# Patient Record
Sex: Female | Born: 1935 | Race: White | Hispanic: No | Marital: Married | Smoking: Former smoker
Health system: Southern US, Community
[De-identification: ages and names within clinical notes are randomized; demographics above are authoritative.]

## PROBLEM LIST (undated history)

## (undated) DIAGNOSIS — T4145XA Adverse effect of unspecified anesthetic, initial encounter: Secondary | ICD-10-CM

## (undated) DIAGNOSIS — I4891 Unspecified atrial fibrillation: Secondary | ICD-10-CM

## (undated) DIAGNOSIS — I499 Cardiac arrhythmia, unspecified: Secondary | ICD-10-CM

## (undated) DIAGNOSIS — M2041 Other hammer toe(s) (acquired), right foot: Secondary | ICD-10-CM

## (undated) DIAGNOSIS — F329 Major depressive disorder, single episode, unspecified: Secondary | ICD-10-CM

## (undated) DIAGNOSIS — M1990 Unspecified osteoarthritis, unspecified site: Secondary | ICD-10-CM

## (undated) DIAGNOSIS — H3530 Unspecified macular degeneration: Secondary | ICD-10-CM

## (undated) DIAGNOSIS — T8859XA Other complications of anesthesia, initial encounter: Secondary | ICD-10-CM

## (undated) DIAGNOSIS — I10 Essential (primary) hypertension: Secondary | ICD-10-CM

## (undated) DIAGNOSIS — Z9889 Other specified postprocedural states: Secondary | ICD-10-CM

## (undated) DIAGNOSIS — F32A Depression, unspecified: Secondary | ICD-10-CM

## (undated) DIAGNOSIS — J189 Pneumonia, unspecified organism: Secondary | ICD-10-CM

## (undated) HISTORY — PX: BUNIONECTOMY: SHX129

## (undated) HISTORY — DX: Unspecified atrial fibrillation: I48.91

## (undated) HISTORY — PX: BREAST EXCISIONAL BIOPSY: SUR124

## (undated) HISTORY — PX: ABDOMINAL HYSTERECTOMY: SHX81

## (undated) HISTORY — PX: CATARACT EXTRACTION: SUR2

## (undated) HISTORY — PX: VARICOSE VEIN SURGERY: SHX832

## (undated) HISTORY — PX: LAPAROTOMY: SHX154

---

## 1990-04-16 HISTORY — PX: BREAST EXCISIONAL BIOPSY: SUR124

## 2001-10-30 ENCOUNTER — Ambulatory Visit (HOSPITAL_COMMUNITY): Admission: RE | Admit: 2001-10-30 | Discharge: 2001-10-30 | Payer: Self-pay | Admitting: Gastroenterology

## 2004-02-08 ENCOUNTER — Ambulatory Visit (HOSPITAL_COMMUNITY): Admission: RE | Admit: 2004-02-08 | Discharge: 2004-02-08 | Payer: Self-pay | Admitting: Orthopedic Surgery

## 2004-02-08 ENCOUNTER — Ambulatory Visit (HOSPITAL_BASED_OUTPATIENT_CLINIC_OR_DEPARTMENT_OTHER): Admission: RE | Admit: 2004-02-08 | Discharge: 2004-02-08 | Payer: Self-pay | Admitting: Orthopedic Surgery

## 2004-04-26 ENCOUNTER — Encounter: Admission: RE | Admit: 2004-04-26 | Discharge: 2004-04-26 | Payer: Self-pay | Admitting: Internal Medicine

## 2005-05-16 ENCOUNTER — Encounter: Admission: RE | Admit: 2005-05-16 | Discharge: 2005-05-16 | Payer: Self-pay | Admitting: Internal Medicine

## 2006-05-22 ENCOUNTER — Encounter: Admission: RE | Admit: 2006-05-22 | Discharge: 2006-05-22 | Payer: Self-pay | Admitting: Internal Medicine

## 2007-06-19 ENCOUNTER — Encounter: Admission: RE | Admit: 2007-06-19 | Discharge: 2007-06-19 | Payer: Self-pay | Admitting: Internal Medicine

## 2008-06-29 ENCOUNTER — Encounter: Admission: RE | Admit: 2008-06-29 | Discharge: 2008-06-29 | Payer: Self-pay | Admitting: Internal Medicine

## 2009-06-30 ENCOUNTER — Encounter: Admission: RE | Admit: 2009-06-30 | Discharge: 2009-06-30 | Payer: Self-pay | Admitting: Internal Medicine

## 2010-06-26 ENCOUNTER — Other Ambulatory Visit: Payer: Self-pay | Admitting: Internal Medicine

## 2010-06-26 DIAGNOSIS — Z1231 Encounter for screening mammogram for malignant neoplasm of breast: Secondary | ICD-10-CM

## 2010-07-05 ENCOUNTER — Ambulatory Visit
Admission: RE | Admit: 2010-07-05 | Discharge: 2010-07-05 | Disposition: A | Discharge status: Home / Self Care | Payer: No Typology Code available for payment source | Source: Ambulatory Visit | Attending: Internal Medicine | Admitting: Internal Medicine

## 2010-07-05 DIAGNOSIS — Z1231 Encounter for screening mammogram for malignant neoplasm of breast: Secondary | ICD-10-CM

## 2010-09-01 NOTE — Procedures (Signed)
[facility]  Patient:    Sheena Taylor, PASCALE Visit Number: [id number] MRN: [id number]          Service Type: END Location: ENDO Attending Physician:  Nelda Marseille Dictated by:   Petra Kuba, M.D. Proc. Date: 10/30/01 Admit Date:  10/30/2001 Discharge Date: 10/30/2001   CC:         Gwen Pounds, M.D.   Procedure Report  PROCEDURE:  Colonoscopy.  INDICATION:  Bright red blood per rectum, multiple GI complaints in a patient due for colonic screening.  Consent was signed after risks, benefits, methods, and options thoroughly discussed in the office.  MEDICATIONS:  Demerol 100, Versed 10.  DESCRIPTION OF PROCEDURE:  Rectal inspection was pertinent for a large external hemorrhoid.  Digital exam was negative.  Video pediatric adjustable colonoscope was inserted and with some difficulty due to a tortuous colon, we were able to advance to the cecum which required rolling her on her back and then on her right side and various abdominal pressures.  Other than some left-sided diverticula, no abnormalities were seen on insertion.  The cecum was identified by the appendiceal orifice and the ileocecal valve.  The scope was slowly withdrawn.  The prep was adequate.  There was some liquid stool that required washing and suctioning.  On slow withdrawal through the colon, other than some left diverticula and tortuosity, no abnormalities were seen. Specifically, no polyps, masses or other abnormalities.  Once back in the rectum, the scope was retroflexed, pertinent for some internal hemorrhoids. Straight visualization confirmed the hemorrhoids.  The scope was reinserted a short ways up the left side of the colon, air was suctioned and scope removed. The patient tolerated the procedure well.  There was no obvious immediate complication.  ENDOSCOPIC DIAGNOSES: 1. External greater than internal hemorrhoids. 2. Left diverticula and tortuosity. 3.  Otherwise within normal limits to the cecum.  PLAN: 1. Repeat screening in five years unless needed sooner p.r.n. 2. Hemorrhoidal therapy with creams or possibly seeing a surgeon. 3. Will probably use Diprivan on her next colonoscopy and continue work-up    with an EGD.  Please see that dictation for other work-up and plans. Dictated by:   Petra Kuba, M.D. Attending Physician:  Nelda Marseille DD:  10/30/01 TD:  11/04/01 Job: [phone] [id number]

## 2010-09-01 NOTE — Procedures (Signed)
[facility]  Patient:    Sheena Taylor, Sheena Taylor Visit Number: [id number] MRN: [id number]          Service Type: END Location: ENDO Attending Physician:  Nelda Marseille Dictated by:   Petra Kuba, M.D. Proc. Date: 10/30/01 Admit Date:  10/30/2001 Discharge Date: 10/30/2001   CC:         Gwen Pounds, M.D.   Procedure Report  PROCEDURE:  Esophagogastroduodenoscopy.  INDICATION:  Upper tract symptoms.  Consent was signed after risks, benefits, methods, and options thoroughly discussed in the office and before any sedation.  MEDICATIONS:  No additional medicines were used for this procedure.  DESCRIPTION OF PROCEDURE:  Video endoscope was inserted by direct vision.  The proximal mid esophagus was normal.  In the distal esophagus was a small hiatal hernia.  The scope passed into the stomach and advanced through a normal antrum, normal pylorus, into a normal duodenal bulb and around the C-loop to a normal second, third, and possibly even fourth part of the duodenum.  The scope was withdrawn back to the bulb.  No abnormalities were seen.  The scope was withdrawn back to the stomach and retroflexed.  High in the cardia, the hiatal hernia was confirmed.  Fundus, angularis, lesser and greater curve were normal except for some gastritis.  Straight visualization of the stomach confirmed the above.  Went ahead and readvanced through the third or fourth part of the duodenum.  No additional findings were seen.  Air was suctioned and scope removed.  Again, a good look at the esophagus was normal. The scope was withdrawn.  The patient tolerated the procedure well.  There was no obvious immediate complication.  ENDOSCOPIC DIAGNOSES: 1. Small hiatal hernia. 2. Tortuous esophagus not mentioned above. 3. Mild gastritis. 4. Otherwise normal EGD to the third or possibly fourth part of the duodenum.  PLAN: 1. Consideration of trial of pump inhibitors,  particularly if reflux or for    any aspirin-induced problems. 2. Consideration of an ultrasound, CT, or small-bowel follow-through p.r.n. if    symptoms continue. 3. Happy to see back p.r.n. or in 2-3 months to recheck symptoms and make sure    no further work-up plans are needed. Dictated by:   Petra Kuba, M.D. Attending Physician:  Nelda Marseille DD:  10/30/01 TD:  11/04/01 Job: [phone] [id number]

## 2010-09-01 NOTE — Op Note (Signed)
[name], [name]           ACCOUNT NO.:  [id number]   MEDICAL RECORD NO.:  [id number]          PATIENT TYPE:  AMB   LOCATION:  DSC                          FACILITY:  [facility]   PHYSICIAN:  Leonides Grills, M.D.     DATE OF BIRTH:  [birth year]   DATE OF PROCEDURE:  02/08/2004  DATE OF DISCHARGE:                                 OPERATIVE REPORT   PREOPERATIVE DIAGNOSES:  1.  Right second metatarsalgia.  2.  Right second hammertoe.   POSTOPERATIVE DIAGNOSES:  1.  Right second metatarsalgia.  2.  Right second hammertoe.   OPERATION PERFORMED:  1.  Right second Weil metatarsal shortening osteotomy.  2.  Right second toe amputation through metatarsophalangeal joint.  3.  Stress x-rays right foot.   SURGEON:  Leonides Grills, M.D.   ASSISTANT:  Lianne Cure, P.A.   ANESTHESIA:  General endotracheal tube. Popliteal block.   ESTIMATED BLOOD LOSS:  Minimal.   TOURNIQUET TIME:  Approximately half hour.   COMPLICATIONS:  None.  /DISPOSITION/>  Stable to PR.   INDICATIONS FOR PROCEDURE:  The patient is a 76 year old female with  longstanding forefoot pain as well as previous second toe reconstructive  procedure which has left herb with a dorsiflexion varus deformity to toe  that was rubbing in her shoe and interfering with her life.  After  explanation of the procedure, reconstruction versus amputation, the patient  has chosen amputation of the second toe with the above shortening metatarsal  osteotomy.  The patient  has consented for the above procedure.  All risks  which include infection, neurovascular injury, nonunion, malunion, hardware  irritation, hardware failure, persistent pain, worsening pain, were all  explained, questions were encouraged and answered.   DESCRIPTION OF PROCEDURE:  The patient was brought to the operating room and  placed in supine position after adequate general endotracheal tube  anesthesia was administered as well as Ancef 1 g IV piggyback and  a  popliteal block was done as well.  The right lower extremity was then  prepped and draped in a sterile manner over a proximally placed thigh  tourniquet.  Limb was gravity exsanguinated.  Tourniquet was elevated to 290  mmHg.  A racquet type incision was then made around the second toe with the  handle of the racquet so to speak, extended dorsally midline.  Once the toe  was dissected out, this was then amputated through the metatarsophalangeal  joint.  We then with a stack blade sagittal saw, created an osteotomy  parallel to the plantar aspect of the foot.  The head was then translated  proximally, approximately 3 to 4 mm.  This was then fixed with a 12 mm long  [facility] partially threaded screws.  This had excellent purchase and  maintenance of the correction.  The redundant bone distally and dorsally was  then rounded off with a rongeur as well as there was a dorsal osteophyte as  well over the second metatarsal head.  The wound was then copiously  irrigated with normal saline.  Tourniquet was deflated. Hemostasis was  obtained.  The wound was closed with 3-0  Vicryl.  The skin was closed with 4-  0 nylon.  Sterile dressing was applied.  Hard sole shoe was applied.  The  patient was stable to the PR.  Stress x-rays were obtained in the AP and  lateral planes and showed that the osteotomy was in the proper position,  shortening was excellent as was proper placement of the fixation.  There was  no gross motion at the osteotomy site.       PB/MEDQ  D:  02/08/2004  T:  02/08/2004  Job:  [id number]

## 2011-06-22 ENCOUNTER — Other Ambulatory Visit: Payer: Self-pay | Admitting: Internal Medicine

## 2011-06-22 DIAGNOSIS — Z1231 Encounter for screening mammogram for malignant neoplasm of breast: Secondary | ICD-10-CM

## 2011-07-19 ENCOUNTER — Ambulatory Visit
Admission: RE | Admit: 2011-07-19 | Discharge: 2011-07-19 | Disposition: A | Discharge status: Home / Self Care | Payer: Medicare Other | Source: Ambulatory Visit | Attending: Internal Medicine | Admitting: Internal Medicine

## 2011-07-19 DIAGNOSIS — Z1231 Encounter for screening mammogram for malignant neoplasm of breast: Secondary | ICD-10-CM | POA: Diagnosis not present

## 2011-09-06 DIAGNOSIS — H353190 Nonexudative age-related macular degeneration, unspecified eye, stage unspecified: Secondary | ICD-10-CM | POA: Diagnosis not present

## 2012-01-11 DIAGNOSIS — Z23 Encounter for immunization: Secondary | ICD-10-CM | POA: Diagnosis not present

## 2012-06-26 DIAGNOSIS — K5730 Diverticulosis of large intestine without perforation or abscess without bleeding: Secondary | ICD-10-CM | POA: Diagnosis not present

## 2012-06-26 DIAGNOSIS — Z1211 Encounter for screening for malignant neoplasm of colon: Secondary | ICD-10-CM | POA: Diagnosis not present

## 2012-06-26 DIAGNOSIS — K620 Anal polyp: Secondary | ICD-10-CM | POA: Diagnosis not present

## 2012-06-26 DIAGNOSIS — D126 Benign neoplasm of colon, unspecified: Secondary | ICD-10-CM | POA: Diagnosis not present

## 2012-07-15 ENCOUNTER — Other Ambulatory Visit: Payer: Self-pay

## 2012-07-15 DIAGNOSIS — Z1231 Encounter for screening mammogram for malignant neoplasm of breast: Secondary | ICD-10-CM

## 2012-08-22 DIAGNOSIS — M1990 Unspecified osteoarthritis, unspecified site: Secondary | ICD-10-CM | POA: Diagnosis not present

## 2012-08-22 DIAGNOSIS — G479 Sleep disorder, unspecified: Secondary | ICD-10-CM | POA: Diagnosis not present

## 2012-08-22 DIAGNOSIS — R634 Abnormal weight loss: Secondary | ICD-10-CM | POA: Diagnosis not present

## 2012-08-22 DIAGNOSIS — E785 Hyperlipidemia, unspecified: Secondary | ICD-10-CM | POA: Diagnosis not present

## 2012-08-22 DIAGNOSIS — F329 Major depressive disorder, single episode, unspecified: Secondary | ICD-10-CM | POA: Diagnosis not present

## 2012-08-22 DIAGNOSIS — E079 Disorder of thyroid, unspecified: Secondary | ICD-10-CM | POA: Diagnosis not present

## 2012-08-22 DIAGNOSIS — F411 Generalized anxiety disorder: Secondary | ICD-10-CM | POA: Diagnosis not present

## 2012-08-22 DIAGNOSIS — F3289 Other specified depressive episodes: Secondary | ICD-10-CM | POA: Diagnosis not present

## 2012-08-22 DIAGNOSIS — I10 Essential (primary) hypertension: Secondary | ICD-10-CM | POA: Diagnosis not present

## 2012-08-25 DIAGNOSIS — E079 Disorder of thyroid, unspecified: Secondary | ICD-10-CM | POA: Diagnosis not present

## 2012-08-26 ENCOUNTER — Ambulatory Visit
Admission: RE | Admit: 2012-08-26 | Discharge: 2012-08-26 | Disposition: A | Discharge status: Home / Self Care | Payer: Medicare Other | Source: Ambulatory Visit

## 2012-08-26 DIAGNOSIS — Z1231 Encounter for screening mammogram for malignant neoplasm of breast: Secondary | ICD-10-CM | POA: Diagnosis not present

## 2012-08-28 DIAGNOSIS — Z6831 Body mass index (BMI) 31.0-31.9, adult: Secondary | ICD-10-CM | POA: Diagnosis not present

## 2012-08-28 DIAGNOSIS — E785 Hyperlipidemia, unspecified: Secondary | ICD-10-CM | POA: Diagnosis not present

## 2012-08-28 DIAGNOSIS — E079 Disorder of thyroid, unspecified: Secondary | ICD-10-CM | POA: Diagnosis not present

## 2012-08-28 DIAGNOSIS — F3289 Other specified depressive episodes: Secondary | ICD-10-CM | POA: Diagnosis not present

## 2012-08-28 DIAGNOSIS — I10 Essential (primary) hypertension: Secondary | ICD-10-CM | POA: Diagnosis not present

## 2012-08-28 DIAGNOSIS — F329 Major depressive disorder, single episode, unspecified: Secondary | ICD-10-CM | POA: Diagnosis not present

## 2012-09-11 DIAGNOSIS — E079 Disorder of thyroid, unspecified: Secondary | ICD-10-CM | POA: Diagnosis not present

## 2012-10-28 DIAGNOSIS — H2510 Age-related nuclear cataract, unspecified eye: Secondary | ICD-10-CM | POA: Diagnosis not present

## 2012-10-28 DIAGNOSIS — H25019 Cortical age-related cataract, unspecified eye: Secondary | ICD-10-CM | POA: Diagnosis not present

## 2012-10-28 DIAGNOSIS — H18419 Arcus senilis, unspecified eye: Secondary | ICD-10-CM | POA: Diagnosis not present

## 2012-10-28 DIAGNOSIS — H35369 Drusen (degenerative) of macula, unspecified eye: Secondary | ICD-10-CM | POA: Diagnosis not present

## 2012-11-28 DIAGNOSIS — E079 Disorder of thyroid, unspecified: Secondary | ICD-10-CM | POA: Diagnosis not present

## 2012-11-28 DIAGNOSIS — I10 Essential (primary) hypertension: Secondary | ICD-10-CM | POA: Diagnosis not present

## 2012-11-28 DIAGNOSIS — E785 Hyperlipidemia, unspecified: Secondary | ICD-10-CM | POA: Diagnosis not present

## 2012-12-11 DIAGNOSIS — E785 Hyperlipidemia, unspecified: Secondary | ICD-10-CM | POA: Diagnosis not present

## 2012-12-11 DIAGNOSIS — E079 Disorder of thyroid, unspecified: Secondary | ICD-10-CM | POA: Diagnosis not present

## 2012-12-11 DIAGNOSIS — G479 Sleep disorder, unspecified: Secondary | ICD-10-CM | POA: Diagnosis not present

## 2012-12-11 DIAGNOSIS — F411 Generalized anxiety disorder: Secondary | ICD-10-CM | POA: Diagnosis not present

## 2012-12-11 DIAGNOSIS — I872 Venous insufficiency (chronic) (peripheral): Secondary | ICD-10-CM | POA: Diagnosis not present

## 2012-12-11 DIAGNOSIS — H3530 Unspecified macular degeneration: Secondary | ICD-10-CM | POA: Diagnosis not present

## 2012-12-11 DIAGNOSIS — I10 Essential (primary) hypertension: Secondary | ICD-10-CM | POA: Diagnosis not present

## 2012-12-11 DIAGNOSIS — Z0000 Encounter for general adult medical examination without abnormal findings: Secondary | ICD-10-CM | POA: Diagnosis not present

## 2012-12-12 DIAGNOSIS — Z1212 Encounter for screening for malignant neoplasm of rectum: Secondary | ICD-10-CM | POA: Diagnosis not present

## 2013-01-08 DIAGNOSIS — Z23 Encounter for immunization: Secondary | ICD-10-CM | POA: Diagnosis not present

## 2013-06-16 DIAGNOSIS — Z6831 Body mass index (BMI) 31.0-31.9, adult: Secondary | ICD-10-CM | POA: Diagnosis not present

## 2013-06-16 DIAGNOSIS — E785 Hyperlipidemia, unspecified: Secondary | ICD-10-CM | POA: Diagnosis not present

## 2013-06-16 DIAGNOSIS — I10 Essential (primary) hypertension: Secondary | ICD-10-CM | POA: Diagnosis not present

## 2013-06-16 DIAGNOSIS — E079 Disorder of thyroid, unspecified: Secondary | ICD-10-CM | POA: Diagnosis not present

## 2013-06-16 DIAGNOSIS — E669 Obesity, unspecified: Secondary | ICD-10-CM | POA: Diagnosis not present

## 2013-06-16 DIAGNOSIS — M1990 Unspecified osteoarthritis, unspecified site: Secondary | ICD-10-CM | POA: Diagnosis not present

## 2013-09-16 ENCOUNTER — Other Ambulatory Visit: Payer: Self-pay

## 2013-09-16 DIAGNOSIS — Z1231 Encounter for screening mammogram for malignant neoplasm of breast: Secondary | ICD-10-CM

## 2013-09-28 ENCOUNTER — Ambulatory Visit
Admission: RE | Admit: 2013-09-28 | Discharge: 2013-09-28 | Disposition: A | Discharge status: Home / Self Care | Payer: Medicare Other | Source: Ambulatory Visit

## 2013-09-28 DIAGNOSIS — Z1231 Encounter for screening mammogram for malignant neoplasm of breast: Secondary | ICD-10-CM

## 2013-10-28 DIAGNOSIS — H2510 Age-related nuclear cataract, unspecified eye: Secondary | ICD-10-CM | POA: Diagnosis not present

## 2013-10-28 DIAGNOSIS — H25019 Cortical age-related cataract, unspecified eye: Secondary | ICD-10-CM | POA: Diagnosis not present

## 2013-10-28 DIAGNOSIS — H35369 Drusen (degenerative) of macula, unspecified eye: Secondary | ICD-10-CM | POA: Diagnosis not present

## 2013-10-28 DIAGNOSIS — H353190 Nonexudative age-related macular degeneration, unspecified eye, stage unspecified: Secondary | ICD-10-CM | POA: Diagnosis not present

## 2013-12-10 DIAGNOSIS — R82998 Other abnormal findings in urine: Secondary | ICD-10-CM | POA: Diagnosis not present

## 2013-12-10 DIAGNOSIS — I10 Essential (primary) hypertension: Secondary | ICD-10-CM | POA: Diagnosis not present

## 2013-12-10 DIAGNOSIS — E785 Hyperlipidemia, unspecified: Secondary | ICD-10-CM | POA: Diagnosis not present

## 2013-12-17 DIAGNOSIS — IMO0002 Reserved for concepts with insufficient information to code with codable children: Secondary | ICD-10-CM | POA: Diagnosis not present

## 2013-12-17 DIAGNOSIS — Z23 Encounter for immunization: Secondary | ICD-10-CM | POA: Diagnosis not present

## 2013-12-17 DIAGNOSIS — G479 Sleep disorder, unspecified: Secondary | ICD-10-CM | POA: Diagnosis not present

## 2013-12-17 DIAGNOSIS — E669 Obesity, unspecified: Secondary | ICD-10-CM | POA: Diagnosis not present

## 2013-12-17 DIAGNOSIS — Z0000 Encounter for general adult medical examination without abnormal findings: Secondary | ICD-10-CM | POA: Diagnosis not present

## 2013-12-17 DIAGNOSIS — I10 Essential (primary) hypertension: Secondary | ICD-10-CM | POA: Diagnosis not present

## 2013-12-17 DIAGNOSIS — Z1331 Encounter for screening for depression: Secondary | ICD-10-CM | POA: Diagnosis not present

## 2013-12-17 DIAGNOSIS — M1710 Unilateral primary osteoarthritis, unspecified knee: Secondary | ICD-10-CM | POA: Diagnosis not present

## 2013-12-17 DIAGNOSIS — E079 Disorder of thyroid, unspecified: Secondary | ICD-10-CM | POA: Diagnosis not present

## 2013-12-17 DIAGNOSIS — M25569 Pain in unspecified knee: Secondary | ICD-10-CM | POA: Diagnosis not present

## 2013-12-17 DIAGNOSIS — E785 Hyperlipidemia, unspecified: Secondary | ICD-10-CM | POA: Diagnosis not present

## 2013-12-18 DIAGNOSIS — Z1212 Encounter for screening for malignant neoplasm of rectum: Secondary | ICD-10-CM | POA: Diagnosis not present

## 2013-12-31 DIAGNOSIS — Z23 Encounter for immunization: Secondary | ICD-10-CM | POA: Diagnosis not present

## 2014-02-11 DIAGNOSIS — K2270 Barrett's esophagus without dysplasia: Secondary | ICD-10-CM | POA: Diagnosis not present

## 2014-06-15 DIAGNOSIS — M25561 Pain in right knee: Secondary | ICD-10-CM | POA: Diagnosis not present

## 2014-06-15 DIAGNOSIS — E785 Hyperlipidemia, unspecified: Secondary | ICD-10-CM | POA: Diagnosis not present

## 2014-06-15 DIAGNOSIS — F329 Major depressive disorder, single episode, unspecified: Secondary | ICD-10-CM | POA: Diagnosis not present

## 2014-06-15 DIAGNOSIS — I10 Essential (primary) hypertension: Secondary | ICD-10-CM | POA: Diagnosis not present

## 2014-06-15 DIAGNOSIS — R946 Abnormal results of thyroid function studies: Secondary | ICD-10-CM | POA: Diagnosis not present

## 2014-06-15 DIAGNOSIS — Z1389 Encounter for screening for other disorder: Secondary | ICD-10-CM | POA: Diagnosis not present

## 2014-06-15 DIAGNOSIS — E669 Obesity, unspecified: Secondary | ICD-10-CM | POA: Diagnosis not present

## 2014-06-15 DIAGNOSIS — H3530 Unspecified macular degeneration: Secondary | ICD-10-CM | POA: Diagnosis not present

## 2014-06-15 DIAGNOSIS — Z6830 Body mass index (BMI) 30.0-30.9, adult: Secondary | ICD-10-CM | POA: Diagnosis not present

## 2014-06-15 DIAGNOSIS — M1990 Unspecified osteoarthritis, unspecified site: Secondary | ICD-10-CM | POA: Diagnosis not present

## 2014-06-18 DIAGNOSIS — Z6830 Body mass index (BMI) 30.0-30.9, adult: Secondary | ICD-10-CM | POA: Diagnosis not present

## 2014-06-18 DIAGNOSIS — M25561 Pain in right knee: Secondary | ICD-10-CM | POA: Diagnosis not present

## 2014-06-18 DIAGNOSIS — E669 Obesity, unspecified: Secondary | ICD-10-CM | POA: Diagnosis not present

## 2014-06-18 DIAGNOSIS — I10 Essential (primary) hypertension: Secondary | ICD-10-CM | POA: Diagnosis not present

## 2014-06-18 DIAGNOSIS — F329 Major depressive disorder, single episode, unspecified: Secondary | ICD-10-CM | POA: Diagnosis not present

## 2014-06-18 DIAGNOSIS — Z1389 Encounter for screening for other disorder: Secondary | ICD-10-CM | POA: Diagnosis not present

## 2014-06-18 DIAGNOSIS — E785 Hyperlipidemia, unspecified: Secondary | ICD-10-CM | POA: Diagnosis not present

## 2014-06-18 DIAGNOSIS — R946 Abnormal results of thyroid function studies: Secondary | ICD-10-CM | POA: Diagnosis not present

## 2014-06-18 DIAGNOSIS — M1990 Unspecified osteoarthritis, unspecified site: Secondary | ICD-10-CM | POA: Diagnosis not present

## 2014-06-18 DIAGNOSIS — H3530 Unspecified macular degeneration: Secondary | ICD-10-CM | POA: Diagnosis not present

## 2014-06-30 DIAGNOSIS — M179 Osteoarthritis of knee, unspecified: Secondary | ICD-10-CM | POA: Diagnosis not present

## 2014-06-30 DIAGNOSIS — I10 Essential (primary) hypertension: Secondary | ICD-10-CM | POA: Diagnosis not present

## 2014-06-30 DIAGNOSIS — Z6830 Body mass index (BMI) 30.0-30.9, adult: Secondary | ICD-10-CM | POA: Diagnosis not present

## 2014-07-19 DIAGNOSIS — M179 Osteoarthritis of knee, unspecified: Secondary | ICD-10-CM | POA: Diagnosis not present

## 2014-07-19 DIAGNOSIS — Z6830 Body mass index (BMI) 30.0-30.9, adult: Secondary | ICD-10-CM | POA: Diagnosis not present

## 2014-07-19 DIAGNOSIS — I10 Essential (primary) hypertension: Secondary | ICD-10-CM | POA: Diagnosis not present

## 2014-07-19 DIAGNOSIS — E785 Hyperlipidemia, unspecified: Secondary | ICD-10-CM | POA: Diagnosis not present

## 2014-09-28 DIAGNOSIS — Z6829 Body mass index (BMI) 29.0-29.9, adult: Secondary | ICD-10-CM | POA: Diagnosis not present

## 2014-09-28 DIAGNOSIS — M179 Osteoarthritis of knee, unspecified: Secondary | ICD-10-CM | POA: Diagnosis not present

## 2014-09-28 DIAGNOSIS — I10 Essential (primary) hypertension: Secondary | ICD-10-CM | POA: Diagnosis not present

## 2014-10-11 ENCOUNTER — Other Ambulatory Visit: Payer: Self-pay

## 2014-10-11 DIAGNOSIS — Z1231 Encounter for screening mammogram for malignant neoplasm of breast: Secondary | ICD-10-CM

## 2014-10-13 DIAGNOSIS — M1711 Unilateral primary osteoarthritis, right knee: Secondary | ICD-10-CM | POA: Diagnosis not present

## 2014-10-13 DIAGNOSIS — M25561 Pain in right knee: Secondary | ICD-10-CM | POA: Diagnosis not present

## 2014-10-29 DIAGNOSIS — J209 Acute bronchitis, unspecified: Secondary | ICD-10-CM | POA: Diagnosis not present

## 2014-10-29 DIAGNOSIS — J181 Lobar pneumonia, unspecified organism: Secondary | ICD-10-CM | POA: Diagnosis not present

## 2014-10-29 DIAGNOSIS — Z6829 Body mass index (BMI) 29.0-29.9, adult: Secondary | ICD-10-CM | POA: Diagnosis not present

## 2014-11-16 DIAGNOSIS — J189 Pneumonia, unspecified organism: Secondary | ICD-10-CM | POA: Diagnosis not present

## 2014-11-17 ENCOUNTER — Ambulatory Visit
Admission: RE | Admit: 2014-11-17 | Discharge: 2014-11-17 | Disposition: A | Discharge status: Home / Self Care | Payer: Medicare Other | Source: Ambulatory Visit

## 2014-11-17 DIAGNOSIS — Z1231 Encounter for screening mammogram for malignant neoplasm of breast: Secondary | ICD-10-CM

## 2014-11-19 DIAGNOSIS — M25561 Pain in right knee: Secondary | ICD-10-CM | POA: Diagnosis not present

## 2014-12-08 ENCOUNTER — Ambulatory Visit
Admission: RE | Admit: 2014-12-08 | Discharge: 2014-12-08 | Disposition: A | Discharge status: Home / Self Care | Payer: Medicare Other | Source: Ambulatory Visit

## 2014-12-08 DIAGNOSIS — Z1231 Encounter for screening mammogram for malignant neoplasm of breast: Secondary | ICD-10-CM | POA: Diagnosis not present

## 2014-12-22 DIAGNOSIS — N390 Urinary tract infection, site not specified: Secondary | ICD-10-CM | POA: Diagnosis not present

## 2014-12-22 DIAGNOSIS — I10 Essential (primary) hypertension: Secondary | ICD-10-CM | POA: Diagnosis not present

## 2014-12-22 DIAGNOSIS — E785 Hyperlipidemia, unspecified: Secondary | ICD-10-CM | POA: Diagnosis not present

## 2014-12-22 DIAGNOSIS — Z008 Encounter for other general examination: Secondary | ICD-10-CM | POA: Diagnosis not present

## 2014-12-23 DIAGNOSIS — H25013 Cortical age-related cataract, bilateral: Secondary | ICD-10-CM | POA: Diagnosis not present

## 2014-12-23 DIAGNOSIS — H3531 Nonexudative age-related macular degeneration: Secondary | ICD-10-CM | POA: Diagnosis not present

## 2014-12-23 DIAGNOSIS — H35363 Drusen (degenerative) of macula, bilateral: Secondary | ICD-10-CM | POA: Diagnosis not present

## 2014-12-23 DIAGNOSIS — H18413 Arcus senilis, bilateral: Secondary | ICD-10-CM | POA: Diagnosis not present

## 2014-12-23 DIAGNOSIS — H43813 Vitreous degeneration, bilateral: Secondary | ICD-10-CM | POA: Diagnosis not present

## 2014-12-23 DIAGNOSIS — H2513 Age-related nuclear cataract, bilateral: Secondary | ICD-10-CM | POA: Diagnosis not present

## 2014-12-28 DIAGNOSIS — R634 Abnormal weight loss: Secondary | ICD-10-CM | POA: Diagnosis not present

## 2014-12-28 DIAGNOSIS — M179 Osteoarthritis of knee, unspecified: Secondary | ICD-10-CM | POA: Diagnosis not present

## 2014-12-28 DIAGNOSIS — Z23 Encounter for immunization: Secondary | ICD-10-CM | POA: Diagnosis not present

## 2014-12-28 DIAGNOSIS — F329 Major depressive disorder, single episode, unspecified: Secondary | ICD-10-CM | POA: Diagnosis not present

## 2014-12-28 DIAGNOSIS — Z6829 Body mass index (BMI) 29.0-29.9, adult: Secondary | ICD-10-CM | POA: Diagnosis not present

## 2014-12-28 DIAGNOSIS — E785 Hyperlipidemia, unspecified: Secondary | ICD-10-CM | POA: Diagnosis not present

## 2014-12-28 DIAGNOSIS — N6019 Diffuse cystic mastopathy of unspecified breast: Secondary | ICD-10-CM | POA: Diagnosis not present

## 2014-12-28 DIAGNOSIS — R946 Abnormal results of thyroid function studies: Secondary | ICD-10-CM | POA: Diagnosis not present

## 2014-12-28 DIAGNOSIS — G479 Sleep disorder, unspecified: Secondary | ICD-10-CM | POA: Diagnosis not present

## 2014-12-28 DIAGNOSIS — I10 Essential (primary) hypertension: Secondary | ICD-10-CM | POA: Diagnosis not present

## 2014-12-28 DIAGNOSIS — Z0000 Encounter for general adult medical examination without abnormal findings: Secondary | ICD-10-CM | POA: Diagnosis not present

## 2014-12-28 DIAGNOSIS — F419 Anxiety disorder, unspecified: Secondary | ICD-10-CM | POA: Diagnosis not present

## 2015-01-04 ENCOUNTER — Inpatient Hospital Stay (HOSPITAL_COMMUNITY): Admission: RE | Admit: 2015-01-04 | Payer: Medicare Other | Source: Ambulatory Visit

## 2015-01-05 NOTE — H&P (Signed)
TOTAL KNEE ADMISSION H&P  Patient is being admitted for right total knee arthroplasty.  Subjective:  Chief Complaint:      Right knee primary OA / pain.  HPI: Sheena Taylor, 80 y.o. female, has a history of pain and functional disability in the right knee due to arthritis and has failed non-surgical conservative treatments for greater than 12 weeks to include NSAID's and/or analgesics, corticosteriod injections and activity modification.  Onset of symptoms was gradual, starting 1+ years ago with gradually worsening course since that time. The patient noted no past surgery on the right knee(s).  Patient currently rates pain in the right knee(s) at 7 out of 10 with activity. Patient has worsening of pain with activity and weight bearing, pain that interferes with activities of daily living, pain with passive range of motion, crepitus and joint swelling.  Patient has evidence of periarticular osteophytes and joint space narrowing by imaging studies.  There is no active infection.  Risks, benefits and expectations were discussed with the patient.  Risks including but not limited to the risk of anesthesia, blood clots, nerve damage, blood vessel damage, failure of the prosthesis, infection and up to and including death.  Patient understand the risks, benefits and expectations and wishes to proceed with surgery.   PCP: Precious Reel, MD  D/C Plans:      Home with HHPT  Post-op Meds:       No Rx given   Tranexamic Acid:      To be given - IV  Decadron:      Is to be given  FYI:     ASA post-op  Dilaudid PO & IV     Past Medical History  Diagnosis Date  . Complication of anesthesia     Dr. Winfred Leeds had problems putting spinal in approximately 10-15 years ago and had to have general   . History of laparotomy     for ectopic pregnancy  . H/O vein stripping     bilateral  . Hammer toe of right foot     with this had amputation right 2nd toe  . Hypertension   . Depression   . Macular  degeneration   . Arthritis   . S/P bunionectomy     bilateral and hammer toe-correction    Past Surgical History  Procedure Laterality Date  . Abdominal hysterectomy      partial  . Bunionectomy      bilateral with hammer toe and amputation 2nd right toe with this  . Laparotomy      for ruptured ectopic pregnancy  . Varicose vein surgery      bilateral    No prescriptions prior to admission   Allergies  Allergen Reactions  . Codeine Nausea And Vomiting    "goes crazy"    Social History  Substance Use Topics  . Smoking status: Current Some Day Smoker    Types: Cigarettes  . Smokeless tobacco: Not on file     Comment: smokes about once a week when having a mixed drink   . Alcohol Use: Yes     Comment: occassionally       Review of Systems  Constitutional: Negative.   HENT: Negative.   Eyes: Negative.   Respiratory: Negative.   Cardiovascular: Negative.   Gastrointestinal: Negative.   Genitourinary: Negative.   Musculoskeletal: Positive for joint pain.  Skin: Negative.   Neurological: Negative.   Endo/Heme/Allergies: Negative.   Psychiatric/Behavioral: Positive for depression.    Objective:  Physical  Exam  Constitutional: She is oriented to person, place, and time. She appears well-developed and well-nourished.  HENT:  Head: Normocephalic and atraumatic.  Eyes: Pupils are equal, round, and reactive to light.  Neck: Neck supple. No JVD present. No tracheal deviation present. No thyromegaly present.  Cardiovascular: Normal rate, regular rhythm and intact distal pulses.   Murmur heard. Respiratory: Effort normal and breath sounds normal. No stridor. No respiratory distress. She has no wheezes.  GI: Soft. There is no tenderness. There is no guarding.  Musculoskeletal:       Right knee: She exhibits decreased range of motion, swelling and bony tenderness. She exhibits no ecchymosis, no deformity, no laceration and no erythema. Tenderness found.  Lymphadenopathy:     She has no cervical adenopathy.  Neurological: She is alert and oriented to person, place, and time.  Skin: Skin is warm and dry.  Psychiatric: She has a normal mood and affect.     Imaging Review Plain radiographs demonstrate severe degenerative joint disease of the right knee(s). The overall alignment is neutral. The bone quality appears to be good for age and reported activity level.  Assessment/Plan:  End stage arthritis, right knee   The patient history, physical examination, clinical judgment of the provider and imaging studies are consistent with end stage degenerative joint disease of the right knee(s) and total knee arthroplasty is deemed medically necessary. The treatment options including medical management, injection therapy arthroscopy and arthroplasty were discussed at length. The risks and benefits of total knee arthroplasty were presented and reviewed. The risks due to aseptic loosening, infection, stiffness, patella tracking problems, thromboembolic complications and other imponderables were discussed. The patient acknowledged the explanation, agreed to proceed with the plan and consent was signed. Patient is being admitted for inpatient treatment for surgery, pain control, PT, OT, prophylactic antibiotics, VTE prophylaxis, progressive ambulation and ADL's and discharge planning. The patient is planning to be discharged home with home health services.     [place] Pugh Mickala Laton   PA-C  01/15/2015, 5:58 PM

## 2015-01-05 NOTE — Patient Instructions (Signed)
Sheena Taylor  01/05/2015   Your procedure is scheduled on: Monday 01/17/2015  Report to [facility]  Entrance take [place]  elevators to 3rd floor to  [place] at 0800 AM.  Call this number if you have problems the morning of surgery [phone]   Remember: ONLY 1 PERSON MAY GO WITH YOU TO SHORT STAY TO GET  READY MORNING OF [address].  Do not eat food or drink liquids :After Midnight.     Take these medicines the morning of surgery with A SIP OF WATER: none  DO NOT TAKE ANY DIABETIC MEDICATIONS DAY OF YOUR SURGERY                               You may not have any metal on your body including hair pins and              piercings  Do not wear jewelry, make-up, lotions, powders or perfumes, deodorant             Do not wear nail polish.  Do not shave  48 hours prior to surgery.              Men may shave face and neck.   Do not bring valuables to the hospital. [place].  Contacts, dentures or bridgework may not be worn into surgery.  Leave suitcase in the car. After surgery it may be brought to your room.     Patients discharged the day of surgery will not be allowed to drive home.  Name and phone number of your driver:  Special Instructions: N/A              Please read over the following fact sheets you were given: _____________________________________________________________________             [facility] - Preparing for Surgery Before surgery, you can play an important role.  Because skin is not sterile, your skin needs to be as free of germs as possible.  You can reduce the number of germs on your skin by washing with CHG (chlorahexidine gluconate) soap before surgery.  CHG is an antiseptic cleaner which kills germs and bonds with the skin to continue killing germs even after washing. Please DO NOT use if you have an allergy to CHG or antibacterial soaps.  If your skin becomes  reddened/irritated stop using the CHG and inform your nurse when you arrive at Short Stay. Do not shave (including legs and underarms) for at least 48 hours prior to the first CHG shower.  You may shave your face/neck. Please follow these instructions carefully:  1.  Shower with CHG Soap the night before surgery and the  morning of Surgery.  2.  If you choose to wash your hair, wash your hair first as usual with your  normal  shampoo.  3.  After you shampoo, rinse your hair and body thoroughly to remove the  shampoo.                           4.  Use CHG as you would any other liquid soap.  You can apply chg directly  to the skin and wash  Gently with a scrungie or clean washcloth.  5.  Apply the CHG Soap to your body ONLY FROM THE NECK DOWN.   Do not use on face/ open                           Wound or open sores. Avoid contact with eyes, ears mouth and genitals (private parts).                       Wash face,  Genitals (private parts) with your normal soap.             6.  Wash thoroughly, paying special attention to the area where your surgery  will be performed.  7.  Thoroughly rinse your body with warm water from the neck down.  8.  DO NOT shower/wash with your normal soap after using and rinsing off  the CHG Soap.                9.  Pat yourself dry with a clean towel.            10.  Wear clean pajamas.            11.  Place clean sheets on your bed the night of your first shower and do not  sleep with pets. Day of Surgery : Do not apply any lotions/deodorants the morning of surgery.  Please wear clean clothes to the hospital/surgery center.  FAILURE TO FOLLOW THESE INSTRUCTIONS MAY RESULT IN THE CANCELLATION OF YOUR SURGERY PATIENT SIGNATURE_________________________________  NURSE SIGNATURE__________________________________  ________________________________________________________________________   [name]  An incentive spirometer is a tool that  can help keep your lungs clear and active. This tool measures how well you are filling your lungs with each breath. Taking long deep breaths may help reverse or decrease the chance of developing breathing (pulmonary) problems (especially infection) following:  A long period of time when you are unable to move or be active. BEFORE THE PROCEDURE   If the spirometer includes an indicator to show your best effort, your nurse or respiratory therapist will set it to a desired goal.  If possible, sit up straight or lean slightly forward. Try not to slouch.  Hold the incentive spirometer in an upright position. INSTRUCTIONS FOR USE   Sit on the edge of your bed if possible, or sit up as far as you can in bed or on a chair.  Hold the incentive spirometer in an upright position.  Breathe out normally.  Place the mouthpiece in your mouth and seal your lips tightly around it.  Breathe in slowly and as deeply as possible, raising the piston or the ball toward the top of the column.  Hold your breath for 3-5 seconds or for as long as possible. Allow the piston or ball to fall to the bottom of the column.  Remove the mouthpiece from your mouth and breathe out normally.  Rest for a few seconds and repeat Steps 1 through 7 at least 10 times every 1-2 hours when you are awake. Take your time and take a few normal breaths between deep breaths.  The spirometer may include an indicator to show your best effort. Use the indicator as a goal to work toward during each repetition.  After each set of 10 deep breaths, practice coughing to be sure your lungs are clear. If you have an incision (the cut made at the time of surgery),  support your incision when coughing by placing a pillow or rolled up towels firmly against it. Once you are able to get out of bed, walk around indoors and cough well. You may stop using the incentive spirometer when instructed by your caregiver.  RISKS AND COMPLICATIONS  Take your  time so you do not get dizzy or light-headed.  If you are in pain, you may need to take or ask for pain medication before doing incentive spirometry. It is harder to take a deep breath if you are having pain. AFTER USE  Rest and breathe slowly and easily.  It can be helpful to keep track of a log of your progress. Your caregiver can provide you with a simple table to help with this. If you are using the spirometer at home, follow these instructions: [place] IF:   You are having difficultly using the spirometer.  You have trouble using the spirometer as often as instructed.  Your pain medication is not giving enough relief while using the spirometer.  You develop fever of 100.5 F (38.1 C) or higher. SEEK IMMEDIATE MEDICAL CARE IF:   You cough up bloody sputum that had not been present before.  You develop fever of 102 F (38.9 C) or greater.  You develop worsening pain at or near the incision site. MAKE SURE YOU:   Understand these instructions.  Will watch your condition.  Will get help right away if you are not doing well or get worse. Document Released: 08/13/2006 Document Revised: 06/25/2011 Document Reviewed: 10/14/2006 Exit[employer] Patient Information 2014 ExitCare, [place].   ________________________________________________________________________  WHAT IS A BLOOD TRANSFUSION? Blood Transfusion Information  A transfusion is the replacement of blood or some of its parts. Blood is made up of multiple cells which provide different functions.  Red blood cells carry oxygen and are used for blood loss replacement.  White blood cells fight against infection.  Platelets control bleeding.  Plasma helps clot blood.  Other blood products are available for specialized needs, such as hemophilia or other clotting disorders. BEFORE THE TRANSFUSION  Who gives blood for transfusions?   Healthy volunteers who are fully evaluated to make sure their blood is safe. This  is blood bank blood. Transfusion therapy is the safest it has ever been in the practice of medicine. Before blood is taken from a donor, a complete history is taken to make sure that person has no history of diseases nor engages in risky social behavior (examples are intravenous drug use or sexual activity with multiple partners). The donor's travel history is screened to minimize risk of transmitting infections, such as malaria. The donated blood is tested for signs of infectious diseases, such as HIV and hepatitis. The blood is then tested to be sure it is compatible with you in order to minimize the chance of a transfusion reaction. If you or a relative donates blood, this is often done in anticipation of surgery and is not appropriate for emergency situations. It takes many days to process the donated blood. RISKS AND COMPLICATIONS Although transfusion therapy is very safe and saves many lives, the main dangers of transfusion include:   Getting an infectious disease.  Developing a transfusion reaction. This is an allergic reaction to something in the blood you were given. Every precaution is taken to prevent this. The decision to have a blood transfusion has been considered carefully by your caregiver before blood is given. Blood is not given unless the benefits outweigh the risks. AFTER THE TRANSFUSION  Right after receiving a blood transfusion, you will usually feel much better and more energetic. This is especially true if your red blood cells have gotten low (anemic). The transfusion raises the level of the red blood cells which carry oxygen, and this usually causes an energy increase.  The nurse administering the transfusion will monitor you carefully for complications. HOME CARE INSTRUCTIONS  No special instructions are needed after a transfusion. You may find your energy is better. Speak with your caregiver about any limitations on activity for underlying diseases you may have. SEEK  MEDICAL CARE IF:   Your condition is not improving after your transfusion.  You develop redness or irritation at the intravenous (IV) site. SEEK IMMEDIATE MEDICAL CARE IF:  Any of the following symptoms occur over the next 12 hours:  Shaking chills.  You have a temperature by mouth above 102 F (38.9 C), not controlled by medicine.  Chest, back, or muscle pain.  People around you feel you are not acting correctly or are confused.  Shortness of breath or difficulty breathing.  Dizziness and fainting.  You get a rash or develop hives.  You have a decrease in urine output.  Your urine turns a dark color or changes to pink, red, or brown. Any of the following symptoms occur over the next 10 days:  You have a temperature by mouth above 102 F (38.9 C), not controlled by medicine.  Shortness of breath.  Weakness after normal activity.  The white part of the eye turns yellow (jaundice).  You have a decrease in the amount of urine or are urinating less often.  Your urine turns a dark color or changes to pink, red, or brown. Document Released: 03/30/2000 Document Revised: 06/25/2011 Document Reviewed: 11/17/2007 [facility] Patient Information 2014 [place], [place].  _______________________________________________________________________

## 2015-01-10 ENCOUNTER — Encounter (INDEPENDENT_AMBULATORY_CARE_PROVIDER_SITE_OTHER): Payer: Self-pay

## 2015-01-10 ENCOUNTER — Encounter (HOSPITAL_COMMUNITY)
Admission: RE | Admit: 2015-01-10 | Discharge: 2015-01-10 | Disposition: A | Discharge status: Home / Self Care | Payer: Medicare Other | Source: Ambulatory Visit | Attending: Orthopedic Surgery | Admitting: Orthopedic Surgery

## 2015-01-10 ENCOUNTER — Encounter (HOSPITAL_COMMUNITY): Payer: Self-pay

## 2015-01-10 DIAGNOSIS — M179 Osteoarthritis of knee, unspecified: Secondary | ICD-10-CM | POA: Diagnosis not present

## 2015-01-10 DIAGNOSIS — Z01818 Encounter for other preprocedural examination: Secondary | ICD-10-CM | POA: Insufficient documentation

## 2015-01-10 HISTORY — DX: Other specified postprocedural states: Z98.890

## 2015-01-10 HISTORY — DX: Unspecified macular degeneration: H35.30

## 2015-01-10 HISTORY — DX: Essential (primary) hypertension: I10

## 2015-01-10 HISTORY — DX: Adverse effect of unspecified anesthetic, initial encounter: T41.45XA

## 2015-01-10 HISTORY — DX: Major depressive disorder, single episode, unspecified: F32.9

## 2015-01-10 HISTORY — DX: Other complications of anesthesia, initial encounter: T88.59XA

## 2015-01-10 HISTORY — DX: Unspecified osteoarthritis, unspecified site: M19.90

## 2015-01-10 HISTORY — DX: Other hammer toe(s) (acquired), right foot: M20.41

## 2015-01-10 HISTORY — DX: Depression, unspecified: F32.A

## 2015-01-10 LAB — URINALYSIS, ROUTINE W REFLEX MICROSCOPIC
GLUCOSE, UA: NEGATIVE mg/dL
Hgb urine dipstick: NEGATIVE
KETONES UR: NEGATIVE mg/dL
NITRITE: NEGATIVE
PH: 5 (ref 5.0–8.0)
PROTEIN: NEGATIVE mg/dL
Specific Gravity, Urine: 1.018 (ref 1.005–1.030)
Urobilinogen, UA: 1 mg/dL (ref 0.0–1.0)

## 2015-01-10 LAB — APTT: APTT: 25 s (ref 24–37)

## 2015-01-10 LAB — SURGICAL PCR SCREEN
MRSA, PCR: NEGATIVE
STAPHYLOCOCCUS AUREUS: NEGATIVE

## 2015-01-10 LAB — URINE MICROSCOPIC-ADD ON

## 2015-01-10 LAB — PROTIME-INR
INR: 0.96 (ref 0.00–1.49)
PROTHROMBIN TIME: 13 s (ref 11.6–15.2)

## 2015-01-10 LAB — ABO/RH: ABO/RH(D): A NEG

## 2015-01-10 NOTE — Progress Notes (Addendum)
12/22/2014-Labs (CBC, CMP, LIPID,TSH, U/A, and Micro  U/A, and urine culture) on chart from [facility]. 12/28/2014-EKG from [facility] on chart. 11/16/2014-CXR from [facility] on chart.

## 2015-01-17 ENCOUNTER — Encounter (HOSPITAL_COMMUNITY): Payer: Self-pay | Admitting: *Deleted

## 2015-01-17 ENCOUNTER — Inpatient Hospital Stay (HOSPITAL_COMMUNITY): Payer: Medicare Other | Admitting: Registered Nurse

## 2015-01-17 ENCOUNTER — Inpatient Hospital Stay (HOSPITAL_COMMUNITY)
Admission: RE | Admit: 2015-01-17 | Discharge: 2015-01-19 | DRG: 470 | Disposition: A | Discharge status: Home Health | Payer: Medicare Other | Source: Ambulatory Visit | Attending: Orthopedic Surgery | Admitting: Orthopedic Surgery

## 2015-01-17 ENCOUNTER — Encounter (HOSPITAL_COMMUNITY)
Admission: RE | Disposition: A | Discharge status: Home Health | Payer: Self-pay | Source: Ambulatory Visit | Attending: Orthopedic Surgery

## 2015-01-17 DIAGNOSIS — I10 Essential (primary) hypertension: Secondary | ICD-10-CM | POA: Diagnosis present

## 2015-01-17 DIAGNOSIS — Z96651 Presence of right artificial knee joint: Secondary | ICD-10-CM

## 2015-01-17 DIAGNOSIS — M25561 Pain in right knee: Secondary | ICD-10-CM | POA: Diagnosis not present

## 2015-01-17 DIAGNOSIS — H3530 Unspecified macular degeneration: Secondary | ICD-10-CM | POA: Diagnosis present

## 2015-01-17 DIAGNOSIS — F329 Major depressive disorder, single episode, unspecified: Secondary | ICD-10-CM | POA: Diagnosis present

## 2015-01-17 DIAGNOSIS — Z6830 Body mass index (BMI) 30.0-30.9, adult: Secondary | ICD-10-CM

## 2015-01-17 DIAGNOSIS — M179 Osteoarthritis of knee, unspecified: Secondary | ICD-10-CM | POA: Diagnosis not present

## 2015-01-17 DIAGNOSIS — F17210 Nicotine dependence, cigarettes, uncomplicated: Secondary | ICD-10-CM | POA: Diagnosis present

## 2015-01-17 DIAGNOSIS — Z1212 Encounter for screening for malignant neoplasm of rectum: Secondary | ICD-10-CM | POA: Diagnosis not present

## 2015-01-17 DIAGNOSIS — E669 Obesity, unspecified: Secondary | ICD-10-CM | POA: Diagnosis present

## 2015-01-17 DIAGNOSIS — M1711 Unilateral primary osteoarthritis, right knee: Secondary | ICD-10-CM | POA: Diagnosis not present

## 2015-01-17 DIAGNOSIS — M659 Synovitis and tenosynovitis, unspecified: Secondary | ICD-10-CM | POA: Diagnosis present

## 2015-01-17 DIAGNOSIS — Z01812 Encounter for preprocedural laboratory examination: Secondary | ICD-10-CM | POA: Diagnosis not present

## 2015-01-17 DIAGNOSIS — Z96659 Presence of unspecified artificial knee joint: Secondary | ICD-10-CM

## 2015-01-17 HISTORY — DX: Presence of right artificial knee joint: Z96.651

## 2015-01-17 HISTORY — PX: TOTAL KNEE ARTHROPLASTY: SHX125

## 2015-01-17 LAB — TYPE AND SCREEN
ABO/RH(D): A NEG
ANTIBODY SCREEN: NEGATIVE

## 2015-01-17 SURGERY — ARTHROPLASTY, KNEE, TOTAL
Anesthesia: Spinal | Site: Knee | Laterality: Right

## 2015-01-17 MED ORDER — FERROUS SULFATE 325 (65 FE) MG PO TABS
325.0000 mg | ORAL_TABLET | Freq: Three times a day (TID) | ORAL | Status: DC
  Administered 2015-01-18 – 2015-01-19 (×3): 325 mg via ORAL
  Filled 2015-01-17 (×8): qty 1

## 2015-01-17 MED ORDER — METOCLOPRAMIDE HCL 10 MG PO TABS: 5.0000 mg | ORAL_TABLET | Freq: Three times a day (TID) | ORAL | Status: DC | PRN

## 2015-01-17 MED ORDER — METHOCARBAMOL 1000 MG/10ML IJ SOLN
500.0000 mg | Freq: Four times a day (QID) | INTRAVENOUS | Status: DC | PRN
  Filled 2015-01-17: qty 5

## 2015-01-17 MED ORDER — MAGNESIUM CITRATE PO SOLN: 1.0000 | Freq: Once | ORAL | Status: DC | PRN

## 2015-01-17 MED ORDER — DIPHENHYDRAMINE HCL 25 MG PO CAPS: 25.0000 mg | ORAL_CAPSULE | Freq: Four times a day (QID) | ORAL | Status: DC | PRN

## 2015-01-17 MED ORDER — HYDROMORPHONE HCL 2 MG PO TABS
2.0000 mg | ORAL_TABLET | ORAL | Status: DC
  Administered 2015-01-17: 2 mg via ORAL
  Administered 2015-01-17: 4 mg via ORAL
  Administered 2015-01-17: 2 mg via ORAL
  Administered 2015-01-18 – 2015-01-19 (×9): 4 mg via ORAL
  Filled 2015-01-17 (×5): qty 2
  Filled 2015-01-17: qty 1
  Filled 2015-01-17 (×5): qty 2
  Filled 2015-01-17: qty 1

## 2015-01-17 MED ORDER — PROPOFOL 10 MG/ML IV BOLUS
INTRAVENOUS | Status: AC
  Filled 2015-01-17: qty 20

## 2015-01-17 MED ORDER — KETOROLAC TROMETHAMINE 30 MG/ML IJ SOLN
INTRAMUSCULAR | Status: AC
  Filled 2015-01-17: qty 1

## 2015-01-17 MED ORDER — MIDAZOLAM HCL 5 MG/5ML IJ SOLN
INTRAMUSCULAR | Status: DC | PRN
  Administered 2015-01-17 (×2): 1 mg via INTRAVENOUS

## 2015-01-17 MED ORDER — KETOROLAC TROMETHAMINE 30 MG/ML IJ SOLN
INTRAMUSCULAR | Status: DC | PRN
  Administered 2015-01-17: 30 mg

## 2015-01-17 MED ORDER — CEFAZOLIN SODIUM-DEXTROSE 2-3 GM-% IV SOLR
2.0000 g | Freq: Four times a day (QID) | INTRAVENOUS | Status: AC
  Administered 2015-01-17 (×2): 2 g via INTRAVENOUS
  Filled 2015-01-17 (×2): qty 50

## 2015-01-17 MED ORDER — PRAVASTATIN SODIUM 20 MG PO TABS
20.0000 mg | ORAL_TABLET | Freq: Every day | ORAL | Status: DC
  Administered 2015-01-17 – 2015-01-19 (×3): 20 mg via ORAL
  Filled 2015-01-17 (×3): qty 1

## 2015-01-17 MED ORDER — BUPIVACAINE-EPINEPHRINE (PF) 0.25% -1:200000 IJ SOLN
INTRAMUSCULAR | Status: AC
Start: 2015-01-17 — End: 2015-01-17
  Filled 2015-01-17: qty 30

## 2015-01-17 MED ORDER — BUPIVACAINE-EPINEPHRINE (PF) 0.25% -1:200000 IJ SOLN
INTRAMUSCULAR | Status: DC | PRN
  Administered 2015-01-17: 30 mL

## 2015-01-17 MED ORDER — TRANEXAMIC ACID 1000 MG/10ML IV SOLN
1000.0000 mg | Freq: Once | INTRAVENOUS | Status: AC
  Administered 2015-01-17: 1000 mg via INTRAVENOUS
  Filled 2015-01-17: qty 10

## 2015-01-17 MED ORDER — MENTHOL 3 MG MT LOZG: 1.0000 | LOZENGE | OROMUCOSAL | Status: DC | PRN

## 2015-01-17 MED ORDER — MEPERIDINE HCL 50 MG/ML IJ SOLN: 6.2500 mg | INTRAMUSCULAR | Status: DC | PRN

## 2015-01-17 MED ORDER — ASPIRIN EC 325 MG PO TBEC
325.0000 mg | DELAYED_RELEASE_TABLET | Freq: Two times a day (BID) | ORAL | Status: DC
  Administered 2015-01-18 – 2015-01-19 (×3): 325 mg via ORAL
  Filled 2015-01-17 (×5): qty 1

## 2015-01-17 MED ORDER — ONDANSETRON HCL 4 MG/2ML IJ SOLN
INTRAMUSCULAR | Status: AC
  Filled 2015-01-17: qty 2

## 2015-01-17 MED ORDER — DEXAMETHASONE SODIUM PHOSPHATE 10 MG/ML IJ SOLN
INTRAMUSCULAR | Status: AC
Start: 2015-01-17 — End: 2015-01-17
  Filled 2015-01-17: qty 1

## 2015-01-17 MED ORDER — CEFAZOLIN SODIUM-DEXTROSE 2-3 GM-% IV SOLR
INTRAVENOUS | Status: AC
Start: 2015-01-17 — End: 2015-01-17
  Filled 2015-01-17: qty 50

## 2015-01-17 MED ORDER — METOCLOPRAMIDE HCL 5 MG/ML IJ SOLN: 5.0000 mg | Freq: Three times a day (TID) | INTRAMUSCULAR | Status: DC | PRN

## 2015-01-17 MED ORDER — CEFAZOLIN SODIUM-DEXTROSE 2-3 GM-% IV SOLR
2.0000 g | INTRAVENOUS | Status: AC
  Administered 2015-01-17: 2 g via INTRAVENOUS

## 2015-01-17 MED ORDER — DEXAMETHASONE SODIUM PHOSPHATE 10 MG/ML IJ SOLN
10.0000 mg | Freq: Once | INTRAMUSCULAR | Status: AC
  Administered 2015-01-17: 10 mg via INTRAVENOUS

## 2015-01-17 MED ORDER — CELECOXIB 200 MG PO CAPS
200.0000 mg | ORAL_CAPSULE | Freq: Two times a day (BID) | ORAL | Status: DC
  Administered 2015-01-17 – 2015-01-19 (×4): 200 mg via ORAL
  Filled 2015-01-17 (×5): qty 1

## 2015-01-17 MED ORDER — SODIUM CHLORIDE 0.9 % IJ SOLN
INTRAMUSCULAR | Status: AC
  Filled 2015-01-17: qty 50

## 2015-01-17 MED ORDER — METHOCARBAMOL 500 MG PO TABS
500.0000 mg | ORAL_TABLET | Freq: Four times a day (QID) | ORAL | Status: DC | PRN
  Administered 2015-01-17 – 2015-01-18 (×4): 500 mg via ORAL
  Filled 2015-01-17 (×5): qty 1

## 2015-01-17 MED ORDER — POLYETHYLENE GLYCOL 3350 17 G PO PACK
17.0000 g | PACK | Freq: Two times a day (BID) | ORAL | Status: DC
  Administered 2015-01-17 – 2015-01-19 (×4): 17 g via ORAL

## 2015-01-17 MED ORDER — DOCUSATE SODIUM 100 MG PO CAPS
100.0000 mg | ORAL_CAPSULE | Freq: Two times a day (BID) | ORAL | Status: DC
  Administered 2015-01-17 – 2015-01-19 (×4): 100 mg via ORAL

## 2015-01-17 MED ORDER — PROMETHAZINE HCL 25 MG/ML IJ SOLN: 6.2500 mg | INTRAMUSCULAR | Status: DC | PRN

## 2015-01-17 MED ORDER — FENTANYL CITRATE (PF) 100 MCG/2ML IJ SOLN: 25.0000 ug | INTRAMUSCULAR | Status: DC | PRN

## 2015-01-17 MED ORDER — HYDROCHLOROTHIAZIDE 25 MG PO TABS
25.0000 mg | ORAL_TABLET | Freq: Every day | ORAL | Status: DC
  Administered 2015-01-17 – 2015-01-19 (×3): 25 mg via ORAL
  Filled 2015-01-17 (×3): qty 1

## 2015-01-17 MED ORDER — MIRTAZAPINE 15 MG PO TABS
15.0000 mg | ORAL_TABLET | Freq: Every day | ORAL | Status: DC
  Administered 2015-01-17 – 2015-01-18 (×2): 15 mg via ORAL
  Filled 2015-01-17 (×3): qty 1

## 2015-01-17 MED ORDER — ONDANSETRON HCL 4 MG PO TABS: 4.0000 mg | ORAL_TABLET | Freq: Four times a day (QID) | ORAL | Status: DC | PRN

## 2015-01-17 MED ORDER — SODIUM CHLORIDE 0.9 % IV SOLN
INTRAVENOUS | Status: DC
  Administered 2015-01-17 – 2015-01-18 (×2): via INTRAVENOUS
  Filled 2015-01-17 (×6): qty 1000

## 2015-01-17 MED ORDER — ONDANSETRON HCL 4 MG/2ML IJ SOLN: 4.0000 mg | Freq: Four times a day (QID) | INTRAMUSCULAR | Status: DC | PRN

## 2015-01-17 MED ORDER — SODIUM CHLORIDE 0.9 % IR SOLN
Status: DC | PRN
  Administered 2015-01-17: 1000 mL

## 2015-01-17 MED ORDER — ALUM & MAG HYDROXIDE-SIMETH 200-200-20 MG/5ML PO SUSP: 30.0000 mL | ORAL | Status: DC | PRN

## 2015-01-17 MED ORDER — DEXAMETHASONE SODIUM PHOSPHATE 10 MG/ML IJ SOLN
10.0000 mg | Freq: Once | INTRAMUSCULAR | Status: AC
  Administered 2015-01-18: 10 mg via INTRAVENOUS
  Filled 2015-01-17: qty 1

## 2015-01-17 MED ORDER — ONDANSETRON HCL 4 MG/2ML IJ SOLN
INTRAMUSCULAR | Status: DC | PRN
  Administered 2015-01-17: 4 mg via INTRAVENOUS

## 2015-01-17 MED ORDER — LIDOCAINE HCL (CARDIAC) 20 MG/ML IV SOLN
INTRAVENOUS | Status: DC | PRN
  Administered 2015-01-17: 100 mg via INTRAVENOUS

## 2015-01-17 MED ORDER — HYDROMORPHONE HCL 1 MG/ML IJ SOLN
0.5000 mg | INTRAMUSCULAR | Status: DC | PRN
  Administered 2015-01-17 (×2): 0.5 mg via INTRAVENOUS
  Filled 2015-01-17 (×2): qty 1

## 2015-01-17 MED ORDER — MIDAZOLAM HCL 2 MG/2ML IJ SOLN
INTRAMUSCULAR | Status: AC
  Filled 2015-01-17: qty 4

## 2015-01-17 MED ORDER — LACTATED RINGERS IV SOLN
INTRAVENOUS | Status: DC
  Administered 2015-01-17: 11:00:00 via INTRAVENOUS
  Administered 2015-01-17: 1000 mL via INTRAVENOUS

## 2015-01-17 MED ORDER — LIDOCAINE HCL (CARDIAC) 20 MG/ML IV SOLN
INTRAVENOUS | Status: AC
  Filled 2015-01-17: qty 5

## 2015-01-17 MED ORDER — SODIUM CHLORIDE 0.9 % IJ SOLN
INTRAMUSCULAR | Status: DC | PRN
  Administered 2015-01-17: 30 mL

## 2015-01-17 MED ORDER — PHENOL 1.4 % MT LIQD
1.0000 | OROMUCOSAL | Status: DC | PRN
  Filled 2015-01-17: qty 177

## 2015-01-17 MED ORDER — BISACODYL 10 MG RE SUPP
10.0000 mg | Freq: Every day | RECTAL | Status: DC | PRN
Start: 2015-01-17 — End: 2015-01-19

## 2015-01-17 MED ORDER — FENTANYL CITRATE (PF) 100 MCG/2ML IJ SOLN
INTRAMUSCULAR | Status: AC
  Filled 2015-01-17: qty 2

## 2015-01-17 MED ORDER — FENTANYL CITRATE (PF) 100 MCG/2ML IJ SOLN
INTRAMUSCULAR | Status: DC | PRN
  Administered 2015-01-17: 25 ug via INTRAVENOUS

## 2015-01-17 MED ORDER — PROPOFOL 500 MG/50ML IV EMUL
INTRAVENOUS | Status: DC | PRN
  Administered 2015-01-17: 25 ug/kg/min via INTRAVENOUS

## 2015-01-17 SURGICAL SUPPLY — 53 items
BAG DECANTER FOR FLEXI CONT (MISCELLANEOUS) IMPLANT
BAG ZIPLOCK 12X15 (MISCELLANEOUS) IMPLANT
BANDAGE ELASTIC 6 VELCRO ST LF (GAUZE/BANDAGES/DRESSINGS) ×2 IMPLANT
BANDAGE ESMARK 6X9 LF (GAUZE/BANDAGES/DRESSINGS) ×1 IMPLANT
BLADE SAW SGTL 13.0X1.19X90.0M (BLADE) ×2 IMPLANT
BNDG ESMARK 6X9 LF (GAUZE/BANDAGES/DRESSINGS) ×2
BOWL SMART MIX CTS (DISPOSABLE) ×2 IMPLANT
CAPT KNEE TOTAL 3 ATTUNE ×2 IMPLANT
CEMENT HV SMART SET (Cement) ×4 IMPLANT
CUFF TOURN SGL QUICK 34 (TOURNIQUET CUFF) ×1
CUFF TRNQT CYL 34X4X40X1 (TOURNIQUET CUFF) ×1 IMPLANT
DECANTER SPIKE VIAL GLASS SM (MISCELLANEOUS) ×2 IMPLANT
DRAPE EXTREMITY T 121X128X90 (DRAPE) ×2 IMPLANT
DRAPE POUCH INSTRU U-SHP 10X18 (DRAPES) ×2 IMPLANT
DRAPE U-SHAPE 47X51 STRL (DRAPES) ×2 IMPLANT
DRSG AQUACEL AG ADV 3.5X10 (GAUZE/BANDAGES/DRESSINGS) ×2 IMPLANT
DURAPREP 26ML APPLICATOR (WOUND CARE) ×4 IMPLANT
ELECT REM PT RETURN 9FT ADLT (ELECTROSURGICAL) ×2
ELECTRODE REM PT RTRN 9FT ADLT (ELECTROSURGICAL) ×1 IMPLANT
FACESHIELD WRAPAROUND (MASK) ×10 IMPLANT
GLOVE BIOGEL PI IND STRL 7.5 (GLOVE) ×5 IMPLANT
GLOVE BIOGEL PI IND STRL 8.5 (GLOVE) ×1 IMPLANT
GLOVE BIOGEL PI INDICATOR 7.5 (GLOVE) ×5
GLOVE BIOGEL PI INDICATOR 8.5 (GLOVE) ×1
GLOVE ECLIPSE 8.0 STRL XLNG CF (GLOVE) ×2 IMPLANT
GLOVE ORTHO TXT STRL SZ7.5 (GLOVE) ×4 IMPLANT
GOWN SPEC L3 XXLG W/TWL (GOWN DISPOSABLE) ×2 IMPLANT
GOWN STRL REUS W/TWL LRG LVL3 (GOWN DISPOSABLE) ×6 IMPLANT
HANDPIECE INTERPULSE COAX TIP (DISPOSABLE) ×1
KIT BASIN OR (CUSTOM PROCEDURE TRAY) ×2 IMPLANT
LIQUID BAND (GAUZE/BANDAGES/DRESSINGS) ×2 IMPLANT
MANIFOLD NEPTUNE II (INSTRUMENTS) ×2 IMPLANT
NDL SAFETY ECLIPSE 18X1.5 (NEEDLE) ×1 IMPLANT
NEEDLE HYPO 18GX1.5 SHARP (NEEDLE) ×1
PACK TOTAL JOINT (CUSTOM PROCEDURE TRAY) ×2 IMPLANT
PEN SKIN MARKING BROAD (MISCELLANEOUS) ×2 IMPLANT
POSITIONER SURGICAL ARM (MISCELLANEOUS) ×2 IMPLANT
SET HNDPC FAN SPRY TIP SCT (DISPOSABLE) ×1 IMPLANT
SET PAD KNEE POSITIONER (MISCELLANEOUS) ×2 IMPLANT
SUCTION FRAZIER 12FR DISP (SUCTIONS) ×2 IMPLANT
SUT MNCRL AB 4-0 PS2 18 (SUTURE) ×2 IMPLANT
SUT VIC AB 1 CT1 36 (SUTURE) ×2 IMPLANT
SUT VIC AB 2-0 CT1 27 (SUTURE) ×3
SUT VIC AB 2-0 CT1 TAPERPNT 27 (SUTURE) ×3 IMPLANT
SUT VLOC 180 0 24IN GS25 (SUTURE) ×2 IMPLANT
SYR 50ML LL SCALE MARK (SYRINGE) ×2 IMPLANT
TOWEL OR 17X26 10 PK STRL BLUE (TOWEL DISPOSABLE) ×2 IMPLANT
TOWEL OR NON WOVEN STRL DISP B (DISPOSABLE) ×2 IMPLANT
TRAY FOLEY W/METER SILVER 14FR (SET/KITS/TRAYS/PACK) ×2 IMPLANT
TRAY FOLEY W/METER SILVER 16FR (SET/KITS/TRAYS/PACK) IMPLANT
WATER STERILE IRR 1500ML POUR (IV SOLUTION) ×2 IMPLANT
WRAP KNEE MAXI GEL POST OP (GAUZE/BANDAGES/DRESSINGS) ×2 IMPLANT
YANKAUER SUCT BULB TIP 10FT TU (MISCELLANEOUS) ×2 IMPLANT

## 2015-01-17 NOTE — Transfer of Care (Signed)
Immediate Anesthesia Transfer of Care Note  Patient: Sheena Taylor  Procedure(s) Performed: Procedure(s): TOTAL RIGHT KNEE ARTHROPLASTY (Right)  Patient Location: PACU  Anesthesia Type:Spinal  Level of Consciousness: awake, alert , oriented and patient cooperative  Airway & Oxygen Therapy: Patient Spontanous Breathing and Patient connected to face mask oxygen  Post-op Assessment: Report given to RN, Post -op Vital signs reviewed and stable and SAB level T11.    Post vital signs: Reviewed and stable  Last Vitals:  Filed Vitals:   01/17/15 0759  BP: 157/65  Pulse: 64  Temp: 36.5 C  Resp: 16    Complications: No apparent anesthesia complications

## 2015-01-17 NOTE — Op Note (Signed)
NAME:  Sheena Taylor                      MEDICAL RECORD NO.:  [id number]                             FACILITY:  [facility]      PHYSICIAN:  Pietro Cassis. Alvan Dame, M.D.  DATE OF BIRTH:  1935-03-13      DATE OF PROCEDURE:  01/17/2015                                     OPERATIVE REPORT         PREOPERATIVE DIAGNOSIS:  Right knee osteoarthritis.      POSTOPERATIVE DIAGNOSIS:  Right knee osteoarthritis.      FINDINGS:  The patient was noted to have complete loss of cartilage and   bone-on-bone arthritis with associated osteophytes in all three compartments of   the knee with a significant synovitis and associated effusion.      PROCEDURE:  Right total knee replacement.      COMPONENTS USED:  DePuy Attune rotating platform posterior stabilized knee   system, a size 6 femur, 7 tibia, size 8 mm PS AOX insert, and 38 anatomic patellar   button.      SURGEON:  Pietro Cassis. Alvan Dame, M.D.      ASSISTANT:  Danae Orleans, PA-C.      ANESTHESIA:  Spinal.      SPECIMENS:  None.      COMPLICATION:  None.      DRAINS:  None.  EBL: <50cc      TOURNIQUET TIME:   Total Tourniquet Time Documented: Thigh (Right) - 28 minutes Total: Thigh (Right) - 28 minutes  .      The patient was stable to the recovery room.      INDICATION FOR PROCEDURE:  Sheena Taylor is a 80 y.o. female patient of   mine.  The patient had been seen, evaluated, and treated conservatively in the   office with medication, activity modification, and injections.  The patient had   radiographic changes of bone-on-bone arthritis with endplate sclerosis and osteophytes noted.      The patient failed conservative measures including medication, injections, and activity modification, and at this point was ready for more definitive measures.   Based on the radiographic changes and failed conservative measures, the patient   decided to proceed with total knee replacement.  Risks of infection,   DVT, component failure, need for  revision surgery, postop course, and   expectations were all   discussed and reviewed.  Consent was obtained for benefit of pain   relief.      PROCEDURE IN DETAIL:  The patient was brought to the operative theater.   Once adequate anesthesia, preoperative antibiotics, 2 gm of Ancef, 1 gm of Tranexamic Acid, and 10 mg of Decadron administered, the patient was positioned supine with the right thigh tourniquet placed.  The  right lower extremity was prepped and draped in sterile fashion.  A time-   out was performed identifying the patient, planned procedure, and   extremity.      The right lower extremity was placed in the [facility] leg holder.  The leg was   exsanguinated, tourniquet elevated to 250 mmHg.  A midline incision was   made followed by  median parapatellar arthrotomy.  Following initial   exposure, attention was first directed to the patella.  Precut   measurement was noted to be 22 mm.  I resected down to 14 mm and used a   38 patellar button to restore patellar height as well as cover the cut   surface.      The lug holes were drilled and a metal shim was placed to protect the   patella from retractors and saw blades.      At this point, attention was now directed to the femur.  The femoral   canal was opened with a drill, irrigated to try to prevent fat emboli.  An   intramedullary rod was passed at 3 degrees valgus, 9 mm of bone was   resected off the distal femur.  Following this resection, the tibia was   subluxated anteriorly.  Using the extramedullary guide, 2 mm of bone was resected off   the proximal medial tibia.  We confirmed the gap would be   stable medially and laterally with a size 48mm insert as well as confirmed   the cut was perpendicular in the coronal plane, checking with an alignment rod.      Once this was done, I sized the femur to be a size 6 in the anterior-   posterior dimension, chose a standard component based on medial and   lateral dimension.   The size 6 rotation block was then pinned in   position anterior referenced using the C-clamp to set rotation.  The   anterior, posterior, and  chamfer cuts were made without difficulty nor   notching making certain that I was along the anterior cortex to help   with flexion gap stability.      The final box cut was made off the lateral aspect of distal femur.      At this point, the tibia was sized to be a size 7, the size 7 tray was   then pinned in position through the medial third of the tubercle,   drilled, and keel punched.  Trial reduction was now carried with a 6 femur,  7 tibia, a size 7 then size 8 mm insert, and the 38 patella botton.  The knee was brought to   extension, full extension with good flexion stability with the patella   tracking through the trochlea without application of pressure.  Given   all these findings, the trial components removed.  Final components were   opened and cement was mixed.  The knee was irrigated with normal saline   solution and pulse lavage.  The synovial lining was   then injected with 30cc of 0.25% Marcaine with epinephrine and 1 cc of Toradol plus 30cc of NS  total of 61 cc.      The knee was irrigated.  Final implants were then cemented onto clean and   dried cut surfaces of bone with the knee brought to extension with a size 8   mm trial insert.      Once the cement had fully cured, the excess cement was removed   throughout the knee.  I confirmed I was satisfied with the range of   motion and stability, and the final size 8 mm PS AOX insert was chosen.  It was   placed into the knee.      The tourniquet had been let down at 28 minutes.  No significant   hemostasis required.  The   extensor  mechanism was then reapproximated using #1 Vicryl and #0 V-lock sutures with the knee   in flexion.  The   remaining wound was closed with 2-0 Vicryl and running 4-0 Monocryl.   The knee was cleaned, dried, dressed sterilely using Dermabond and    Aquacel dressing.  The patient was then   brought to recovery room in stable condition, tolerating the procedure   well.   Please note that Physician Assistant, Danae Orleans, PA-C, was present for the entirety of the case, and was utilized for pre-operative positioning, peri-operative retractor management, general facilitation of the procedure.  He was also utilized for primary wound closure at the end of the case.              Pietro Cassis Alvan Dame, M.D.    01/17/2015 11:47 AM

## 2015-01-17 NOTE — Anesthesia Postprocedure Evaluation (Signed)
  Anesthesia Post-op Note  Patient: Sheena Taylor  Procedure(s) Performed: Procedure(s) (LRB): TOTAL RIGHT KNEE ARTHROPLASTY (Right)  Patient Location: PACU  Anesthesia Type: Spinal  Level of Consciousness: awake and alert   Airway and Oxygen Therapy: Patient Spontanous Breathing  Post-op Pain: mild  Post-op Assessment: Post-op Vital signs reviewed, Patient's Cardiovascular Status Stable, Respiratory Function Stable, Patent Airway and No signs of Nausea or vomiting  Last Vitals:  Filed Vitals:   01/17/15 1321  BP: 148/52  Pulse: 53  Temp: 36.7 C  Resp: 16    Post-op Vital Signs: stable   Complications: No apparent anesthesia complications

## 2015-01-17 NOTE — Plan of Care (Signed)
Problem: Consults Goal: Total Joint Replacement Patient Education See Patient Education Module for education specifics. Right total knee Goal: Diagnosis- Total Joint Replacement Right total knee

## 2015-01-17 NOTE — Anesthesia Preprocedure Evaluation (Addendum)
Anesthesia Evaluation  Patient identified by MRN, date of birth, ID band Patient awake    Reviewed: Allergy & Precautions, NPO status , Patient's Chart, lab work & pertinent test results  Airway Mallampati: II  TM Distance: >3 FB Neck ROM: Full    Dental no notable dental hx.    Pulmonary neg pulmonary ROS, Current Smoker,    Pulmonary exam normal breath sounds clear to auscultation       Cardiovascular hypertension, Pt. on medications negative cardio ROS Normal cardiovascular exam Rhythm:Regular Rate:Normal     Neuro/Psych negative neurological ROS  negative psych ROS   GI/Hepatic negative GI ROS, Neg liver ROS,   Endo/Other  negative endocrine ROS  Renal/GU negative Renal ROS  negative genitourinary   Musculoskeletal negative musculoskeletal ROS (+)   Abdominal   Peds negative pediatric ROS (+)  Hematology negative hematology ROS (+)   Anesthesia Other Findings   Reproductive/Obstetrics negative OB ROS                           Anesthesia Physical Anesthesia Plan  ASA: II  Anesthesia Plan: Spinal   Post-op Pain Management:    Induction:   Airway Management Planned: Simple Face Mask  Additional Equipment:   Intra-op Plan:   Post-operative Plan:   Informed Consent: I have reviewed the patients History and Physical, chart, labs and discussed the procedure including the risks, benefits and alternatives for the proposed anesthesia with the patient or authorized representative who has indicated his/her understanding and acceptance.   Dental advisory given  Plan Discussed with: CRNA  Anesthesia Plan Comments:         Anesthesia Quick Evaluation

## 2015-01-17 NOTE — Anesthesia Procedure Notes (Signed)
Procedure Name: MAC Date/Time: 01/17/2015 10:15 AM Performed by: Carleene Cooper A Pre-anesthesia Checklist: Patient identified, Emergency Drugs available, Patient being monitored, Suction available and Timeout performed Patient Re-evaluated:Patient Re-evaluated prior to inductionOxygen Delivery Method: Simple face mask Dental Injury: Teeth and Oropharynx as per pre-operative assessment

## 2015-01-17 NOTE — Interval H&P Note (Signed)
History and Physical Interval Note:  01/17/2015 9:35 AM  Sheena Taylor  has presented today for surgery, with the diagnosis of RIGHT KNEE OA   The various methods of treatment have been discussed with the patient and family. After consideration of risks, benefits and other options for treatment, the patient has consented to  Procedure(s): TOTAL RIGHT KNEE ARTHROPLASTY (Right) as a surgical intervention .  The patient's history has been reviewed, patient examined, no change in status, stable for surgery.  I have reviewed the patient's chart and labs.  Questions were answered to the patient's satisfaction.     Mauri Pole

## 2015-01-18 LAB — CBC
HCT: 33.7 % — ABNORMAL LOW (ref 36.0–46.0)
Hemoglobin: 11 g/dL — ABNORMAL LOW (ref 12.0–15.0)
MCH: 30.6 pg (ref 26.0–34.0)
MCHC: 32.6 g/dL (ref 30.0–36.0)
MCV: 93.6 fL (ref 78.0–100.0)
PLATELETS: 220 10*3/uL (ref 150–400)
RBC: 3.6 MIL/uL — AB (ref 3.87–5.11)
RDW: 13.3 % (ref 11.5–15.5)
WBC: 14.8 10*3/uL — ABNORMAL HIGH (ref 4.0–10.5)

## 2015-01-18 LAB — BASIC METABOLIC PANEL
Anion gap: 7 (ref 5–15)
BUN: 12 mg/dL (ref 6–20)
CALCIUM: 8.5 mg/dL — AB (ref 8.9–10.3)
CO2: 29 mmol/L (ref 22–32)
CREATININE: 0.57 mg/dL (ref 0.44–1.00)
Chloride: 105 mmol/L (ref 101–111)
GFR calc Af Amer: >60 mL/min (ref >60)
GLUCOSE: 125 mg/dL — AB (ref 65–99)
Potassium: 3.2 mmol/L — ABNORMAL LOW (ref 3.5–5.1)
SODIUM: 141 mmol/L (ref 135–145)

## 2015-01-18 MED ORDER — DOCUSATE SODIUM 100 MG PO CAPS: 100.0000 mg | ORAL_CAPSULE | Freq: Two times a day (BID) | ORAL | Status: DC

## 2015-01-18 MED ORDER — HYDROMORPHONE HCL 2 MG PO TABS: 2.0000 mg | ORAL_TABLET | ORAL | Status: DC | PRN

## 2015-01-18 MED ORDER — ASPIRIN 325 MG PO TBEC: 325.0000 mg | DELAYED_RELEASE_TABLET | Freq: Two times a day (BID) | ORAL | Status: AC

## 2015-01-18 MED ORDER — POLYETHYLENE GLYCOL 3350 17 G PO PACK: 17.0000 g | PACK | Freq: Two times a day (BID) | ORAL | Status: DC

## 2015-01-18 MED ORDER — TIZANIDINE HCL 4 MG PO TABS: 4.0000 mg | ORAL_TABLET | Freq: Four times a day (QID) | ORAL | Status: DC | PRN

## 2015-01-18 MED ORDER — FERROUS SULFATE 325 (65 FE) MG PO TABS: 325.0000 mg | ORAL_TABLET | Freq: Three times a day (TID) | ORAL | Status: DC

## 2015-01-18 NOTE — Discharge Instructions (Signed)

## 2015-01-18 NOTE — Evaluation (Signed)
Physical Therapy Evaluation Patient Details Name: Sheena Taylor MRN: [id number] DOB: 1934-08-16 Today's Date: 01/18/2015   History of Present Illness  s/p R TKA  Clinical Impression  Pt will benefit from PT to address deficits below; Pt moving well but with near LOC after amb 30', chair brought to pt, VSS; RN aware; may need another night/second day of PT to be safe to D/C home    Follow Up Recommendations Home health PT    Equipment Recommendations  Rolling walker with 5" wheels    Recommendations for Other Services       Precautions / Restrictions Precautions Precautions: Knee Restrictions Other Position/Activity Restrictions: WBAT      Mobility  Bed Mobility Overal bed mobility: Needs Assistance Bed Mobility: Supine to Sit     Supine to sit: Min assist     General bed mobility comments: assist with RLE  Transfers Overall transfer level: Needs assistance Equipment used: Rolling walker (2 wheeled) Transfers: Sit to/from Stand Sit to Stand: Min assist         General transfer comment: cues for hand placement  Ambulation/Gait Ambulation/Gait assistance: Min assist Ambulation Distance (Feet): 30 Feet Assistive device: Rolling walker (2 wheeled) Gait Pattern/deviations: Step-to pattern;Antalgic Gait velocity: pt with c/o dizziness, mildly diaphoretic--after back in chair BP 150/62, sats 96% on RA, HR 72-- RN notified   General Gait Details: cues for sequence, step length, safety;   Stairs            Wheelchair Mobility    Modified Rankin (Stroke Patients Only)       Balance                                             Pertinent Vitals/Pain Pain Assessment: 0-10 Pain Score: 5  Pain Location: right knee Pain Descriptors / Indicators: Sore Pain Intervention(s): Monitored during session;Limited activity within patient's tolerance;Repositioned    Home Living Family/patient expects to be discharged to:: Private  residence Living Arrangements: Spouse/significant other   Type of Home: House Home Access: Stairs to enter   Technical brewer of Steps: 1 Home Layout: Laundry or work area in [place]: Kasandra Knudsen - single point      Prior Function Level of Independence: Independent               Journalist, newspaper        Extremity/Trunk Assessment   Upper Extremity Assessment: Defer to OT evaluation           Lower Extremity Assessment: RLE deficits/detail RLE Deficits / Details: ankle WFL, knee extension and hip flexion 2/5       Communication   Communication: No difficulties  Cognition Arousal/Alertness: Awake/alert Behavior During Therapy: WFL for tasks assessed/performed Overall Cognitive Status: Within Functional Limits for tasks assessed                      General Comments      Exercises Total Joint Exercises Ankle Circles/Pumps: AROM;Both;10 reps Quad Sets: 10 reps;Both;AROM Heel Slides: AROM;AAROM;Right;10 reps      Assessment/Plan    PT Assessment Patient needs continued PT services  PT Diagnosis Difficulty walking   PT Problem List Decreased strength;Decreased range of motion;Decreased activity tolerance;Decreased mobility;Decreased knowledge of use of DME  PT Treatment Interventions DME instruction;Gait training;Functional mobility training;Therapeutic activities;Therapeutic exercise;Patient/family education   PT Goals (Current goals can  be found in the Care Plan section) Acute Rehab PT Goals Patient Stated Goal: return to independence PT Goal Formulation: With patient Time For Goal Achievement: 01/21/15 Potential to Achieve Goals: Good    Frequency 7X/week   Barriers to discharge        Co-evaluation               End of Session Equipment Utilized During Treatment: Gait belt Activity Tolerance: Treatment limited secondary to medical complications (Comment) Patient left: in chair;with call bell/phone within  reach;with family/visitor present           Time: 0940-1010 PT Time Calculation (min) (ACUTE ONLY): 30 min   Charges:   PT Evaluation $Initial PT Evaluation Tier I: 1 Procedure PT Treatments $Gait Training: 8-22 mins   PT G Codes:        Ferdinando Lodge 01/22/15, 10:27 AM

## 2015-01-18 NOTE — Progress Notes (Signed)
Patient ID: DANAYE SOBH, female   DOB: 04/04/1935, 80 y.o.   MRN: [id number] Subjective: 1 Day Post-Op Procedure(s) (LRB): TOTAL RIGHT KNEE ARTHROPLASTY (Right)    Patient reports pain as moderate or so.  Doing all right this am waiting for breakfast.  Woody in room with her this am  Objective:   VITALS:   Filed Vitals:   01/18/15 1000  BP: 124/48  Pulse: 59  Temp: 97.7 F (36.5 C)  Resp: 18    Neurovascular intact Incision: dressing C/D/I  LABS  Recent Labs  01/18/15 0510  HGB 11.0*  HCT 33.7*  WBC 14.8*  PLT 220     Recent Labs  01/18/15 0510  NA 141  K 3.2*  BUN 12  CREATININE 0.57  GLUCOSE 125*    No results for input(s): LABPT, INR in the last 72 hours.   Assessment/Plan: 1 Day Post-Op Procedure(s) (LRB): TOTAL RIGHT KNEE ARTHROPLASTY (Right)   Advance diet Up with therapy Discharge home with home health today and tomorrow  RTC in 2 weeks

## 2015-01-18 NOTE — Progress Notes (Signed)
   01/18/15 1400  PT Visit Information  Last PT Received On 01/18/15  Assistance Needed +1  History of Present Illness s/p R TKA  PT Time Calculation  PT Start Time (ACUTE ONLY) 1431  PT Stop Time (ACUTE ONLY) 1457  PT Time Calculation (min) (ACUTE ONLY) 26 min  Subjective Data  Patient Stated Goal return to independence  Precautions  Precautions Knee  Restrictions  Other Position/Activity Restrictions WBAT  Pain Assessment  Pain Assessment 0-10  Pain Score 5  Pain Location right knee  Pain Descriptors / Indicators Sore  Pain Intervention(s) Limited activity within patient's tolerance;Monitored during session;Premedicated before session;Ice applied  Cognition  Arousal/Alertness Awake/alert  Behavior During Therapy WFL for tasks assessed/performed  Overall Cognitive Status Within Functional Limits for tasks assessed  Transfers  Overall transfer level Needs assistance  Equipment used Rolling walker (2 wheeled)  Transfers Sit to/from Stand  Sit to Stand Min guard (x3)  General transfer comment cues for hand placement  Ambulation/Gait  Ambulation/Gait assistance Min guard  Ambulation Distance (Feet) 80 Feet (10)  Assistive device Rolling walker (2 wheeled)  Gait Pattern/deviations Step-to pattern;Antalgic  General Gait Details cues for sequence, step length, safety;   PT - End of Session  Equipment Utilized During Treatment Gait belt  Activity Tolerance Patient tolerated treatment well  Patient left in chair;with call bell/phone within reach;with nursing/sitter in room  Nurse Communication Mobility status  PT - Assessment/Plan  PT Plan Current plan remains appropriate  PT Frequency (ACUTE ONLY) 7X/week  Follow Up Recommendations Home health PT  PT equipment Rolling walker with 5" wheels  PT Goal Progression  Progress towards PT goals Progressing toward goals  Acute Rehab PT Goals  PT Goal Formulation With patient  Time For Goal Achievement 01/21/15  Potential to  Achieve Goals Good  PT General Charges  $$ ACUTE PT VISIT 1 Procedure  PT Treatments  $Gait Training 23-37 mins

## 2015-01-18 NOTE — Progress Notes (Signed)
OT Cancellation Note  Patient Details Name: NILI HONDA MRN: [id number] DOB: 11-08-1934   Cancelled Treatment:    Reason Eval/Treat Not Completed: Other (comment) (PT working with patient). Will re-attempt OT eval as able/time allows, possibly tomorrow.   Latoiya Maradiaga , MS, OTR/L, CLT Pager: [phone]  01/18/2015, 2:28 PM

## 2015-01-18 NOTE — Care Management Note (Addendum)
Case Management Note  Patient Details  Name: Sheena Taylor MRN: [id number] Date of Birth: September 16, 1934  Subjective/Objective:                  TOTAL RIGHT KNEE ARTHROPLASTY (Right)  Action/Plan: Discharge planning  Expected Discharge Date:  01/18/15               Expected Discharge Plan:  [place]  In-House Referral:     Discharge planning Services  CM Consult  Post Acute Care Choice:  Home Health Choice offered to:  Patient  DME Arranged:  3-N-1, Walker rolling DME Agency:  [place]:  PT [facility] Agency:     Status of Service:  Completed, signed off  Medicare Important Message Given:    Date Medicare IM Given:    Medicare IM give by:    Date Additional Medicare IM Given:    Additional Medicare Important Message give by:     If discussed at [place] of Stay Meetings, dates discussed:    Additional Comments: Utilization Review complete. CM met with pt in room to offer choice of home health agency.  Pt chooses [facility] to render HHPT.  [facility] and contact information verified by pt.  Referral called to [facility] rep.  CM called [facility] DME rep, Lecretia to please deliver the rolling walker and 3n1 to room prior to discharge.  No other CM needs were communicated.  Dellie Catholic, RN 01/18/2015, 10:24 AM

## 2015-01-19 DIAGNOSIS — Z1212 Encounter for screening for malignant neoplasm of rectum: Secondary | ICD-10-CM | POA: Diagnosis not present

## 2015-01-19 DIAGNOSIS — E669 Obesity, unspecified: Secondary | ICD-10-CM | POA: Diagnosis present

## 2015-01-19 LAB — BASIC METABOLIC PANEL
Anion gap: 6 (ref 5–15)
BUN: 12 mg/dL (ref 6–20)
CO2: 32 mmol/L (ref 22–32)
Calcium: 8.6 mg/dL — ABNORMAL LOW (ref 8.9–10.3)
Chloride: 104 mmol/L (ref 101–111)
Creatinine, Ser: 0.52 mg/dL (ref 0.44–1.00)
GFR calc Af Amer: >60 mL/min (ref >60)
GLUCOSE: 107 mg/dL — AB (ref 65–99)
POTASSIUM: 3.2 mmol/L — AB (ref 3.5–5.1)
Sodium: 142 mmol/L (ref 135–145)

## 2015-01-19 LAB — CBC
HCT: 30.3 % — ABNORMAL LOW (ref 36.0–46.0)
Hemoglobin: 10.1 g/dL — ABNORMAL LOW (ref 12.0–15.0)
MCH: 31.7 pg (ref 26.0–34.0)
MCHC: 33.3 g/dL (ref 30.0–36.0)
MCV: 95 fL (ref 78.0–100.0)
PLATELETS: 214 10*3/uL (ref 150–400)
RBC: 3.19 MIL/uL — AB (ref 3.87–5.11)
RDW: 13.7 % (ref 11.5–15.5)
WBC: 11.1 10*3/uL — ABNORMAL HIGH (ref 4.0–10.5)

## 2015-01-19 NOTE — Progress Notes (Signed)
Physical Therapy Treatment Patient Details Name: Sheena Taylor MRN: [id number] DOB: [birth year] Today's Date: 01/19/2015    History of Present Illness s/p R TKA    PT Comments    Pt making excellent progress with PT, will need f/u HHPT  Follow Up Recommendations  Home health PT     Equipment Recommendations  Rolling walker with 5" wheels    Recommendations for Other Services       Precautions / Restrictions Precautions Precautions: Knee Restrictions Other Position/Activity Restrictions: WBAT    Mobility  Bed Mobility               General bed mobility comments: in chair.   Transfers Overall transfer level: Needs assistance Equipment used: Rolling walker (2 wheeled) Transfers: Sit to/from Stand Sit to Stand: Min guard         General transfer comment: cues for hand placement.  Ambulation/Gait Ambulation/Gait assistance: Supervision Ambulation Distance (Feet): 100 Feet Assistive device: Rolling walker (2 wheeled) Gait Pattern/deviations: Step-to pattern;Step-through pattern;Antalgic     General Gait Details: cues for sequence, step length, safety;    Stairs Stairs: Yes Stairs assistance: Min assist Stair Management: One rail Right;Forwards;With walker Number of Stairs: 1 (x2) General stair comments: cues for safety and sequence  Wheelchair Mobility    Modified Rankin (Stroke Patients Only)       Balance                                    Cognition Arousal/Alertness: Awake/alert Behavior During Therapy: WFL for tasks assessed/performed Overall Cognitive Status: Within Functional Limits for tasks assessed                      Exercises Total Joint Exercises Ankle Circles/Pumps: AROM;Both;10 reps Quad Sets: 10 reps;Both;AROM Knee Flexion: AAROM;Right;10 reps;Seated Goniometric ROM: grossly 10 to 100* flexion in sitting    General Comments General comments (skin integrity, edema, etc.):        Pertinent Vitals/Pain Pain Assessment: 0-10 Pain Score: 3  Pain Location: right knee Pain Descriptors / Indicators: Sore Pain Intervention(s): Limited activity within patient's tolerance;Monitored during session;Premedicated before session;Ice applied    Home Living Family/patient expects to be discharged to:: Private residence Living Arrangements: Spouse/significant other   Type of Home: House Home Access: Stairs to enter   Home Layout: Laundry or work area in [place]: Kasandra Knudsen - single point;Bedside commode Additional Comments: BSC already taken home.    Prior Function            PT Goals (current goals can now be found in the care plan section) Acute Rehab PT Goals Patient Stated Goal: return to independence PT Goal Formulation: With patient Time For Goal Achievement: 01/21/15 Potential to Achieve Goals: Good Progress towards PT goals: Progressing toward goals    Frequency  7X/week    PT Plan Current plan remains appropriate    Co-evaluation             End of Session Equipment Utilized During Treatment: Gait belt Activity Tolerance: Patient tolerated treatment well Patient left: in chair;with call bell/phone within reach;with family/visitor present     Time: 9211-9417 PT Time Calculation (min) (ACUTE ONLY): 37 min  Charges:  $Gait Training: 8-22 mins $Therapeutic Exercise: 8-22 mins                    G Codes:  [facility] 01/19/2015, 11:48 AM

## 2015-01-19 NOTE — Progress Notes (Signed)
     Subjective: 2 Days Post-Op Procedure(s) (LRB): TOTAL RIGHT KNEE ARTHROPLASTY (Right)   Patient reports pain as mild, pain controlled. No events throughout the night. Pain issues from yesterday, better today.  Ready to be discharged home.   Objective:   VITALS:   Filed Vitals:   01/19/15 0542  BP: 123/45  Pulse: 65  Temp: 98.1 F (36.7 C)  Resp: 16    Dorsiflexion/Plantar flexion intact Incision: dressing C/D/I No cellulitis present Compartment soft  LABS  Recent Labs  01/18/15 0510 01/19/15 0433  HGB 11.0* 10.1*  HCT 33.7* 30.3*  WBC 14.8* 11.1*  PLT 220 214     Recent Labs  01/18/15 0510 01/19/15 0433  NA 141 142  K 3.2* 3.2*  BUN 12 12  CREATININE 0.57 0.52  GLUCOSE 125* 107*     Assessment/Plan: 2 Days Post-Op Procedure(s) (LRB): TOTAL RIGHT KNEE ARTHROPLASTY (Right) Up with therapy Discharge home with home health  Follow up in 2 weeks at [facility] Follow up with OLIN,Caoilainn Sacks D in 2 weeks.  Contact information:  [facility] [address] [place] [phone]    Obese (BMI 30-39.9) Estimated body mass index is 30.12 kg/(m^2) as calculated from the following:   Height as of this encounter: 5\' 3"  (1.6 m).   Weight as of this encounter: 77.111 kg (170 lb). Patient also counseled that weight may inhibit the healing process Patient counseled that losing weight will help with future health issues       [place] Pugh. Torry Istre   PAC  01/19/2015, 8:46 AM

## 2015-01-19 NOTE — Evaluation (Signed)
Occupational Therapy One Time Evaluation Patient Details Name: Sheena Taylor MRN: [id number] DOB: September 16, 1934 Today's Date: 01/19/2015    History of Present Illness s/p R TKA   Clinical Impression   Pt doing well with functional ADL but limited by pain at end of session 7/10. Had just finished PT right before OT arrived. Pt for d/c today. Pt and spouse verbalize understanding of all education and have no further questions.    Follow Up Recommendations  No OT follow up    Equipment Recommendations  None recommended by OT (3in1 already taken home.)    Recommendations for Other Services       Precautions / Restrictions Precautions Precautions: Knee Restrictions Other Position/Activity Restrictions: WBAT      Mobility Bed Mobility               General bed mobility comments: in chair.   Transfers Overall transfer level: Needs assistance Equipment used: Rolling walker (2 wheeled) Transfers: Sit to/from Stand Sit to Stand: Min guard         General transfer comment: cues for hand placement.    Balance                                            ADL Overall ADL's : Needs assistance/impaired Eating/Feeding: Independent;Sitting   Grooming: Wash/dry hands;Min guard;Standing   Upper Body Bathing: Set up;Sitting   Lower Body Bathing: Minimal assistance;Sit to/from stand   Upper Body Dressing : Set up;Sitting   Lower Body Dressing: Moderate assistance;Sit to/from stand   Toilet Transfer: Min guard;Ambulation;BSC;RW   Toileting- Water quality scientist and Hygiene: Min guard;Sit to/from stand         General ADL Comments: Pt has a tub and plans to sponge bathe until able to manage the tub. Did educate on tub transfer bench option and coverage if pt desires to shower and cant step over the tub yet. Spouse verbalizes understanding of tubbench option also. He assisted pt with LB dressing this am per his report and reports this went well.  Reviewed sequence for LB dressing and having walker in front of her when she stands to pull up clothing. Pt did well with transfer to 3in1 with min cues for turns with walker.      Vision     Perception     Praxis      Pertinent Vitals/Pain Pain Assessment: 0-10 Pain Score: 3  Pain Location: right knee Pain Descriptors / Indicators: Sore Pain Intervention(s): Limited activity within patient's tolerance;Monitored during session;Premedicated before session;Ice applied     Hand Dominance     Extremity/Trunk Assessment Upper Extremity Assessment Upper Extremity Assessment: Overall WFL for tasks assessed           Communication Communication Communication: No difficulties   Cognition Arousal/Alertness: Awake/alert Behavior During Therapy: WFL for tasks assessed/performed Overall Cognitive Status: Within Functional Limits for tasks assessed                     General Comments       Exercises       Shoulder Instructions      Home Living Family/patient expects to be discharged to:: Private residence Living Arrangements: Spouse/significant other   Type of Home: House Home Access: Stairs to enter [employer] of Steps: 1   Home Layout: Laundry or work area in basement  Bathroom Shower/Tub: Teacher, early years/pre: Standard     Home Equipment: Cane - single point;Bedside commode   Additional Comments: BSC already taken home.      Prior Functioning/Environment               OT Diagnosis: Generalized weakness   OT Problem List:     OT Treatment/Interventions:      OT Goals(Current goals can be found in the care plan section) Acute Rehab OT Goals Patient Stated Goal: return to independence OT Goal Formulation: With patient  OT Frequency:     Barriers to D/C:            Co-evaluation              End of Session Equipment Utilized During Treatment: Rolling walker  Activity Tolerance: Patient limited by  pain (increased pain at end of session. Pt due for pain meds. nursing made aware of pain level) Patient left: in chair;with call bell/phone within reach;with family/visitor present   Time: 3419-3790 OT Time Calculation (min): 18 min Charges:  OT General Charges $OT Visit: 1 Procedure OT Evaluation $Initial OT Evaluation Tier I: 1 Procedure G-Codes:    Jules Schick  [phone] 01/19/2015, 11:48 AM

## 2015-01-20 DIAGNOSIS — F329 Major depressive disorder, single episode, unspecified: Secondary | ICD-10-CM | POA: Diagnosis not present

## 2015-01-20 DIAGNOSIS — I10 Essential (primary) hypertension: Secondary | ICD-10-CM | POA: Diagnosis not present

## 2015-01-20 DIAGNOSIS — Z471 Aftercare following joint replacement surgery: Secondary | ICD-10-CM | POA: Diagnosis not present

## 2015-01-20 DIAGNOSIS — M1990 Unspecified osteoarthritis, unspecified site: Secondary | ICD-10-CM | POA: Diagnosis not present

## 2015-01-20 DIAGNOSIS — F17210 Nicotine dependence, cigarettes, uncomplicated: Secondary | ICD-10-CM | POA: Diagnosis not present

## 2015-01-20 DIAGNOSIS — H3530 Unspecified macular degeneration: Secondary | ICD-10-CM | POA: Diagnosis not present

## 2015-01-20 NOTE — Discharge Summary (Signed)
Physician Discharge Summary  Patient ID: Sheena Taylor MRN: [id number] DOB/AGE: March 09, 1935 80 y.o.  Admit date: 01/17/2015 Discharge date: 01/19/2015   Procedures:  Procedure(s) (LRB): TOTAL RIGHT KNEE ARTHROPLASTY (Right)  Attending Physician:  Dr. Paralee Cancel   Admission Diagnoses:   Right knee primary OA / pain  Discharge Diagnoses:  Principal Problem:   S/P right TKA Active Problems:   Obese  Past Medical History  Diagnosis Date  . Complication of anesthesia     Dr. Winfred Leeds had problems putting spinal in approximately 10-15 years ago and had to have general   . History of laparotomy     for ectopic pregnancy  . H/O vein stripping     bilateral  . Hammer toe of right foot     with this had amputation right 2nd toe  . Hypertension   . Depression   . Macular degeneration   . Arthritis   . S/P bunionectomy     bilateral and hammer toe-correction    HPI:    Sheena Taylor, 80 y.o. female, has a history of pain and functional disability in the right knee due to arthritis and has failed non-surgical conservative treatments for greater than 12 weeks to include NSAID's and/or analgesics, corticosteriod injections and activity modification. Onset of symptoms was gradual, starting 1+ years ago with gradually worsening course since that time. The patient noted no past surgery on the right knee(s). Patient currently rates pain in the right knee(s) at 7 out of 10 with activity. Patient has worsening of pain with activity and weight bearing, pain that interferes with activities of daily living, pain with passive range of motion, crepitus and joint swelling. Patient has evidence of periarticular osteophytes and joint space narrowing by imaging studies. There is no active infection. Risks, benefits and expectations were discussed with the patient. Risks including but not limited to the risk of anesthesia, blood clots, nerve damage, blood vessel damage, failure of the  prosthesis, infection and up to and including death. Patient understand the risks, benefits and expectations and wishes to proceed with surgery.   PCP: Precious Reel, MD   Discharged Condition: good  Hospital Course:  Patient underwent the above stated procedure on 01/17/2015. Patient tolerated the procedure well and brought to the recovery room in good condition and subsequently to the floor.  POD #1 BP: 124/48 ; Pulse: 59 ; Temp: 97.7 F (36.5 C) ; Resp: 18 Patient reports pain as moderate or so. Doing all right this am waiting for breakfast. Woody in room with her this am. Neurovascular intact and incision: dressing C/D/I  LABS  Basename    HGB  11.0  HCT  33.7   POD #2  BP: 123/45 ; Pulse: 65 ; Temp: 98.1 F (36.7 C) ; Resp: 16 Patient reports pain as mild, pain controlled. No events throughout the night. Pain issues from yesterday, better today. Ready to be discharged home.  Dorsiflexion/plantar flexion intact, incision: dressing C/D/I, no cellulitis present and compartment soft.   LABS  Basename    HGB  10.1  HCT  30.3    Discharge Exam: General appearance: alert, cooperative and no distress Extremities: Homans sign is negative, no sign of DVT, no edema, redness or tenderness in the calves or thighs and no ulcers, gangrene or trophic changes  Disposition: Home with follow up in 2 weeks   Follow-up Information    Follow up with Mauri Pole, MD. Schedule an appointment as soon as possible for a visit in  2 weeks.   Specialty:  Orthopedic Surgery   Contact information:   [address] [place] [address] [phone]       Follow up with [facility].   Why:  home health physical therapy   Contact information:   [address] [place]  [address] [phone]       Follow up with [facility].   Why:  rolling walker and 3n1   Contact information:   [address] [place]   [address] [phone]       Discharge Instructions    Call MD / Call 911    Complete by:  As directed   If you experience chest pain or shortness of breath, CALL 911 and be transported to the hospital emergency room.  If you develope a fever above 101 F, pus (white drainage) or increased drainage or redness at the wound, or calf pain, call your surgeon's office.     Change dressing    Complete by:  As directed   Maintain surgical dressing until follow up in the clinic. If the edges start to pull up, may reinforce with tape. If the dressing is no longer working, may remove and cover with gauze and tape, but must keep the area dry and clean.  Call with any questions or concerns.     Constipation Prevention    Complete by:  As directed   Drink plenty of fluids.  Prune juice may be helpful.  You may use a stool softener, such as Colace (over the counter) 100 mg twice a day.  Use MiraLax (over the counter) for constipation as needed.     Diet - low sodium heart healthy    Complete by:  As directed      Discharge instructions    Complete by:  As directed   Maintain surgical dressing until follow up in the clinic. If the edges start to pull up, may reinforce with tape. If the dressing is no longer working, may remove and cover with gauze and tape, but must keep the area dry and clean.  Follow up in 2 weeks at [facility]. Call with any questions or concerns.     Increase activity slowly as tolerated    Complete by:  As directed   Weight bearing as tolerated with assist device (walker, cane, etc) as directed, use it as long as suggested by your surgeon or therapist, typically at least 4-6 weeks.     TED hose    Complete by:  As directed   Use stockings (TED hose) for 2 weeks on both leg(s).  You may remove them at night for sleeping.             Medication List    STOP taking these medications        aspirin 81 MG tablet  Replaced by:  aspirin 325 MG EC tablet     traMADol 50  MG tablet  Commonly known as:  ULTRAM      TAKE these medications        aspirin 325 MG EC tablet  Take 1 tablet (325 mg total) by mouth 2 (two) times daily.     celecoxib 200 MG capsule  Commonly known as:  CELEBREX  Take 200 mg by mouth daily.     docusate sodium 100 MG capsule  Commonly known as:  COLACE  Take 1 capsule (100 mg total) by mouth 2 (two) times  daily.     ferrous sulfate 325 (65 FE) MG tablet  Take 1 tablet (325 mg total) by mouth 3 (three) times daily after meals.     hydrochlorothiazide 25 MG tablet  Commonly known as:  HYDRODIURIL  Take 25 mg by mouth daily.     HYDROmorphone 2 MG tablet  Commonly known as:  DILAUDID  Take 1-2 tablets (2-4 mg total) by mouth every 4 (four) hours as needed for severe pain.     Lutein 6 MG Tabs  Take 6 mg by mouth every evening.     mirtazapine 15 MG tablet  Commonly known as:  REMERON  Take 15 mg by mouth at bedtime.     polyethylene glycol packet  Commonly known as:  MIRALAX / GLYCOLAX  Take 17 g by mouth 2 (two) times daily.     pravastatin 20 MG tablet  Commonly known as:  PRAVACHOL  Take 20 mg by mouth daily.     PRESERVISION AREDS PO  Take 1 tablet by mouth 2 (two) times daily.     tiZANidine 4 MG tablet  Commonly known as:  ZANAFLEX  Take 1 tablet (4 mg total) by mouth every 6 (six) hours as needed for muscle spasms.     vitamin C 500 MG tablet  Commonly known as:  ASCORBIC ACID  Take 500 mg by mouth daily after breakfast.         Signed: West Pugh. Enio Hornback   PA-C  01/20/2015, 11:38 AM

## 2015-01-21 DIAGNOSIS — Z471 Aftercare following joint replacement surgery: Secondary | ICD-10-CM | POA: Diagnosis not present

## 2015-01-21 DIAGNOSIS — I10 Essential (primary) hypertension: Secondary | ICD-10-CM | POA: Diagnosis not present

## 2015-01-21 DIAGNOSIS — H3530 Unspecified macular degeneration: Secondary | ICD-10-CM | POA: Diagnosis not present

## 2015-01-21 DIAGNOSIS — F17210 Nicotine dependence, cigarettes, uncomplicated: Secondary | ICD-10-CM | POA: Diagnosis not present

## 2015-01-21 DIAGNOSIS — F329 Major depressive disorder, single episode, unspecified: Secondary | ICD-10-CM | POA: Diagnosis not present

## 2015-01-21 DIAGNOSIS — M1990 Unspecified osteoarthritis, unspecified site: Secondary | ICD-10-CM | POA: Diagnosis not present

## 2015-01-24 DIAGNOSIS — M1990 Unspecified osteoarthritis, unspecified site: Secondary | ICD-10-CM | POA: Diagnosis not present

## 2015-01-24 DIAGNOSIS — F17210 Nicotine dependence, cigarettes, uncomplicated: Secondary | ICD-10-CM | POA: Diagnosis not present

## 2015-01-24 DIAGNOSIS — H3530 Unspecified macular degeneration: Secondary | ICD-10-CM | POA: Diagnosis not present

## 2015-01-24 DIAGNOSIS — F329 Major depressive disorder, single episode, unspecified: Secondary | ICD-10-CM | POA: Diagnosis not present

## 2015-01-24 DIAGNOSIS — Z471 Aftercare following joint replacement surgery: Secondary | ICD-10-CM | POA: Diagnosis not present

## 2015-01-24 DIAGNOSIS — I10 Essential (primary) hypertension: Secondary | ICD-10-CM | POA: Diagnosis not present

## 2015-01-25 DIAGNOSIS — M1990 Unspecified osteoarthritis, unspecified site: Secondary | ICD-10-CM | POA: Diagnosis not present

## 2015-01-25 DIAGNOSIS — H3530 Unspecified macular degeneration: Secondary | ICD-10-CM | POA: Diagnosis not present

## 2015-01-25 DIAGNOSIS — F17210 Nicotine dependence, cigarettes, uncomplicated: Secondary | ICD-10-CM | POA: Diagnosis not present

## 2015-01-25 DIAGNOSIS — Z471 Aftercare following joint replacement surgery: Secondary | ICD-10-CM | POA: Diagnosis not present

## 2015-01-25 DIAGNOSIS — I10 Essential (primary) hypertension: Secondary | ICD-10-CM | POA: Diagnosis not present

## 2015-01-25 DIAGNOSIS — F329 Major depressive disorder, single episode, unspecified: Secondary | ICD-10-CM | POA: Diagnosis not present

## 2015-01-26 DIAGNOSIS — H3530 Unspecified macular degeneration: Secondary | ICD-10-CM | POA: Diagnosis not present

## 2015-01-26 DIAGNOSIS — M1990 Unspecified osteoarthritis, unspecified site: Secondary | ICD-10-CM | POA: Diagnosis not present

## 2015-01-26 DIAGNOSIS — Z471 Aftercare following joint replacement surgery: Secondary | ICD-10-CM | POA: Diagnosis not present

## 2015-01-26 DIAGNOSIS — F17210 Nicotine dependence, cigarettes, uncomplicated: Secondary | ICD-10-CM | POA: Diagnosis not present

## 2015-01-26 DIAGNOSIS — I10 Essential (primary) hypertension: Secondary | ICD-10-CM | POA: Diagnosis not present

## 2015-01-26 DIAGNOSIS — F329 Major depressive disorder, single episode, unspecified: Secondary | ICD-10-CM | POA: Diagnosis not present

## 2015-01-27 DIAGNOSIS — M1711 Unilateral primary osteoarthritis, right knee: Secondary | ICD-10-CM | POA: Diagnosis not present

## 2015-01-31 DIAGNOSIS — M1711 Unilateral primary osteoarthritis, right knee: Secondary | ICD-10-CM | POA: Diagnosis not present

## 2015-02-02 DIAGNOSIS — Z471 Aftercare following joint replacement surgery: Secondary | ICD-10-CM | POA: Diagnosis not present

## 2015-02-02 DIAGNOSIS — Z96651 Presence of right artificial knee joint: Secondary | ICD-10-CM | POA: Diagnosis not present

## 2015-02-03 DIAGNOSIS — M1711 Unilateral primary osteoarthritis, right knee: Secondary | ICD-10-CM | POA: Diagnosis not present

## 2015-02-08 DIAGNOSIS — M1711 Unilateral primary osteoarthritis, right knee: Secondary | ICD-10-CM | POA: Diagnosis not present

## 2015-02-10 DIAGNOSIS — M1711 Unilateral primary osteoarthritis, right knee: Secondary | ICD-10-CM | POA: Diagnosis not present

## 2015-02-15 DIAGNOSIS — M1711 Unilateral primary osteoarthritis, right knee: Secondary | ICD-10-CM | POA: Diagnosis not present

## 2015-02-17 DIAGNOSIS — M1711 Unilateral primary osteoarthritis, right knee: Secondary | ICD-10-CM | POA: Diagnosis not present

## 2015-02-22 DIAGNOSIS — M1711 Unilateral primary osteoarthritis, right knee: Secondary | ICD-10-CM | POA: Diagnosis not present

## 2015-02-24 DIAGNOSIS — M1711 Unilateral primary osteoarthritis, right knee: Secondary | ICD-10-CM | POA: Diagnosis not present

## 2015-03-01 DIAGNOSIS — M1711 Unilateral primary osteoarthritis, right knee: Secondary | ICD-10-CM | POA: Diagnosis not present

## 2015-03-03 DIAGNOSIS — M1711 Unilateral primary osteoarthritis, right knee: Secondary | ICD-10-CM | POA: Diagnosis not present

## 2015-03-07 DIAGNOSIS — M1711 Unilateral primary osteoarthritis, right knee: Secondary | ICD-10-CM | POA: Diagnosis not present

## 2015-03-09 DIAGNOSIS — Z471 Aftercare following joint replacement surgery: Secondary | ICD-10-CM | POA: Diagnosis not present

## 2015-03-09 DIAGNOSIS — Z96651 Presence of right artificial knee joint: Secondary | ICD-10-CM | POA: Diagnosis not present

## 2015-03-09 DIAGNOSIS — M1711 Unilateral primary osteoarthritis, right knee: Secondary | ICD-10-CM | POA: Diagnosis not present

## 2015-03-24 DIAGNOSIS — Z96651 Presence of right artificial knee joint: Secondary | ICD-10-CM | POA: Diagnosis not present

## 2015-03-24 DIAGNOSIS — Z471 Aftercare following joint replacement surgery: Secondary | ICD-10-CM | POA: Diagnosis not present

## 2015-04-20 DIAGNOSIS — Z96651 Presence of right artificial knee joint: Secondary | ICD-10-CM | POA: Diagnosis not present

## 2015-04-20 DIAGNOSIS — Z471 Aftercare following joint replacement surgery: Secondary | ICD-10-CM | POA: Diagnosis not present

## 2015-05-04 DIAGNOSIS — Z471 Aftercare following joint replacement surgery: Secondary | ICD-10-CM | POA: Diagnosis not present

## 2015-05-04 DIAGNOSIS — Z96651 Presence of right artificial knee joint: Secondary | ICD-10-CM | POA: Diagnosis not present

## 2015-05-19 DIAGNOSIS — Z471 Aftercare following joint replacement surgery: Secondary | ICD-10-CM | POA: Diagnosis not present

## 2015-05-19 DIAGNOSIS — M25461 Effusion, right knee: Secondary | ICD-10-CM | POA: Diagnosis not present

## 2015-05-19 DIAGNOSIS — Z96651 Presence of right artificial knee joint: Secondary | ICD-10-CM | POA: Diagnosis not present

## 2015-05-25 DIAGNOSIS — R0602 Shortness of breath: Secondary | ICD-10-CM | POA: Diagnosis not present

## 2015-05-25 DIAGNOSIS — M179 Osteoarthritis of knee, unspecified: Secondary | ICD-10-CM | POA: Diagnosis not present

## 2015-05-25 DIAGNOSIS — Z6829 Body mass index (BMI) 29.0-29.9, adult: Secondary | ICD-10-CM | POA: Diagnosis not present

## 2015-05-25 DIAGNOSIS — I10 Essential (primary) hypertension: Secondary | ICD-10-CM | POA: Diagnosis not present

## 2015-05-25 DIAGNOSIS — J209 Acute bronchitis, unspecified: Secondary | ICD-10-CM | POA: Diagnosis not present

## 2015-06-27 DIAGNOSIS — E668 Other obesity: Secondary | ICD-10-CM | POA: Diagnosis not present

## 2015-06-27 DIAGNOSIS — I10 Essential (primary) hypertension: Secondary | ICD-10-CM | POA: Diagnosis not present

## 2015-06-27 DIAGNOSIS — M179 Osteoarthritis of knee, unspecified: Secondary | ICD-10-CM | POA: Diagnosis not present

## 2015-06-27 DIAGNOSIS — Z6829 Body mass index (BMI) 29.0-29.9, adult: Secondary | ICD-10-CM | POA: Diagnosis not present

## 2015-06-27 DIAGNOSIS — R946 Abnormal results of thyroid function studies: Secondary | ICD-10-CM | POA: Diagnosis not present

## 2015-06-27 DIAGNOSIS — F329 Major depressive disorder, single episode, unspecified: Secondary | ICD-10-CM | POA: Diagnosis not present

## 2015-06-27 DIAGNOSIS — Z1389 Encounter for screening for other disorder: Secondary | ICD-10-CM | POA: Diagnosis not present

## 2015-06-27 DIAGNOSIS — E784 Other hyperlipidemia: Secondary | ICD-10-CM | POA: Diagnosis not present

## 2015-11-04 ENCOUNTER — Other Ambulatory Visit: Payer: Self-pay | Admitting: Internal Medicine

## 2015-11-04 DIAGNOSIS — Z1231 Encounter for screening mammogram for malignant neoplasm of breast: Secondary | ICD-10-CM

## 2015-12-09 ENCOUNTER — Ambulatory Visit
Admission: RE | Admit: 2015-12-09 | Discharge: 2015-12-09 | Disposition: A | Discharge status: Home / Self Care | Payer: Medicare Other | Source: Ambulatory Visit | Attending: Internal Medicine | Admitting: Internal Medicine

## 2015-12-09 DIAGNOSIS — Z1231 Encounter for screening mammogram for malignant neoplasm of breast: Secondary | ICD-10-CM

## 2016-01-02 DIAGNOSIS — R8299 Other abnormal findings in urine: Secondary | ICD-10-CM | POA: Diagnosis not present

## 2016-01-02 DIAGNOSIS — E784 Other hyperlipidemia: Secondary | ICD-10-CM | POA: Diagnosis not present

## 2016-01-02 DIAGNOSIS — I10 Essential (primary) hypertension: Secondary | ICD-10-CM | POA: Diagnosis not present

## 2016-01-02 DIAGNOSIS — N390 Urinary tract infection, site not specified: Secondary | ICD-10-CM | POA: Diagnosis not present

## 2016-01-09 DIAGNOSIS — F418 Other specified anxiety disorders: Secondary | ICD-10-CM | POA: Diagnosis not present

## 2016-01-09 DIAGNOSIS — I872 Venous insufficiency (chronic) (peripheral): Secondary | ICD-10-CM | POA: Diagnosis not present

## 2016-01-09 DIAGNOSIS — E668 Other obesity: Secondary | ICD-10-CM | POA: Diagnosis not present

## 2016-01-09 DIAGNOSIS — Z1389 Encounter for screening for other disorder: Secondary | ICD-10-CM | POA: Diagnosis not present

## 2016-01-09 DIAGNOSIS — D692 Other nonthrombocytopenic purpura: Secondary | ICD-10-CM | POA: Diagnosis not present

## 2016-01-09 DIAGNOSIS — Z1212 Encounter for screening for malignant neoplasm of rectum: Secondary | ICD-10-CM | POA: Diagnosis not present

## 2016-01-09 DIAGNOSIS — Z0000 Encounter for general adult medical examination without abnormal findings: Secondary | ICD-10-CM | POA: Diagnosis not present

## 2016-01-09 DIAGNOSIS — I10 Essential (primary) hypertension: Secondary | ICD-10-CM | POA: Diagnosis not present

## 2016-01-09 DIAGNOSIS — E784 Other hyperlipidemia: Secondary | ICD-10-CM | POA: Diagnosis not present

## 2016-01-09 DIAGNOSIS — Z6831 Body mass index (BMI) 31.0-31.9, adult: Secondary | ICD-10-CM | POA: Diagnosis not present

## 2016-01-09 DIAGNOSIS — Z23 Encounter for immunization: Secondary | ICD-10-CM | POA: Diagnosis not present

## 2016-01-09 DIAGNOSIS — R946 Abnormal results of thyroid function studies: Secondary | ICD-10-CM | POA: Diagnosis not present

## 2016-01-09 DIAGNOSIS — F329 Major depressive disorder, single episode, unspecified: Secondary | ICD-10-CM | POA: Diagnosis not present

## 2016-03-01 DIAGNOSIS — H35363 Drusen (degenerative) of macula, bilateral: Secondary | ICD-10-CM | POA: Diagnosis not present

## 2016-03-12 DIAGNOSIS — R946 Abnormal results of thyroid function studies: Secondary | ICD-10-CM | POA: Diagnosis not present

## 2016-07-09 DIAGNOSIS — N182 Chronic kidney disease, stage 2 (mild): Secondary | ICD-10-CM | POA: Diagnosis not present

## 2016-07-09 DIAGNOSIS — Z6821 Body mass index (BMI) 21.0-21.9, adult: Secondary | ICD-10-CM | POA: Diagnosis not present

## 2016-07-09 DIAGNOSIS — I129 Hypertensive chronic kidney disease with stage 1 through stage 4 chronic kidney disease, or unspecified chronic kidney disease: Secondary | ICD-10-CM | POA: Diagnosis not present

## 2016-07-09 DIAGNOSIS — R946 Abnormal results of thyroid function studies: Secondary | ICD-10-CM | POA: Diagnosis not present

## 2016-07-09 DIAGNOSIS — E784 Other hyperlipidemia: Secondary | ICD-10-CM | POA: Diagnosis not present

## 2016-07-09 DIAGNOSIS — F3289 Other specified depressive episodes: Secondary | ICD-10-CM | POA: Diagnosis not present

## 2016-07-09 DIAGNOSIS — H3530 Unspecified macular degeneration: Secondary | ICD-10-CM | POA: Diagnosis not present

## 2016-07-09 DIAGNOSIS — I10 Essential (primary) hypertension: Secondary | ICD-10-CM | POA: Diagnosis not present

## 2016-07-09 DIAGNOSIS — R7309 Other abnormal glucose: Secondary | ICD-10-CM | POA: Diagnosis not present

## 2016-07-09 DIAGNOSIS — E668 Other obesity: Secondary | ICD-10-CM | POA: Diagnosis not present

## 2016-07-09 DIAGNOSIS — R42 Dizziness and giddiness: Secondary | ICD-10-CM | POA: Diagnosis not present

## 2016-07-10 ENCOUNTER — Ambulatory Visit (HOSPITAL_COMMUNITY)
Admission: RE | Admit: 2016-07-10 | Discharge: 2016-07-10 | Disposition: A | Discharge status: Home / Self Care | Payer: Medicare Other | Source: Ambulatory Visit | Attending: Vascular Surgery | Admitting: Vascular Surgery

## 2016-07-10 ENCOUNTER — Other Ambulatory Visit (HOSPITAL_COMMUNITY): Payer: Self-pay | Admitting: Internal Medicine

## 2016-07-10 DIAGNOSIS — R42 Dizziness and giddiness: Secondary | ICD-10-CM

## 2016-10-29 ENCOUNTER — Other Ambulatory Visit: Payer: Self-pay | Admitting: Internal Medicine

## 2016-10-29 DIAGNOSIS — Z1231 Encounter for screening mammogram for malignant neoplasm of breast: Secondary | ICD-10-CM

## 2016-12-11 ENCOUNTER — Ambulatory Visit
Admission: RE | Admit: 2016-12-11 | Discharge: 2016-12-11 | Disposition: A | Discharge status: Home / Self Care | Payer: Medicare Other | Source: Ambulatory Visit | Attending: Internal Medicine | Admitting: Internal Medicine

## 2016-12-11 DIAGNOSIS — Z1231 Encounter for screening mammogram for malignant neoplasm of breast: Secondary | ICD-10-CM

## 2017-01-03 DIAGNOSIS — N390 Urinary tract infection, site not specified: Secondary | ICD-10-CM | POA: Diagnosis not present

## 2017-01-03 DIAGNOSIS — E784 Other hyperlipidemia: Secondary | ICD-10-CM | POA: Diagnosis not present

## 2017-01-03 DIAGNOSIS — R7309 Other abnormal glucose: Secondary | ICD-10-CM | POA: Diagnosis not present

## 2017-01-03 DIAGNOSIS — R8299 Other abnormal findings in urine: Secondary | ICD-10-CM | POA: Diagnosis not present

## 2017-01-03 DIAGNOSIS — R946 Abnormal results of thyroid function studies: Secondary | ICD-10-CM | POA: Diagnosis not present

## 2017-01-03 DIAGNOSIS — I10 Essential (primary) hypertension: Secondary | ICD-10-CM | POA: Diagnosis not present

## 2017-01-07 DIAGNOSIS — R946 Abnormal results of thyroid function studies: Secondary | ICD-10-CM | POA: Diagnosis not present

## 2017-01-10 DIAGNOSIS — F3289 Other specified depressive episodes: Secondary | ICD-10-CM | POA: Diagnosis not present

## 2017-01-10 DIAGNOSIS — I129 Hypertensive chronic kidney disease with stage 1 through stage 4 chronic kidney disease, or unspecified chronic kidney disease: Secondary | ICD-10-CM | POA: Diagnosis not present

## 2017-01-10 DIAGNOSIS — Z0000 Encounter for general adult medical examination without abnormal findings: Secondary | ICD-10-CM | POA: Diagnosis not present

## 2017-01-10 DIAGNOSIS — R7309 Other abnormal glucose: Secondary | ICD-10-CM | POA: Diagnosis not present

## 2017-01-10 DIAGNOSIS — R42 Dizziness and giddiness: Secondary | ICD-10-CM | POA: Diagnosis not present

## 2017-01-10 DIAGNOSIS — Z6829 Body mass index (BMI) 29.0-29.9, adult: Secondary | ICD-10-CM | POA: Diagnosis not present

## 2017-01-10 DIAGNOSIS — Z1389 Encounter for screening for other disorder: Secondary | ICD-10-CM | POA: Diagnosis not present

## 2017-01-10 DIAGNOSIS — Z23 Encounter for immunization: Secondary | ICD-10-CM | POA: Diagnosis not present

## 2017-01-10 DIAGNOSIS — R808 Other proteinuria: Secondary | ICD-10-CM | POA: Diagnosis not present

## 2017-01-10 DIAGNOSIS — D692 Other nonthrombocytopenic purpura: Secondary | ICD-10-CM | POA: Diagnosis not present

## 2017-01-10 DIAGNOSIS — E784 Other hyperlipidemia: Secondary | ICD-10-CM | POA: Diagnosis not present

## 2017-01-10 DIAGNOSIS — N182 Chronic kidney disease, stage 2 (mild): Secondary | ICD-10-CM | POA: Diagnosis not present

## 2017-01-17 DIAGNOSIS — Z1212 Encounter for screening for malignant neoplasm of rectum: Secondary | ICD-10-CM | POA: Diagnosis not present

## 2017-03-13 DIAGNOSIS — H353132 Nonexudative age-related macular degeneration, bilateral, intermediate dry stage: Secondary | ICD-10-CM | POA: Diagnosis not present

## 2017-07-12 DIAGNOSIS — E7849 Other hyperlipidemia: Secondary | ICD-10-CM | POA: Diagnosis not present

## 2017-07-12 DIAGNOSIS — R946 Abnormal results of thyroid function studies: Secondary | ICD-10-CM | POA: Diagnosis not present

## 2017-07-12 DIAGNOSIS — F3289 Other specified depressive episodes: Secondary | ICD-10-CM | POA: Diagnosis not present

## 2017-07-12 DIAGNOSIS — F418 Other specified anxiety disorders: Secondary | ICD-10-CM | POA: Diagnosis not present

## 2017-07-12 DIAGNOSIS — R7309 Other abnormal glucose: Secondary | ICD-10-CM | POA: Diagnosis not present

## 2017-07-12 DIAGNOSIS — I129 Hypertensive chronic kidney disease with stage 1 through stage 4 chronic kidney disease, or unspecified chronic kidney disease: Secondary | ICD-10-CM | POA: Diagnosis not present

## 2017-07-12 DIAGNOSIS — Z6829 Body mass index (BMI) 29.0-29.9, adult: Secondary | ICD-10-CM | POA: Diagnosis not present

## 2017-07-12 DIAGNOSIS — D692 Other nonthrombocytopenic purpura: Secondary | ICD-10-CM | POA: Diagnosis not present

## 2017-07-12 DIAGNOSIS — H3530 Unspecified macular degeneration: Secondary | ICD-10-CM | POA: Diagnosis not present

## 2017-07-12 DIAGNOSIS — E668 Other obesity: Secondary | ICD-10-CM | POA: Diagnosis not present

## 2017-07-12 DIAGNOSIS — N182 Chronic kidney disease, stage 2 (mild): Secondary | ICD-10-CM | POA: Diagnosis not present

## 2017-07-12 DIAGNOSIS — M1990 Unspecified osteoarthritis, unspecified site: Secondary | ICD-10-CM | POA: Diagnosis not present

## 2017-07-23 DIAGNOSIS — Z6829 Body mass index (BMI) 29.0-29.9, adult: Secondary | ICD-10-CM | POA: Diagnosis not present

## 2017-07-23 DIAGNOSIS — J181 Lobar pneumonia, unspecified organism: Secondary | ICD-10-CM | POA: Diagnosis not present

## 2017-07-23 DIAGNOSIS — R05 Cough: Secondary | ICD-10-CM | POA: Diagnosis not present

## 2017-10-31 ENCOUNTER — Other Ambulatory Visit: Payer: Self-pay | Admitting: Internal Medicine

## 2017-10-31 DIAGNOSIS — Z1231 Encounter for screening mammogram for malignant neoplasm of breast: Secondary | ICD-10-CM

## 2017-11-07 ENCOUNTER — Encounter: Payer: Self-pay | Admitting: Podiatry

## 2017-11-07 ENCOUNTER — Ambulatory Visit (INDEPENDENT_AMBULATORY_CARE_PROVIDER_SITE_OTHER): Payer: Medicare Other | Admitting: Podiatry

## 2017-11-07 ENCOUNTER — Ambulatory Visit (INDEPENDENT_AMBULATORY_CARE_PROVIDER_SITE_OTHER): Payer: Medicare Other

## 2017-11-07 ENCOUNTER — Other Ambulatory Visit: Payer: Self-pay | Admitting: Podiatry

## 2017-11-07 VITALS — BP 136/68 | HR 68 | Resp 16

## 2017-11-07 DIAGNOSIS — M779 Enthesopathy, unspecified: Principal | ICD-10-CM

## 2017-11-07 DIAGNOSIS — M778 Other enthesopathies, not elsewhere classified: Secondary | ICD-10-CM

## 2017-11-07 DIAGNOSIS — M2041 Other hammer toe(s) (acquired), right foot: Secondary | ICD-10-CM

## 2017-11-07 DIAGNOSIS — Q828 Other specified congenital malformations of skin: Secondary | ICD-10-CM

## 2017-11-07 DIAGNOSIS — M2042 Other hammer toe(s) (acquired), left foot: Principal | ICD-10-CM

## 2017-11-07 NOTE — Progress Notes (Signed)
Subjective:  Patient ID: MEMORY HEINRICHS, female    DOB: 05-19-1934,  MRN: [id number] HPI Chief Complaint  Patient presents with  . Foot Pain    Plantar forefoot bilateral (L>R) - callused, tender areas x years, very tender walking, noticed it has altered her gait, previous foot surgeries by Dr. Collie Siad and Beola Cord for bunions and hammer toes, has tried padding, Good Feet orthotics, OTC orthotics, custom orthotics, meds for corns, files down (no help)   . NOTE    Patient's husband is a retired Nature conservation officer   . New Patient (Initial Visit)    83 y.o. female presents with the above complaint.   ROS: Denies fever chills nausea vomiting muscle aches pains calf pain back pain chest pain shortness of breath.  Past Medical History:  Diagnosis Date  . Arthritis   . Complication of anesthesia    Dr. Winfred Leeds had problems putting spinal in approximately 10-15 years ago and had to have general   . Depression   . H/O vein stripping    bilateral  . 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 History of laparotomy    for ectopic pregnancy  . Hypertension   . Macular degeneration   . S/P bunionectomy    bilateral and hammer toe-correction   Past Surgical History:  Procedure Laterality Date  . ABDOMINAL HYSTERECTOMY     partial  . BREAST EXCISIONAL BIOPSY Left   . BREAST EXCISIONAL BIOPSY Right   . BUNIONECTOMY     bilateral with hammer toe and amputation 2nd right toe with this  . LAPAROTOMY     for ruptured ectopic pregnancy  . TOTAL KNEE ARTHROPLASTY Right 01/17/2015   Procedure: TOTAL RIGHT KNEE ARTHROPLASTY;  Surgeon: Paralee Cancel, MD;  Location: [facility];  Service: Orthopedics;  Laterality: Right;  Marland Kitchen VARICOSE VEIN SURGERY     bilateral    Current Outpatient Medications:  .  hydrochlorothiazide (HYDRODIURIL) 25 MG tablet, Take 25 mg by mouth daily., Disp: , Rfl:  .  Lutein 6 MG TABS, Take 6 mg by mouth every evening., Disp: , Rfl:  .  Multiple  Vitamins-Minerals (PRESERVISION AREDS PO), Take 1 tablet by mouth 2 (two) times daily., Disp: , Rfl:  .  pravastatin (PRAVACHOL) 20 MG tablet, Take 20 mg by mouth daily., Disp: , Rfl:  .  vitamin C (ASCORBIC ACID) 500 MG tablet, Take 500 mg by mouth daily after breakfast., Disp: , Rfl:   Allergies  Allergen Reactions  . Codeine Nausea And Vomiting   Review of Systems Objective:   Vitals:   11/07/17 0825  BP: 136/68  Pulse: 68  Resp: 16    General: Well developed, nourished, in no acute distress, alert and oriented x3   Dermatological: Skin is warm, dry and supple bilateral. Nails x 10 are well maintained; remaining integument appears unremarkable at this time. There are no open sores, no preulcerative lesions, no rash or signs of infection present.  Reactive hyper keratomas sub-second metatarsal phalangeal joint bilateral.  Left demonstrates a small papilloma which is encompassed in the callus.  Its vascular and easily easily believable.  Does not demonstrate any signs of infection.  Just to the right of that is a poor keratoma which is exquisitely painful.  I debrided that for her today without iatrogenic lesion.  Vascular: Dorsalis Pedis artery and Posterior Tibial artery pedal pulses are 2/4 bilateral with immedate capillary fill time. Pedal hair growth present. No  varicosities and no lower extremity edema present bilateral.   Neruologic: Grossly intact via light touch bilateral. Vibratory intact via tuning fork bilateral. Protective threshold with Semmes Wienstein monofilament intact to all pedal sites bilateral. Patellar and Achilles deep tendon reflexes 2+ bilateral. No Babinski or clonus noted bilateral.   Musculoskeletal: No gross boney pedal deformities bilateral. No pain, crepitus, or limitation noted with foot and ankle range of motion bilateral. Muscular strength 5/5 in all groups tested bilateral.  Gait: Unassisted, Nonantalgic.    Radiographs:  Radiographs taken today  demonstrate an osseously mature individual with moderate osteopenia severe digital deformities amputation of the second toe of the right foot at the level of the metatarsal head.  Screws are retrained to the first metatarsal bilaterally and the second metatarsal head bilaterally no acute findings are noted.   Assessment & Plan:   Assessment: Plantarflexed second metatarsal left with hammertoe deformity resulting in porokeratotic lesion left and gait disturbance.  Plan: Discussed etiology pathology conservative versus surgical therapies.  Sharply debrided the lesion today and she will follow-up with [facility] for orthotics.  She has had custom orthotics made in the past and her husband is an Customer service manager.  Most of her orthotics consisted of a metatarsal bar but never had an offload pocket to help take the pressure off of the second metatarsal phalangeal joint.  She needs to have an offloaded area beneath the second metatarsal phalangeal joint.  This needs to be as deep as possible.     Erica Richwine T. [place], [place]

## 2017-11-12 ENCOUNTER — Ambulatory Visit (INDEPENDENT_AMBULATORY_CARE_PROVIDER_SITE_OTHER): Payer: Medicare Other | Admitting: Orthotics

## 2017-11-12 DIAGNOSIS — M779 Enthesopathy, unspecified: Secondary | ICD-10-CM

## 2017-11-12 DIAGNOSIS — M778 Other enthesopathies, not elsewhere classified: Secondary | ICD-10-CM

## 2017-11-12 NOTE — Progress Notes (Signed)
Patient came into today to be cast for Custom Foot Orthotics. Upon recommendation of Dr. Milinda Pointer Patient presents with capsulitis, metatarsalgia, keratomas Goals are forefoot cushioning/offloading Plan vendor LEVY  Patient signed ABN

## 2017-11-26 ENCOUNTER — Encounter: Payer: Medicare Other | Admitting: Orthotics

## 2017-11-28 ENCOUNTER — Ambulatory Visit (INDEPENDENT_AMBULATORY_CARE_PROVIDER_SITE_OTHER): Payer: Medicare Other | Admitting: Orthotics

## 2017-11-28 DIAGNOSIS — Q828 Other specified congenital malformations of skin: Secondary | ICD-10-CM

## 2017-11-28 DIAGNOSIS — M779 Enthesopathy, unspecified: Principal | ICD-10-CM

## 2017-11-28 DIAGNOSIS — M778 Other enthesopathies, not elsewhere classified: Secondary | ICD-10-CM

## 2017-11-28 NOTE — Progress Notes (Signed)
Patient came in today to pick up custom made foot orthotics.  The goals were accomplished and the patient reported no dissatisfaction with said orthotics.  Patient was advised of breakin period and how to report any issues. 

## 2017-12-12 ENCOUNTER — Ambulatory Visit: Payer: Medicare Other | Admitting: Orthotics

## 2017-12-12 DIAGNOSIS — Q828 Other specified congenital malformations of skin: Secondary | ICD-10-CM

## 2017-12-12 DIAGNOSIS — M779 Enthesopathy, unspecified: Principal | ICD-10-CM

## 2017-12-12 DIAGNOSIS — M778 Other enthesopathies, not elsewhere classified: Secondary | ICD-10-CM

## 2017-12-12 NOTE — Progress Notes (Signed)
Need to change topcover from zote to p-lite, need to move offload more distal, add 4* varus post.

## 2017-12-17 ENCOUNTER — Ambulatory Visit: Payer: Medicare Other

## 2018-01-06 ENCOUNTER — Encounter: Payer: Medicare Other | Admitting: Orthotics

## 2018-01-07 ENCOUNTER — Encounter: Payer: Medicare Other | Admitting: Orthotics

## 2018-01-07 DIAGNOSIS — R7309 Other abnormal glucose: Secondary | ICD-10-CM | POA: Diagnosis not present

## 2018-01-07 DIAGNOSIS — E7849 Other hyperlipidemia: Secondary | ICD-10-CM | POA: Diagnosis not present

## 2018-01-07 DIAGNOSIS — R82998 Other abnormal findings in urine: Secondary | ICD-10-CM | POA: Diagnosis not present

## 2018-01-07 DIAGNOSIS — R946 Abnormal results of thyroid function studies: Secondary | ICD-10-CM | POA: Diagnosis not present

## 2018-01-07 DIAGNOSIS — I10 Essential (primary) hypertension: Secondary | ICD-10-CM | POA: Diagnosis not present

## 2018-01-14 DIAGNOSIS — Z1212 Encounter for screening for malignant neoplasm of rectum: Secondary | ICD-10-CM | POA: Diagnosis not present

## 2018-01-15 ENCOUNTER — Ambulatory Visit
Admission: RE | Admit: 2018-01-15 | Discharge: 2018-01-15 | Disposition: A | Discharge status: Home / Self Care | Payer: Medicare Other | Source: Ambulatory Visit | Attending: Internal Medicine | Admitting: Internal Medicine

## 2018-01-15 DIAGNOSIS — Z1231 Encounter for screening mammogram for malignant neoplasm of breast: Secondary | ICD-10-CM

## 2018-01-16 ENCOUNTER — Ambulatory Visit: Payer: Medicare Other | Admitting: Orthotics

## 2018-01-16 DIAGNOSIS — Z23 Encounter for immunization: Secondary | ICD-10-CM | POA: Diagnosis not present

## 2018-01-16 DIAGNOSIS — R809 Proteinuria, unspecified: Secondary | ICD-10-CM | POA: Diagnosis not present

## 2018-01-16 DIAGNOSIS — Z1389 Encounter for screening for other disorder: Secondary | ICD-10-CM | POA: Diagnosis not present

## 2018-01-16 DIAGNOSIS — F3289 Other specified depressive episodes: Secondary | ICD-10-CM | POA: Diagnosis not present

## 2018-01-16 DIAGNOSIS — D692 Other nonthrombocytopenic purpura: Secondary | ICD-10-CM | POA: Diagnosis not present

## 2018-01-16 DIAGNOSIS — E668 Other obesity: Secondary | ICD-10-CM | POA: Diagnosis not present

## 2018-01-16 DIAGNOSIS — E7849 Other hyperlipidemia: Secondary | ICD-10-CM | POA: Diagnosis not present

## 2018-01-16 DIAGNOSIS — I129 Hypertensive chronic kidney disease with stage 1 through stage 4 chronic kidney disease, or unspecified chronic kidney disease: Secondary | ICD-10-CM | POA: Diagnosis not present

## 2018-01-16 DIAGNOSIS — M778 Other enthesopathies, not elsewhere classified: Secondary | ICD-10-CM

## 2018-01-16 DIAGNOSIS — N182 Chronic kidney disease, stage 2 (mild): Secondary | ICD-10-CM | POA: Diagnosis not present

## 2018-01-16 DIAGNOSIS — R7309 Other abnormal glucose: Secondary | ICD-10-CM | POA: Diagnosis not present

## 2018-01-16 DIAGNOSIS — H3530 Unspecified macular degeneration: Secondary | ICD-10-CM | POA: Diagnosis not present

## 2018-01-16 DIAGNOSIS — I77819 Aortic ectasia, unspecified site: Secondary | ICD-10-CM | POA: Diagnosis not present

## 2018-01-16 DIAGNOSIS — Z0000 Encounter for general adult medical examination without abnormal findings: Secondary | ICD-10-CM | POA: Diagnosis not present

## 2018-01-16 DIAGNOSIS — M779 Enthesopathy, unspecified: Principal | ICD-10-CM

## 2018-01-16 NOTE — Progress Notes (Signed)
Adjusted f/o by adding valgus RF post.

## 2018-02-18 DIAGNOSIS — Z13820 Encounter for screening for osteoporosis: Secondary | ICD-10-CM | POA: Diagnosis not present

## 2018-04-29 DIAGNOSIS — H353132 Nonexudative age-related macular degeneration, bilateral, intermediate dry stage: Secondary | ICD-10-CM | POA: Diagnosis not present

## 2018-04-29 DIAGNOSIS — H18413 Arcus senilis, bilateral: Secondary | ICD-10-CM | POA: Diagnosis not present

## 2018-04-29 DIAGNOSIS — H524 Presbyopia: Secondary | ICD-10-CM | POA: Diagnosis not present

## 2018-04-29 DIAGNOSIS — H2513 Age-related nuclear cataract, bilateral: Secondary | ICD-10-CM | POA: Diagnosis not present

## 2018-04-29 DIAGNOSIS — H5203 Hypermetropia, bilateral: Secondary | ICD-10-CM | POA: Diagnosis not present

## 2018-04-29 DIAGNOSIS — H3530 Unspecified macular degeneration: Secondary | ICD-10-CM | POA: Diagnosis not present

## 2018-04-29 DIAGNOSIS — H52223 Regular astigmatism, bilateral: Secondary | ICD-10-CM | POA: Diagnosis not present

## 2018-04-29 DIAGNOSIS — H43813 Vitreous degeneration, bilateral: Secondary | ICD-10-CM | POA: Diagnosis not present

## 2018-06-09 DIAGNOSIS — Z96651 Presence of right artificial knee joint: Secondary | ICD-10-CM | POA: Diagnosis not present

## 2018-06-09 DIAGNOSIS — M25562 Pain in left knee: Secondary | ICD-10-CM | POA: Diagnosis not present

## 2018-06-09 DIAGNOSIS — M1712 Unilateral primary osteoarthritis, left knee: Secondary | ICD-10-CM | POA: Diagnosis not present

## 2018-06-30 DIAGNOSIS — M1712 Unilateral primary osteoarthritis, left knee: Secondary | ICD-10-CM | POA: Diagnosis not present

## 2018-06-30 DIAGNOSIS — M25562 Pain in left knee: Secondary | ICD-10-CM | POA: Diagnosis not present

## 2018-09-19 DIAGNOSIS — M1712 Unilateral primary osteoarthritis, left knee: Secondary | ICD-10-CM | POA: Diagnosis not present

## 2018-09-19 DIAGNOSIS — M25562 Pain in left knee: Secondary | ICD-10-CM | POA: Diagnosis not present

## 2018-11-30 NOTE — H&P (Signed)
TOTAL KNEE ADMISSION H&P  Patient is being admitted for left total knee arthroplasty.  Subjective:  Chief Complaint:   Left knee primary OA / pain  HPI: Sheena Taylor, 84 y.o. female, has a history of pain and functional disability in the left knee due to arthritis and has failed non-surgical conservative treatments for greater than 12 weeks to include NSAID's and/or analgesics, corticosteriod injections and activity modification.  Onset of symptoms was gradual, starting 1+ years ago with gradually worsening course since that time. The patient noted prior procedures on the knee to include  arthroplasty on the right knee(s).  Patient currently rates pain in the left knee(s) at 9 out of 10 with activity. Patient has night pain, worsening of pain with activity and weight bearing, pain that interferes with activities of daily living, pain with passive range of motion, crepitus and joint swelling.  Patient has evidence of periarticular osteophytes and joint space narrowing by imaging studies.  There is no active infection.  Risks, benefits and expectations were discussed with the patient.  Risks including but not limited to the risk of anesthesia, blood clots, nerve damage, blood vessel damage, failure of the prosthesis, infection and up to and including death.  Patient understand the risks, benefits and expectations and wishes to proceed with surgery.    PCP: [name], MD  D/C Plans:       Home   Post-op Meds:       No Rx given  Tranexamic Acid:      To be given - IV   Decadron:      Is to be given  FYI:      ASA  Dilaudid (can't tolerate Codeine)  DME:   Pt already has equipment   PT:   OPPT    Pharmacy: [facility]    Patient Active Problem List   Diagnosis Date Noted  . Obese 01/19/2015  . S/P right TKA 01/17/2015   Past Medical History:  Diagnosis Date  . Arthritis   . Complication of anesthesia    Dr. Winfred Leeds had problems putting spinal in approximately 10-15  years ago and had to have general   . Depression   . H/O vein stripping    bilateral  . 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 History of laparotomy    for ectopic pregnancy  . Hypertension   . Macular degeneration   . S/P bunionectomy    bilateral and hammer toe-correction    Past Surgical History:  Procedure Laterality Date  . ABDOMINAL HYSTERECTOMY     partial  . BREAST EXCISIONAL BIOPSY Left   . BREAST EXCISIONAL BIOPSY Right   . BUNIONECTOMY     bilateral with hammer toe and amputation 2nd right toe with this  . LAPAROTOMY     for ruptured ectopic pregnancy  . TOTAL KNEE ARTHROPLASTY Right 01/17/2015   Procedure: TOTAL RIGHT KNEE ARTHROPLASTY;  Surgeon: Paralee Cancel, MD;  Location: [facility];  Service: Orthopedics;  Laterality: Right;  Marland Kitchen VARICOSE VEIN SURGERY     bilateral    No current facility-administered medications for this encounter.    Current Outpatient Medications  Medication Sig Dispense Refill Last Dose  . hydrochlorothiazide (HYDRODIURIL) 25 MG tablet Take 25 mg by mouth daily.   01/16/2015 at Unknown time  . Lutein 6 MG TABS Take 6 mg by mouth every evening.   01/10/2015  . Multiple Vitamins-Minerals (PRESERVISION AREDS PO) Take  1 tablet by mouth 2 (two) times daily.   01/10/2015  . pravastatin (PRAVACHOL) 20 MG tablet Take 20 mg by mouth daily.   01/16/2015 at Unknown time  . vitamin C (ASCORBIC ACID) 500 MG tablet Take 500 mg by mouth daily after breakfast.   01/10/2015   Allergies  Allergen Reactions  . Codeine Nausea And Vomiting    Social History   Tobacco Use  . Smoking status: Former Smoker    Types: Cigarettes  . Smokeless tobacco: Never Used  . Tobacco comment: smokes about once a week when having a mixed drink   Substance Use Topics  . Alcohol use: Yes    Comment: occassionally    Family History  Problem Relation Age of Onset  . Breast cancer Neg Hx      Review of Systems  Constitutional: Negative.   HENT:  Negative.   Eyes: Negative.   Respiratory: Negative.   Cardiovascular: Negative.   Gastrointestinal: Negative.   Genitourinary: Negative.   Musculoskeletal: Positive for joint pain.  Skin: Negative.   Neurological: Negative.   Endo/Heme/Allergies: Negative.   Psychiatric/Behavioral: Positive for depression.    Objective:  Physical Exam  Constitutional: She is oriented to person, place, and time. She appears well-developed.  HENT:  Head: Normocephalic.  Eyes: Pupils are equal, round, and reactive to light.  Neck: Neck supple. No JVD present. No tracheal deviation present. No thyromegaly present.  Cardiovascular: Normal rate, regular rhythm and intact distal pulses.  Murmur heard. Respiratory: Effort normal and breath sounds normal. No respiratory distress. She has no wheezes.  GI: Soft. There is no abdominal tenderness. There is no guarding.  Musculoskeletal:     Left knee: She exhibits decreased range of motion, swelling and bony tenderness. She exhibits no ecchymosis, no deformity, no laceration and no erythema. Tenderness found.  Lymphadenopathy:    She has no cervical adenopathy.  Neurological: She is alert and oriented to person, place, and time.  Skin: Skin is warm and dry.  Psychiatric: She has a normal mood and affect.     Labs:  Estimated body mass index is 30.11 kg/m as calculated from the following:   Height as of 01/17/15: 5\' 3"  (1.6 m).   Weight as of 01/17/15: 77.1 kg.   Imaging Review Plain radiographs demonstrate severe degenerative joint disease of the left knee. The bone quality appears to be good for age and reported activity level.      Assessment/Plan:  End stage arthritis, left knee   The patient history, physical examination, clinical judgment of the provider and imaging studies are consistent with end stage degenerative joint disease of the left knee(s) and total knee arthroplasty is deemed medically necessary. The treatment options including  medical management, injection therapy arthroscopy and arthroplasty were discussed at length. The risks and benefits of total knee arthroplasty were presented and reviewed. The risks due to aseptic loosening, infection, stiffness, patella tracking problems, thromboembolic complications and other imponderables were discussed. The patient acknowledged the explanation, agreed to proceed with the plan and consent was signed. Patient is being admitted for inpatient treatment for surgery, pain control, PT, OT, prophylactic antibiotics, VTE prophylaxis, progressive ambulation and ADL's and discharge planning. The patient is planning to be discharged home.     Patient's anticipated LOS is less than 2 midnights, meeting these requirements: - Lives within 1 hour of care - Has a competent adult at home to recover with post-op recover - NO history of  - Chronic pain requiring opiods  -  Diabetes  - Coronary Artery Disease  - Heart failure  - Heart attack  - Stroke  - DVT/VTE  - Cardiac arrhythmia  - Respiratory Failure/COPD  - Renal failure  - Anemia  - Advanced Liver disease    West Pugh. Lundon Rosier   PA-C  11/30/2018, 10:43 PM

## 2018-12-04 NOTE — Patient Instructions (Addendum)
YOU NEED TO HAVE A COVID 19 TEST ON__Friday 8/28_____ @_9 :00______, THIS TEST MUST BE DONE BEFORE SURGERY, COME  [place] , [id number]. ONCE YOUR COVID TEST IS COMPLETED, PLEASE BEGIN THE QUARANTINE INSTRUCTIONS AS OUTLINED IN YOUR HANDOUT.                Sheena Taylor     Your procedure is scheduled on: Tuesday 12/16/18   Report to [facility] Main  Entrance and report to Short Stay at 5:30 AM    [address].  NO VISITORS ARE ALLOWED IN SHORT STAY OR RECOVERY ROOM.   Call this number if you have problems the morning of surgery [place] AND RINSE YOUR MOUTH OUT, NO CHEWING GUM CANDY OR MINTS.   Do not eat food After Midnight .  YOU MAY HAVE CLEAR LIQUIDS FROM MIDNIGHT UNTIL 4:30AM.   At 4:30AM Please finish the prescribed Pre-Surgery drink.  Nothing by mouth after you finish the Gatorade drink ! 4:30 AM.     Take these medicines the morning of surgery with A SIP OF WATER: none                                You may not have any metal on your body including hair pins and              piercings              Do not wear jewelry, make-up, lotions, powders or perfumes, deodorant             Do not wear nail polish.  Do not shave  48 hours prior to surgery.     Do not bring valuables to the hospital. [place].  Contacts, dentures or bridgework may not be worn into surgery.      Patients discharged the day of surgery will not be allowed to drive home . IF YOU ARE HAVING SURGERY AND GOING HOME THE SAME DAY, YOU MUST HAVE AN ADULT TO DRIVE YOU HOME AND BE WITH YOU FOR 24 HOURS.  YOU MAY GO HOME BY TAXI OR UBER OR ORTHERWISE, BUT AN ADULT MUST ACCOMPANY YOU HOME AND STAY WITH YOU FOR 24 HOURS.  Name and phone number of your driver:  Special Instructions: N/A              Please read over the  following fact sheets you were given: _____________________________________________________________________             [facility] - Preparing for Surgery  Before surgery, you can play an important role.   Because skin is not sterile, your skin needs to be as free of germs as possible.   You can reduce the number of germs on your skin by washing with CHG (chlorahexidine gluconate) soap before surgery.   CHG is an antiseptic cleaner which kills germs and bonds with the skin to continue killing germs even after washing. Please DO NOT use if you have an allergy to CHG or antibacterial soaps.   If your skin becomes reddened/irritated stop using the CHG and inform your nurse when you arrive at Short Stay. Do not shave (including  legs and underarms) for at least 48 hours prior to the first CHG shower.    Please follow these instructions carefully:  1.  Shower with CHG Soap the night before surgery and the  morning of Surgery.  2.  If you choose to wash your hair, wash your hair first as usual with your  normal  shampoo.  3.  After you shampoo, rinse your hair and body thoroughly to remove the  shampoo.                                        4.  Use CHG as you would any other liquid soap.  You can apply chg directly  to the skin and wash                       Gently with a scrungie or clean washcloth.  5.  Apply the CHG Soap to your body ONLY FROM THE NECK DOWN.   Do not use on face/ open                           Wound or open sores. Avoid contact with eyes, ears mouth and genitals (private parts).                       Wash face,  Genitals (private parts) with your normal soap.             6.  Wash thoroughly, paying special attention to the area where your surgery  will be performed.  7.  Thoroughly rinse your body with warm water from the neck down.  8.  DO NOT shower/wash with your normal soap after using and rinsing off  the CHG Soap.             9.  Pat yourself dry with a clean towel.             10.  Wear clean pajamas.            11.  Place clean sheets on your bed the night of your first shower and do not  sleep with pets. Day of Surgery : Do not apply any lotions/deodorants the morning of surgery.  Please wear clean clothes to the hospital/surgery center.   FAILURE TO FOLLOW THESE INSTRUCTIONS MAY RESULT IN THE CANCELLATION OF YOUR SURGERY PATIENT SIGNATURE_________________________________  NURSE SIGNATURE__________________________________  ________________________________________________________________________   Sheena Taylor  An incentive spirometer is a tool that can help keep your lungs clear and active. This tool measures how well you are filling your lungs with each breath. Taking long deep breaths may help reverse or decrease the chance of developing breathing (pulmonary) problems (especially infection) following:  A long period of time when you are unable to move or be active. BEFORE THE PROCEDURE   If the spirometer includes an indicator to show your best effort, your nurse or respiratory therapist will set it to a desired goal.  If possible, sit up straight or lean slightly forward. Try not to slouch.  Hold the incentive spirometer in an upright position. INSTRUCTIONS FOR USE  1. Sit on the edge of your bed if possible, or sit up as far as you can in bed or on a chair. 2. Hold the incentive spirometer in an upright position. 3. Breathe out normally. 4.  Place the mouthpiece in your mouth and seal your lips tightly around it. 5. Breathe in slowly and as deeply as possible, raising the piston or the ball toward the top of the column. 6. Hold your breath for 3-5 seconds or for as long as possible. Allow the piston or ball to fall to the bottom of the column. 7. Remove the mouthpiece from your mouth and breathe out normally. 8. Rest for a few seconds and repeat Steps 1 through 7 at least 10 times every 1-2 hours when you are awake. Take your time  and take a few normal breaths between deep breaths. 9. The spirometer may include an indicator to show your best effort. Use the indicator as a goal to work toward during each repetition. 10. After each set of 10 deep breaths, practice coughing to be sure your lungs are clear. If you have an incision (the cut made at the time of surgery), support your incision when coughing by placing a pillow or rolled up towels firmly against it. Once you are able to get out of bed, walk around indoors and cough well. You may stop using the incentive spirometer when instructed by your caregiver.  RISKS AND COMPLICATIONS  Take your time so you do not get dizzy or light-headed.  If you are in pain, you may need to take or ask for pain medication before doing incentive spirometry. It is harder to take a deep breath if you are having pain. AFTER USE  Rest and breathe slowly and easily.  It can be helpful to keep track of a log of your progress. Your caregiver can provide you with a simple table to help with this. If you are using the spirometer at home, follow these instructions: [place] IF:   You are having difficultly using the spirometer.  You have trouble using the spirometer as often as instructed.  Your pain medication is not giving enough relief while using the spirometer.  You develop fever of 100.5 F (38.1 C) or higher. SEEK IMMEDIATE MEDICAL CARE IF:   You cough up bloody sputum that had not been present before.  You develop fever of 102 F (38.9 C) or greater.  You develop worsening pain at or near the incision site. MAKE SURE YOU:   Understand these instructions.  Will watch your condition.  Will get help right away if you are not doing well or get worse. Document Released: 08/13/2006 Document Revised: 06/25/2011 Document Reviewed: 10/14/2006 [facility] Patient Information 2014 [place],  [place].   ________________________________________________________________________

## 2018-12-05 ENCOUNTER — Other Ambulatory Visit: Payer: Self-pay

## 2018-12-05 ENCOUNTER — Encounter (HOSPITAL_COMMUNITY)
Admission: RE | Admit: 2018-12-05 | Discharge: 2018-12-05 | Disposition: A | Discharge status: Home / Self Care | Payer: Medicare Other | Source: Ambulatory Visit | Attending: Orthopedic Surgery | Admitting: Orthopedic Surgery

## 2018-12-05 ENCOUNTER — Encounter (HOSPITAL_COMMUNITY): Payer: Self-pay

## 2018-12-05 ENCOUNTER — Encounter (INDEPENDENT_AMBULATORY_CARE_PROVIDER_SITE_OTHER): Payer: Self-pay

## 2018-12-05 DIAGNOSIS — R9431 Abnormal electrocardiogram [ECG] [EKG]: Secondary | ICD-10-CM | POA: Diagnosis not present

## 2018-12-05 DIAGNOSIS — I10 Essential (primary) hypertension: Secondary | ICD-10-CM | POA: Insufficient documentation

## 2018-12-05 DIAGNOSIS — Z01818 Encounter for other preprocedural examination: Secondary | ICD-10-CM | POA: Insufficient documentation

## 2018-12-05 DIAGNOSIS — M1712 Unilateral primary osteoarthritis, left knee: Secondary | ICD-10-CM | POA: Insufficient documentation

## 2018-12-05 LAB — CBC
HCT: 40.2 % (ref 36.0–46.0)
Hemoglobin: 12.8 g/dL (ref 12.0–15.0)
MCH: 31.8 pg (ref 26.0–34.0)
MCHC: 31.8 g/dL (ref 30.0–36.0)
MCV: 99.8 fL (ref 80.0–100.0)
Platelets: 272 10*3/uL (ref 150–400)
RBC: 4.03 MIL/uL (ref 3.87–5.11)
RDW: 13 % (ref 11.5–15.5)
WBC: 6.9 10*3/uL (ref 4.0–10.5)
nRBC: 0 % (ref 0.0–0.2)

## 2018-12-05 LAB — BASIC METABOLIC PANEL
Anion gap: 11 (ref 5–15)
BUN: 12 mg/dL (ref 8–23)
CO2: 26 mmol/L (ref 22–32)
Calcium: 9.5 mg/dL (ref 8.9–10.3)
Chloride: 107 mmol/L (ref 98–111)
Creatinine, Ser: 0.61 mg/dL (ref 0.44–1.00)
GFR calc Af Amer: >60 mL/min (ref >60)
GFR calc non Af Amer: >60 mL/min (ref >60)
Glucose, Bld: 99 mg/dL (ref 70–99)
Potassium: 4.7 mmol/L (ref 3.5–5.1)
Sodium: 144 mmol/L (ref 135–145)

## 2018-12-05 LAB — TYPE AND SCREEN
ABO/RH(D): A NEG
Antibody Screen: NEGATIVE

## 2018-12-08 ENCOUNTER — Other Ambulatory Visit: Payer: Self-pay | Admitting: Internal Medicine

## 2018-12-08 DIAGNOSIS — Z1231 Encounter for screening mammogram for malignant neoplasm of breast: Secondary | ICD-10-CM

## 2018-12-08 DIAGNOSIS — M179 Osteoarthritis of knee, unspecified: Secondary | ICD-10-CM | POA: Diagnosis not present

## 2018-12-08 DIAGNOSIS — I129 Hypertensive chronic kidney disease with stage 1 through stage 4 chronic kidney disease, or unspecified chronic kidney disease: Secondary | ICD-10-CM | POA: Diagnosis not present

## 2018-12-08 DIAGNOSIS — N182 Chronic kidney disease, stage 2 (mild): Secondary | ICD-10-CM | POA: Diagnosis not present

## 2018-12-10 ENCOUNTER — Ambulatory Visit (HOSPITAL_COMMUNITY): Payer: Medicare Other | Admitting: Anesthesiology

## 2018-12-10 ENCOUNTER — Ambulatory Visit (HOSPITAL_COMMUNITY): Payer: Medicare Other | Admitting: Physician Assistant

## 2018-12-10 NOTE — Anesthesia Preprocedure Evaluation (Addendum)
Anesthesia Evaluation    Airway        Dental   Pulmonary former smoker,  12/12/2018 SARS coronavirus NEG          Cardiovascular hypertension, Pt. on medications      Neuro/Psych Depression    GI/Hepatic   Endo/Other    Renal/GU      Musculoskeletal   Abdominal   Peds  Hematology   Anesthesia Other Findings   Reproductive/Obstetrics                            Anesthesia Physical Anesthesia Plan  ASA: II  Anesthesia Plan: Spinal   Post-op Pain Management:  [facility] for Post-op pain   Induction:   PONV Risk Score and Plan: 2 and Ondansetron, Dexamethasone and Treatment may vary due to age or medical condition  Airway Management Planned:   Additional Equipment:   Intra-op Plan:   Post-operative Plan:   Informed Consent:   Plan Discussed with:   Anesthesia Plan Comments: (Per pt history, complications with spinal 10-15 years ago.  No complications noted with spinal for right total knee 01/17/2015.  Konrad Felix, PA-C)       Anesthesia Quick Evaluation

## 2018-12-12 ENCOUNTER — Other Ambulatory Visit (HOSPITAL_COMMUNITY)
Admission: RE | Admit: 2018-12-12 | Discharge: 2018-12-12 | Disposition: A | Discharge status: Home / Self Care | Payer: Medicare Other | Source: Ambulatory Visit | Attending: Orthopedic Surgery | Admitting: Orthopedic Surgery

## 2018-12-12 DIAGNOSIS — Z20828 Contact with and (suspected) exposure to other viral communicable diseases: Secondary | ICD-10-CM | POA: Insufficient documentation

## 2018-12-12 DIAGNOSIS — Z01812 Encounter for preprocedural laboratory examination: Secondary | ICD-10-CM | POA: Diagnosis not present

## 2018-12-12 LAB — SARS CORONAVIRUS 2 (TAT 6-24 HRS): SARS Coronavirus 2: NEGATIVE

## 2018-12-16 ENCOUNTER — Encounter (HOSPITAL_COMMUNITY): Payer: Self-pay | Admitting: Anesthesiology

## 2018-12-16 ENCOUNTER — Encounter: Payer: Self-pay | Admitting: Cardiology

## 2018-12-16 ENCOUNTER — Ambulatory Visit (INDEPENDENT_AMBULATORY_CARE_PROVIDER_SITE_OTHER): Payer: Medicare Other | Admitting: Cardiology

## 2018-12-16 ENCOUNTER — Encounter (HOSPITAL_COMMUNITY)
Admission: RE | Disposition: A | Discharge status: Home / Self Care | Payer: Self-pay | Source: Other Acute Inpatient Hospital | Attending: Orthopedic Surgery

## 2018-12-16 ENCOUNTER — Ambulatory Visit (HOSPITAL_COMMUNITY)
Admission: RE | Admit: 2018-12-16 | Discharge: 2018-12-16 | Disposition: A | Discharge status: Home / Self Care | Payer: Medicare Other | Source: Other Acute Inpatient Hospital | Attending: Orthopedic Surgery | Admitting: Orthopedic Surgery

## 2018-12-16 ENCOUNTER — Other Ambulatory Visit: Payer: Self-pay

## 2018-12-16 VITALS — BP 174/74 | HR 74 | Temp 97.3°F | Ht 64.0 in | Wt 159.0 lb

## 2018-12-16 DIAGNOSIS — M170 Bilateral primary osteoarthritis of knee: Secondary | ICD-10-CM | POA: Diagnosis not present

## 2018-12-16 DIAGNOSIS — Z538 Procedure and treatment not carried out for other reasons: Secondary | ICD-10-CM | POA: Diagnosis not present

## 2018-12-16 DIAGNOSIS — Z96651 Presence of right artificial knee joint: Secondary | ICD-10-CM | POA: Insufficient documentation

## 2018-12-16 DIAGNOSIS — I4891 Unspecified atrial fibrillation: Secondary | ICD-10-CM | POA: Insufficient documentation

## 2018-12-16 DIAGNOSIS — Z885 Allergy status to narcotic agent status: Secondary | ICD-10-CM | POA: Insufficient documentation

## 2018-12-16 DIAGNOSIS — Z87891 Personal history of nicotine dependence: Secondary | ICD-10-CM | POA: Diagnosis not present

## 2018-12-16 DIAGNOSIS — Z79899 Other long term (current) drug therapy: Secondary | ICD-10-CM | POA: Insufficient documentation

## 2018-12-16 DIAGNOSIS — R011 Cardiac murmur, unspecified: Secondary | ICD-10-CM | POA: Diagnosis not present

## 2018-12-16 DIAGNOSIS — I10 Essential (primary) hypertension: Secondary | ICD-10-CM | POA: Diagnosis not present

## 2018-12-16 DIAGNOSIS — I480 Paroxysmal atrial fibrillation: Secondary | ICD-10-CM | POA: Insufficient documentation

## 2018-12-16 DIAGNOSIS — Z01810 Encounter for preprocedural cardiovascular examination: Secondary | ICD-10-CM | POA: Insufficient documentation

## 2018-12-16 DIAGNOSIS — E669 Obesity, unspecified: Secondary | ICD-10-CM | POA: Insufficient documentation

## 2018-12-16 DIAGNOSIS — M1611 Unilateral primary osteoarthritis, right hip: Secondary | ICD-10-CM | POA: Diagnosis not present

## 2018-12-16 DIAGNOSIS — I110 Hypertensive heart disease with heart failure: Secondary | ICD-10-CM | POA: Diagnosis not present

## 2018-12-16 DIAGNOSIS — M25762 Osteophyte, left knee: Secondary | ICD-10-CM | POA: Insufficient documentation

## 2018-12-16 SURGERY — ARTHROPLASTY, KNEE, TOTAL
Anesthesia: Spinal | Laterality: Left

## 2018-12-16 MED ORDER — MIDAZOLAM HCL 2 MG/2ML IJ SOLN
INTRAMUSCULAR | Status: AC
  Filled 2018-12-16: qty 2

## 2018-12-16 MED ORDER — TRANEXAMIC ACID-NACL 1000-0.7 MG/100ML-% IV SOLN
1000.0000 mg | INTRAVENOUS | Status: DC
  Filled 2018-12-16: qty 100

## 2018-12-16 MED ORDER — BUPIVACAINE-EPINEPHRINE (PF) 0.5% -1:200000 IJ SOLN
INTRAMUSCULAR | Status: AC
  Filled 2018-12-16: qty 30

## 2018-12-16 MED ORDER — ONDANSETRON HCL 4 MG/2ML IJ SOLN
INTRAMUSCULAR | Status: AC
  Filled 2018-12-16: qty 2

## 2018-12-16 MED ORDER — DEXAMETHASONE SODIUM PHOSPHATE 10 MG/ML IJ SOLN
INTRAMUSCULAR | Status: AC
  Filled 2018-12-16: qty 1

## 2018-12-16 MED ORDER — FENTANYL CITRATE (PF) 100 MCG/2ML IJ SOLN
INTRAMUSCULAR | Status: AC
  Filled 2018-12-16: qty 2

## 2018-12-16 MED ORDER — APIXABAN 5 MG PO TABS: 5.0000 mg | ORAL_TABLET | Freq: Two times a day (BID) | ORAL | 3 refills | Status: DC

## 2018-12-16 MED ORDER — LISINOPRIL 20 MG PO TABS: 10.0000 mg | ORAL_TABLET | Freq: Every day | ORAL | 2 refills | Status: DC

## 2018-12-16 MED ORDER — DEXAMETHASONE SODIUM PHOSPHATE 10 MG/ML IJ SOLN: 10.0000 mg | Freq: Once | INTRAMUSCULAR | Status: DC

## 2018-12-16 MED ORDER — KETOROLAC TROMETHAMINE 30 MG/ML IJ SOLN
INTRAMUSCULAR | Status: AC
  Filled 2018-12-16: qty 1

## 2018-12-16 MED ORDER — SODIUM CHLORIDE (PF) 0.9 % IJ SOLN
INTRAMUSCULAR | Status: AC
  Filled 2018-12-16: qty 50

## 2018-12-16 MED ORDER — POVIDONE-IODINE 10 % EX SWAB
2.0000 "application " | Freq: Once | CUTANEOUS | Status: AC
  Administered 2018-12-16: 2 via TOPICAL

## 2018-12-16 MED ORDER — LACTATED RINGERS IV SOLN
INTRAVENOUS | Status: DC
  Administered 2018-12-16: 06:00:00 via INTRAVENOUS

## 2018-12-16 MED ORDER — CEFAZOLIN SODIUM-DEXTROSE 2-4 GM/100ML-% IV SOLN
2.0000 g | INTRAVENOUS | Status: DC
  Filled 2018-12-16: qty 100

## 2018-12-16 MED ORDER — PROPOFOL 10 MG/ML IV BOLUS
INTRAVENOUS | Status: AC
  Filled 2018-12-16: qty 60

## 2018-12-16 MED ORDER — LIDOCAINE 2% (20 MG/ML) 5 ML SYRINGE
INTRAMUSCULAR | Status: AC
  Filled 2018-12-16: qty 5

## 2018-12-16 MED ORDER — CHLORHEXIDINE GLUCONATE 4 % EX LIQD: 60.0000 mL | Freq: Once | CUTANEOUS | Status: DC

## 2018-12-16 NOTE — Interval H&P Note (Signed)
History and Physical Interval Note:  12/16/2018 6:58 AM  Sheena Taylor  has presented today for surgery, with the diagnosis of Left knee osteoarthritis.  The various methods of treatment have been discussed with the patient and family. After consideration of risks, benefits and other options for treatment, the patient has consented to  Procedure(s) with comments: TOTAL KNEE ARTHROPLASTY (Left) - 70 mins as a surgical intervention.  The patient's history has been reviewed, patient examined, no change in status, stable for surgery.  I have reviewed the patient's chart and labs.  Questions were answered to the patient's satisfaction.     Mauri Pole

## 2018-12-16 NOTE — Progress Notes (Signed)
Patient referred by [name], MD for preop risk stratification  Subjective:   Sheena Taylor, female    DOB: 24-Apr-1934, 84 y.o.   MRN: [id number]   Chief Complaint  Patient presents with  . Pre-op Exam  . Atrial Fibrillation    HPI  84 y.o. Caucasian female with hypertension, arthritis, referred for management of atrial fibrillation.  Patient was supposed to undergo left knee arthroplasty today. However, she was noted to be in Afib with RVR. Patient denies feeling any palpitations, chest pain, or shortness of breath.  At office visit today, she was in sinus rhythm. Patient was recently started on lisinopril 10 mg by Dr. Virgina Jock for hypertension.   Past Medical History:  Diagnosis Date  . Arthritis   . Complication of anesthesia    Dr. Winfred Leeds had problems putting spinal in approximately 10-15 years ago and had to have general   . Depression   . H/O vein stripping    bilateral  . 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 History of laparotomy    for ectopic pregnancy  . Hypertension   . Macular degeneration   . S/P bunionectomy    bilateral and hammer toe-correction     Past Surgical History:  Procedure Laterality Date  . ABDOMINAL HYSTERECTOMY     partial  . BREAST EXCISIONAL BIOPSY Left   . BREAST EXCISIONAL BIOPSY Right   . BUNIONECTOMY     bilateral with hammer toe and amputation 2nd right toe with this  . LAPAROTOMY     for ruptured ectopic pregnancy  . TOTAL KNEE ARTHROPLASTY Right 01/17/2015   Procedure: TOTAL RIGHT KNEE ARTHROPLASTY;  Surgeon: Paralee Cancel, MD;  Location: [facility]S;  Service: Orthopedics;  Laterality: Right;  Marland Kitchen VARICOSE VEIN SURGERY     bilateral     Social History   Socioeconomic History  . Marital status: Married    Spouse name: Not on file  . Number of children: Not on file  . Years of education: Not on file  . Highest education level: Not on file  Occupational History  . Not on file  Social  Needs  . Financial resource strain: Not on file  . Food insecurity    Worry: Not on file    Inability: Not on file  . Transportation needs    Medical: Not on file    Non-medical: Not on file  Tobacco Use  . Smoking status: Former Smoker    Types: Cigarettes  . Smokeless tobacco: Never Used  . Tobacco comment: smokes about once a week when having a mixed drink   Substance and Sexual Activity  . Alcohol use: Yes    Comment: occassionally  . Drug use: No  . Sexual activity: Not on file  Lifestyle  . Physical activity    Days per week: Not on file    Minutes per session: Not on file  . Stress: Not on file  Relationships  . Social Herbalist on phone: Not on file    Gets together: Not on file    Attends religious service: Not on file    Active member of club or organization: Not on file    Attends meetings of clubs or organizations: Not on file    Relationship status: Not on file  . Intimate partner violence    Fear of current or ex partner: Not on file    Emotionally abused:  Not on file    Physically abused: Not on file    Forced sexual activity: Not on file  Other Topics Concern  . Not on file  Social History Narrative  . Not on file     Family History  Problem Relation Age of Onset  . Breast cancer Neg Hx      Current Outpatient Medications on File Prior to Visit  Medication Sig Dispense Refill  . hydrochlorothiazide (HYDRODIURIL) 25 MG tablet Take 25 mg by mouth daily.    . Lutein 6 MG TABS Take 6 mg by mouth 2 (two) times daily.     . Multiple Vitamins-Minerals (PRESERVISION AREDS PO) Take 1 tablet by mouth 2 (two) times daily.    . pravastatin (PRAVACHOL) 20 MG tablet Take 20 mg by mouth daily.    . traMADol (ULTRAM) 50 MG tablet Take 50 mg by mouth every 6 (six) hours as needed for moderate pain.    . vitamin C (ASCORBIC ACID) 500 MG tablet Take 500 mg by mouth daily after breakfast.     No current facility-administered medications on file prior  to visit.     Cardiovascular studies:  EKG 12/16/2018: Sinus rhythm 70 bpm. Borderline left atrial enlargement. Compared to an earlier EKG from today, afib now longer present now.  EKG 12/16/2018: Afib w/RVR 137 bpm.  Nonspecific ST-T changes  Recent labs: Results for MALLORIE, LILLIBRIDGE (MRN [id number]) as of 12/16/2018 14:40  Ref. Range 12/05/2018 10:05  Sodium Latest Ref Range: 135 - 145 mmol/L 144  Potassium Latest Ref Range: 3.5 - 5.1 mmol/L 4.7  Chloride Latest Ref Range: 98 - 111 mmol/L 107  CO2 Latest Ref Range: 22 - 32 mmol/L 26  Glucose Latest Ref Range: 70 - 99 mg/dL 99  BUN Latest Ref Range: 8 - 23 mg/dL 12  Creatinine Latest Ref Range: 0.44 - 1.00 mg/dL 0.61  Calcium Latest Ref Range: 8.9 - 10.3 mg/dL 9.5  Anion gap Latest Ref Range: 5 - 15  11  GFR, Est Non African American Latest Ref Range: >60 mL/min >60  GFR, Est African American Latest Ref Range: >60 mL/min >60   Results for SIMISOLA, CHRISP (MRN [id number]) as of 12/16/2018 14:40  Ref. Range 12/05/2018 10:05  WBC Latest Ref Range: 4.0 - 10.5 K/uL 6.9  RBC Latest Ref Range: 3.87 - 5.11 MIL/uL 4.03  Hemoglobin Latest Ref Range: 12.0 - 15.0 g/dL 12.8  HCT Latest Ref Range: 36.0 - 46.0 % 40.2  MCV Latest Ref Range: 80.0 - 100.0 fL 99.8  MCH Latest Ref Range: 26.0 - 34.0 pg 31.8  MCHC Latest Ref Range: 30.0 - 36.0 g/dL 31.8  RDW Latest Ref Range: 11.5 - 15.5 % 13.0  Platelets Latest Ref Range: 150 - 400 K/uL 272  nRBC Latest Ref Range: 0.0 - 0.2 % 0.0    Review of Systems  Constitution: Negative for decreased appetite, malaise/fatigue, weight gain and weight loss.  HENT: Negative for congestion.   Eyes: Negative for visual disturbance.  Cardiovascular: Negative for chest pain, dyspnea on exertion, leg swelling, palpitations and syncope.  Respiratory: Negative for cough.   Endocrine: Negative for cold intolerance.  Hematologic/Lymphatic: Does not bruise/bleed easily.  Skin: Negative for itching and rash.   Musculoskeletal: Positive for joint pain. Negative for myalgias.  Gastrointestinal: Negative for abdominal pain, nausea and vomiting.  Genitourinary: Negative for dysuria.  Neurological: Negative for dizziness and weakness.  Psychiatric/Behavioral: The patient is not nervous/anxious.   All other systems reviewed and are  negative.        Vitals:   12/16/18 1349  BP: (!) 167/76  Pulse: 77  Temp: (!) 97.3 F (36.3 C)  SpO2: 97%     Body mass index is 27.29 kg/m. Filed Weights   12/16/18 1349  Weight: 159 lb (72.1 kg)     Objective:   Physical Exam  Constitutional: She is oriented to person, place, and time. She appears well-developed and well-nourished. No distress.  HENT:  Head: Normocephalic and atraumatic.  Eyes: Pupils are equal, round, and reactive to light. Conjunctivae are normal.  Neck: No JVD present.  Cardiovascular: Normal rate, regular rhythm and intact distal pulses.  Murmur heard.  Harsh midsystolic murmur is present with a grade of 2/6 at the upper right sternal border radiating to the neck. Varicosities both feet  Pulmonary/Chest: Effort normal and breath sounds normal. She has no wheezes. She has no rales.  Abdominal: Soft. Bowel sounds are normal. There is no rebound.  Musculoskeletal:        General: No edema.  Lymphadenopathy:    She has no cervical adenopathy.  Neurological: She is alert and oriented to person, place, and time. No cranial nerve deficit.  Skin: Skin is warm and dry.  Psychiatric: She has a normal mood and affect.  Nursing note and vitals reviewed.         Assessment & Recommendations:   84 y.o. Caucasian female with hypertension, arthritis, referred for management of atrial fibrillation.  Atrial fibrillation: Paroxysmal. Patient seems to have self converted to sinus rhythm. CHA2DS2VASc score 4, annual stroke risk 5%. Recommend starting eliquis 5 mg bid.  Given that she is completely asymptomatic with her Afib, it is  possible that patient may have even had Afib prior to today, but not known about it. She will need lifelong anticoagulation, but will likely need interruption for her knee arthroplasty.That she has self converted to sinus rhythm today, stroke risk is elevated in immediate post conversion period. Ideally, she should be on uninterrupted anticoagulation for 4-6 weeks. If feasible, recommend delaying knee arthroplasty till October 2020, unless it can be performed on eliquis.  Clinically, suspicion for ischemia is low. Risk factors for her age include age, gender, and uncontrolled hypertension.  Will obtain echocardiogram.  Hypertension: Uncontrolled. Increase lisinopril to 20 mg daily.   F/u in two weeks to discuss echocardiogram and reassess blood pressure and optimize this prior to knee arthroplasty.    Thank you for referring the patient to [place]. Please feel free to contact with any questions.  Nigel Mormon, MD [facility] Pager: [phone] Office: [phone] If no answer Cell [phone]

## 2018-12-17 ENCOUNTER — Ambulatory Visit (HOSPITAL_COMMUNITY)
Admission: RE | Admit: 2018-12-17 | Discharge: 2018-12-17 | Disposition: A | Discharge status: Home / Self Care | Payer: Medicare Other | Source: Ambulatory Visit | Attending: Cardiology | Admitting: Cardiology

## 2018-12-17 DIAGNOSIS — I340 Nonrheumatic mitral (valve) insufficiency: Secondary | ICD-10-CM | POA: Diagnosis not present

## 2018-12-17 DIAGNOSIS — I10 Essential (primary) hypertension: Secondary | ICD-10-CM | POA: Diagnosis not present

## 2018-12-17 DIAGNOSIS — F17200 Nicotine dependence, unspecified, uncomplicated: Secondary | ICD-10-CM | POA: Insufficient documentation

## 2018-12-17 DIAGNOSIS — I480 Paroxysmal atrial fibrillation: Secondary | ICD-10-CM | POA: Insufficient documentation

## 2018-12-17 DIAGNOSIS — Z7901 Long term (current) use of anticoagulants: Secondary | ICD-10-CM | POA: Insufficient documentation

## 2018-12-17 NOTE — Progress Notes (Signed)
Lisinopril 20 mg 1 tab daily. Please check re: patient assistance for Eliquis. We can give samples at next visit, till then.

## 2018-12-17 NOTE — Progress Notes (Signed)
  Echocardiogram 2D Echocardiogram has been performed.  Sheena Taylor 12/17/2018, 9:47 AM

## 2018-12-17 NOTE — Progress Notes (Signed)
Pt aware of results and pending appt. Please clarify lisinopril dosing. You send in 20mg  1/2 tab daily????? Also Eliquis has a $460 deductible. 30 day is $43 and 90 day is $129 through pt insurance [employer].//ah

## 2018-12-19 ENCOUNTER — Other Ambulatory Visit: Payer: Self-pay

## 2018-12-19 DIAGNOSIS — I480 Paroxysmal atrial fibrillation: Secondary | ICD-10-CM

## 2018-12-19 DIAGNOSIS — I10 Essential (primary) hypertension: Secondary | ICD-10-CM

## 2018-12-19 MED ORDER — APIXABAN 5 MG PO TABS: 5.0000 mg | ORAL_TABLET | Freq: Two times a day (BID) | ORAL | 2 refills | Status: DC

## 2018-12-19 MED ORDER — LISINOPRIL 20 MG PO TABS: 20.0000 mg | ORAL_TABLET | Freq: Every day | ORAL | 2 refills | Status: DC

## 2019-01-01 ENCOUNTER — Other Ambulatory Visit: Payer: Self-pay

## 2019-01-01 ENCOUNTER — Encounter: Payer: Self-pay | Admitting: Cardiology

## 2019-01-01 ENCOUNTER — Ambulatory Visit (INDEPENDENT_AMBULATORY_CARE_PROVIDER_SITE_OTHER): Payer: Medicare Other | Admitting: Cardiology

## 2019-01-01 VITALS — BP 148/54 | HR 54 | Temp 97.5°F | Ht 64.0 in | Wt 158.1 lb

## 2019-01-01 DIAGNOSIS — I10 Essential (primary) hypertension: Secondary | ICD-10-CM | POA: Diagnosis not present

## 2019-01-01 DIAGNOSIS — Z01810 Encounter for preprocedural cardiovascular examination: Secondary | ICD-10-CM

## 2019-01-01 DIAGNOSIS — I480 Paroxysmal atrial fibrillation: Secondary | ICD-10-CM | POA: Diagnosis not present

## 2019-01-01 NOTE — Progress Notes (Signed)
Patient referred by [name], MD for preop risk stratification  Subjective:   Sheena Taylor, female    DOB: June 27, 1934, 84 y.o.   MRN: [id number]   Chief Complaint  Patient presents with  . Atrial Fibrillation    HPI  84 y.o. Caucasian female with hypertension, arthritis, paroxysmal atrial fibrillation, upcoming knee arthroplasty.  Echocardiogram showed normal EF, grade 1 DD, mild to moderate MR, mild TR. Blood pressure is better controlled. Patient denies chest pain, shortness of breath, palpitations, leg edema, orthopnea, PND, TIA/syncope. Her biggest compliant today is her knee pain. She stays in sinus rhythm. BP is better controlled.      Past Medical History:  Diagnosis Date  . Arthritis   . Atrial fibrillation ([place])   . Complication of anesthesia    Dr. Winfred Leeds had problems putting spinal in approximately 10-15 years ago and had to have general   . Depression   . H/O vein stripping    bilateral  . 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 History of laparotomy    for ectopic pregnancy  . Hypertension   . Macular degeneration   . S/P bunionectomy    bilateral and hammer toe-correction     Past Surgical History:  Procedure Laterality Date  . ABDOMINAL HYSTERECTOMY     partial  . BREAST EXCISIONAL BIOPSY Left   . BREAST EXCISIONAL BIOPSY Right   . BUNIONECTOMY     bilateral with hammer toe and amputation 2nd right toe with this  . LAPAROTOMY     for ruptured ectopic pregnancy  . TOTAL KNEE ARTHROPLASTY Right 01/17/2015   Procedure: TOTAL RIGHT KNEE ARTHROPLASTY;  Surgeon: Paralee Cancel, MD;  Location: [facility];  Service: Orthopedics;  Laterality: Right;  Marland Kitchen VARICOSE VEIN SURGERY     bilateral     Social History   Socioeconomic History  . Marital status: Married    Spouse name: Not on file  . Number of children: 2  . Years of education: Not on file  . Highest education level: Not on file  Occupational History  .  Not on file  Social Needs  . Financial resource strain: Not on file  . Food insecurity    Worry: Not on file    Inability: Not on file  . Transportation needs    Medical: Not on file    Non-medical: Not on file  Tobacco Use  . Smoking status: Light Tobacco Smoker    Types: Cigarettes  . Smokeless tobacco: Never Used  . Tobacco comment: smokes about once a week when having a mixed drink   Substance and Sexual Activity  . Alcohol use: Yes    Comment: occassionally  . Drug use: No  . Sexual activity: Not on file  Lifestyle  . Physical activity    Days per week: Not on file    Minutes per session: Not on file  . Stress: Not on file  Relationships  . Social Herbalist on phone: Not on file    Gets together: Not on file    Attends religious service: Not on file    Active member of club or organization: Not on file    Attends meetings of clubs or organizations: Not on file    Relationship status: Not on file  . Intimate partner violence    Fear of current or ex partner: Not on file    Emotionally abused: Not  on file    Physically abused: Not on file    Forced sexual activity: Not on file  Other Topics Concern  . Not on file  Social History Narrative  . Not on file     Family History  Problem Relation Age of Onset  . Heart disease Mother   . Breast cancer Neg Hx      Current Outpatient Medications on File Prior to Visit  Medication Sig Dispense Refill  . apixaban (ELIQUIS) 5 MG TABS tablet Take 1 tablet (5 mg total) by mouth 2 (two) times daily. 180 tablet 2  . hydrochlorothiazide (HYDRODIURIL) 25 MG tablet Take 25 mg by mouth daily.    Marland Kitchen lisinopril (ZESTRIL) 20 MG tablet Take 1 tablet (20 mg total) by mouth daily. 90 tablet 2  . Lutein 6 MG TABS Take 6 mg by mouth 2 (two) times daily.     . Multiple Vitamins-Minerals (PRESERVISION AREDS PO) Take 1 tablet by mouth 2 (two) times daily.    . pravastatin (PRAVACHOL) 20 MG tablet Take 20 mg by mouth daily.     . traMADol (ULTRAM) 50 MG tablet Take 50 mg by mouth every 6 (six) hours as needed for moderate pain.    . vitamin C (ASCORBIC ACID) 500 MG tablet Take 500 mg by mouth daily after breakfast.     No current facility-administered medications on file prior to visit.     Cardiovascular studies:  EKG 01/01/2019: Sinus rhythm 70 bpm Frequent PAC's  Left atrial enlargement  Echocardiogram 12/17/2018:  1. The left ventricle has hyperdynamic systolic function, with an ejection fraction of >65%. The cavity size was normal. Left ventricular diastolic Doppler parameters are consistent with impaired relaxation. No evidence of left ventricular regional wall  motion abnormalities.  2. The right ventricle has normal systolic function. The cavity was normal. There is no increase in right ventricular wall thickness.  3. Mild to moderate mitral regurgitation.  4. Mild tricuspid regurgitation. PASP 23 mmhg.  EKG 12/16/2018: Sinus rhythm 70 bpm. Borderline left atrial enlargement. Compared to an earlier EKG from today, afib now longer present now.  EKG 12/16/2018: Afib w/RVR 137 bpm.  Nonspecific ST-T changes  Recent labs: Results for ZAMARIA, RUMMELL (MRN [id number]) as of 12/16/2018 14:40  Ref. Range 12/05/2018 10:05  Sodium Latest Ref Range: 135 - 145 mmol/L 144  Potassium Latest Ref Range: 3.5 - 5.1 mmol/L 4.7  Chloride Latest Ref Range: 98 - 111 mmol/L 107  CO2 Latest Ref Range: 22 - 32 mmol/L 26  Glucose Latest Ref Range: 70 - 99 mg/dL 99  BUN Latest Ref Range: 8 - 23 mg/dL 12  Creatinine Latest Ref Range: 0.44 - 1.00 mg/dL 0.61  Calcium Latest Ref Range: 8.9 - 10.3 mg/dL 9.5  Anion gap Latest Ref Range: 5 - 15  11  GFR, Est Non African American Latest Ref Range: >60 mL/min >60  GFR, Est African American Latest Ref Range: >60 mL/min >60   Results for TAMARRA, MALLETT (MRN [id number]) as of 12/16/2018 14:40  Ref. Range 12/05/2018 10:05  WBC Latest Ref Range: 4.0 - 10.5 K/uL 6.9  RBC  Latest Ref Range: 3.87 - 5.11 MIL/uL 4.03  Hemoglobin Latest Ref Range: 12.0 - 15.0 g/dL 12.8  HCT Latest Ref Range: 36.0 - 46.0 % 40.2  MCV Latest Ref Range: 80.0 - 100.0 fL 99.8  MCH Latest Ref Range: 26.0 - 34.0 pg 31.8  MCHC Latest Ref Range: 30.0 - 36.0 g/dL 31.8  RDW Latest Ref  Range: 11.5 - 15.5 % 13.0  Platelets Latest Ref Range: 150 - 400 K/uL 272  nRBC Latest Ref Range: 0.0 - 0.2 % 0.0    Review of Systems  Constitution: Negative for decreased appetite, malaise/fatigue, weight gain and weight loss.  HENT: Negative for congestion.   Eyes: Negative for visual disturbance.  Cardiovascular: Negative for chest pain, dyspnea on exertion, leg swelling, palpitations and syncope.  Respiratory: Negative for cough.   Endocrine: Negative for cold intolerance.  Hematologic/Lymphatic: Does not bruise/bleed easily.  Skin: Negative for itching and rash.  Musculoskeletal: Positive for joint pain. Negative for myalgias.  Gastrointestinal: Negative for abdominal pain, nausea and vomiting.  Genitourinary: Negative for dysuria.  Neurological: Negative for dizziness and weakness.  Psychiatric/Behavioral: The patient is not nervous/anxious.   All other systems reviewed and are negative.        Vitals:   01/01/19 1357  BP: (!) 148/54  Pulse: (!) 54  Temp: (!) 97.5 F (36.4 C)  SpO2: 95%     Body mass index is 27.14 kg/m. Filed Weights   01/01/19 1357  Weight: 158 lb 1.6 oz (71.7 kg)     Objective:   Physical Exam  Constitutional: She is oriented to person, place, and time. She appears well-developed and well-nourished. No distress.  HENT:  Head: Normocephalic and atraumatic.  Eyes: Pupils are equal, round, and reactive to light. Conjunctivae are normal.  Neck: No JVD present.  Cardiovascular: Normal rate, regular rhythm and intact distal pulses.  Murmur heard.  Harsh midsystolic murmur is present with a grade of 2/6 at the upper right sternal border radiating to the neck.  Varicosities both feet  Pulmonary/Chest: Effort normal and breath sounds normal. She has no wheezes. She has no rales.  Abdominal: Soft. Bowel sounds are normal. There is no rebound.  Musculoskeletal:        General: No edema.  Lymphadenopathy:    She has no cervical adenopathy.  Neurological: She is alert and oriented to person, place, and time. No cranial nerve deficit.  Skin: Skin is warm and dry.  Psychiatric: She has a normal mood and affect.  Nursing note and vitals reviewed.         Assessment & Recommendations:   84 y.o. Caucasian female with hypertension, arthritis, referred for management of atrial fibrillation.  Atrial fibrillation: Paroxysmal. In sinus rhythm today. CHA2DS2VASc score 4, annual stroke risk 5%. Continue starting eliquis 5 mg bid.  Low suspicion for ischemia. While ischemia workup can be performed at some point, I do not think it is necessary prior to low cardiac risk surgery of knee arthroplasty. I do not think this will mitigate her perioperative risk, which is low.  If surgery can be performed without interrupting eliquis, it would be preferable. If eliquis must be stopped, recommend doing so 2 days before the surgery, and resume soon after the surgery.   Hypertension: Better controled.  F/u in 3 months  [place], MD [facility] Pager: [phone] Office: [phone] If no answer Cell [phone]

## 2019-01-15 DIAGNOSIS — Z23 Encounter for immunization: Secondary | ICD-10-CM | POA: Diagnosis not present

## 2019-01-15 DIAGNOSIS — I10 Essential (primary) hypertension: Secondary | ICD-10-CM | POA: Diagnosis not present

## 2019-01-15 DIAGNOSIS — R82998 Other abnormal findings in urine: Secondary | ICD-10-CM | POA: Diagnosis not present

## 2019-01-15 DIAGNOSIS — R739 Hyperglycemia, unspecified: Secondary | ICD-10-CM | POA: Diagnosis not present

## 2019-01-15 DIAGNOSIS — R946 Abnormal results of thyroid function studies: Secondary | ICD-10-CM | POA: Diagnosis not present

## 2019-01-15 DIAGNOSIS — E7849 Other hyperlipidemia: Secondary | ICD-10-CM | POA: Diagnosis not present

## 2019-01-21 ENCOUNTER — Encounter (HOSPITAL_COMMUNITY): Payer: Self-pay

## 2019-01-21 NOTE — Patient Instructions (Signed)
DUE TO COVID-19 ONLY ONE VISITOR IS ALLOWED TO COME WITH YOU AND STAY IN THE WAITING ROOM ONLY DURING PRE OP AND PROCEDURE DAY OF SURGERY. THE 1 VISITOR MAY VISIT WITH YOU AFTER SURGERY IN YOUR PRIVATE ROOM DURING VISITING HOURS ONLY!  YOU NEED TO HAVE A COVID 19 TEST ON_______ @_______ , THIS TEST MUST BE DONE BEFORE SURGERY, COME  Sheena Taylor , [id number].  ([place]) ONCE YOUR COVID TEST IS COMPLETED, PLEASE BEGIN THE QUARANTINE INSTRUCTIONS AS OUTLINED IN YOUR HANDOUT.                Sheena Taylor  01/21/2019   Your procedure is scheduled on: 01-27-19   Report to [facility] Main  Entrance   Report to admitting at       0730  AM     Call this number if you have problems the morning of surgery [phone]    Remember: NO SOLID FOOD AFTER MIDNIGHT THE NIGHT PRIOR TO SURGERY. NOTHING BY MOUTH EXCEPT CLEAR LIQUIDS UNTIL   0700 am . PLEASE FINISH ENSURE DRINK PER SURGEON ORDER  WHICH NEEDS TO BE COMPLETED AT 0700 am then nothing by mouth.    CLEAR LIQUID DIET   Foods Allowed                                                                     Foods Excluded  Coffee and tea, regular and decaf                             liquids that you cannot  Plain Jell-O any favor except red or purple                                           see through such as: Fruit ices (not with fruit pulp)                                     milk, soups, orange juice  Iced Popsicles                                    All solid food Carbonated beverages, regular and diet                                    Cranberry, grape and apple juices Sports drinks like Gatorade Lightly seasoned clear broth or consume(fat free) Sugar, honey syrup _____________________________________________________________________    BRUSH YOUR TEETH MORNING OF SURGERY AND RINSE YOUR MOUTH OUT, NO CHEWING GUM CANDY OR MINTS.     Take these medicines the morning of surgery with A SIP  OF WATER: prevastatin                               You may not have  any metal on your body including hair pins and              piercings  Do not wear jewelry, make-up, lotions, powders or perfumes, deodorant             Do not wear nail polish on your fingernails.  Do not shave  48 hours prior to surgery.     Do not bring valuables to the hospital. [place].  Contacts, dentures or bridgework may not be worn into surgery.              Please read over the following fact sheets you were given: _____________________________________________________________________          [facility] - Preparing for Surgery Before surgery, you can play an important role.  Because skin is not sterile, your skin needs to be as free of germs as possible.  You can reduce the number of germs on your skin by washing with CHG (chlorahexidine gluconate) soap before surgery.  CHG is an antiseptic cleaner which kills germs and bonds with the skin to continue killing germs even after washing. Please DO NOT use if you have an allergy to CHG or antibacterial soaps.  If your skin becomes reddened/irritated stop using the CHG and inform your nurse when you arrive at Short Stay. Do not shave (including legs and underarms) for at least 48 hours prior to the first CHG shower.  You may shave your face/neck. Please follow these instructions carefully:  1.  Shower with CHG Soap the night before surgery and the  morning of Surgery.  2.  If you choose to wash your hair, wash your hair first as usual with your  normal  shampoo.  3.  After you shampoo, rinse your hair and body thoroughly to remove the  shampoo.                           4.  Use CHG as you would any other liquid soap.  You can apply chg directly  to the skin and wash                       Gently with a scrungie or clean washcloth.  5.  Apply the CHG Soap to your body ONLY FROM THE NECK DOWN.   Do not use on face/ open                            Wound or open sores. Avoid contact with eyes, ears mouth and genitals (private parts).                       Wash face,  Genitals (private parts) with your normal soap.             6.  Wash thoroughly, paying special attention to the area where your surgery  will be performed.  7.  Thoroughly rinse your body with warm water from the neck down.  8.  DO NOT shower/wash with your normal soap after using and rinsing off  the CHG Soap.                9.  Pat yourself dry with a clean towel.  10.  Wear clean pajamas.            11.  Place clean sheets on your bed the night of your first shower and do not  sleep with pets. Day of Surgery : Do not apply any lotions/deodorants the morning of surgery.  Please wear clean clothes to the hospital/surgery center.  FAILURE TO FOLLOW THESE INSTRUCTIONS MAY RESULT IN THE CANCELLATION OF YOUR SURGERY PATIENT SIGNATURE_________________________________  NURSE SIGNATURE__________________________________  ________________________________________________________________________   Sheena Taylor  An incentive spirometer is a tool that can help keep your lungs clear and active. This tool measures how well you are filling your lungs with each breath. Taking long deep breaths may help reverse or decrease the chance of developing breathing (pulmonary) problems (especially infection) following:  A long period of time when you are unable to move or be active. BEFORE THE PROCEDURE   If the spirometer includes an indicator to show your best effort, your nurse or respiratory therapist will set it to a desired goal.  If possible, sit up straight or lean slightly forward. Try not to slouch.  Hold the incentive spirometer in an upright position. INSTRUCTIONS FOR USE  1. Sit on the edge of your bed if possible, or sit up as far as you can in bed or on a chair. 2. Hold the incentive spirometer in an upright position. 3. Breathe  out normally. 4. Place the mouthpiece in your mouth and seal your lips tightly around it. 5. Breathe in slowly and as deeply as possible, raising the piston or the ball toward the top of the column. 6. Hold your breath for 3-5 seconds or for as long as possible. Allow the piston or ball to fall to the bottom of the column. 7. Remove the mouthpiece from your mouth and breathe out normally. 8. Rest for a few seconds and repeat Steps 1 through 7 at least 10 times every 1-2 hours when you are awake. Take your time and take a few normal breaths between deep breaths. 9. The spirometer may include an indicator to show your best effort. Use the indicator as a goal to work toward during each repetition. 10. After each set of 10 deep breaths, practice coughing to be sure your lungs are clear. If you have an incision (the cut made at the time of surgery), support your incision when coughing by placing a pillow or rolled up towels firmly against it. Once you are able to get out of bed, walk around indoors and cough well. You may stop using the incentive spirometer when instructed by your caregiver.  RISKS AND COMPLICATIONS  Take your time so you do not get dizzy or light-headed.  If you are in pain, you may need to take or ask for pain medication before doing incentive spirometry. It is harder to take a deep breath if you are having pain. AFTER USE  Rest and breathe slowly and easily.  It can be helpful to keep track of a log of your progress. Your caregiver can provide you with a simple table to help with this. If you are using the spirometer at home, follow these instructions: [place] IF:   You are having difficultly using the spirometer.  You have trouble using the spirometer as often as instructed.  Your pain medication is not giving enough relief while using the spirometer.  You develop fever of 100.5 F (38.1 C) or higher. SEEK IMMEDIATE MEDICAL CARE IF:   You cough up bloody  sputum that had not been present before.  You develop fever of 102 F (38.9 C) or greater.  You develop worsening pain at or near the incision site. MAKE SURE YOU:   Understand these instructions.  Will watch your condition.  Will get help right away if you are not doing well or get worse. Document Released: 08/13/2006 Document Revised: 06/25/2011 Document Reviewed: 10/14/2006 Exit[employer] Patient Information 2014 ExitCare, [place].   ________________________________________________________________________  WHAT IS A BLOOD TRANSFUSION? Blood Transfusion Information  A transfusion is the replacement of blood or some of its parts. Blood is made up of multiple cells which provide different functions.  Red blood cells carry oxygen and are used for blood loss replacement.  White blood cells fight against infection.  Platelets control bleeding.  Plasma helps clot blood.  Other blood products are available for specialized needs, such as hemophilia or other clotting disorders. BEFORE THE TRANSFUSION  Who gives blood for transfusions?   Healthy volunteers who are fully evaluated to make sure their blood is safe. This is blood bank blood. Transfusion therapy is the safest it has ever been in the practice of medicine. Before blood is taken from a donor, a complete history is taken to make sure that person has no history of diseases nor engages in risky social behavior (examples are intravenous drug use or sexual activity with multiple partners). The donor's travel history is screened to minimize risk of transmitting infections, such as malaria. The donated blood is tested for signs of infectious diseases, such as HIV and hepatitis. The blood is then tested to be sure it is compatible with you in order to minimize the chance of a transfusion reaction. If you or a relative donates blood, this is often done in anticipation of surgery and is not appropriate for emergency situations. It takes many days  to process the donated blood. RISKS AND COMPLICATIONS Although transfusion therapy is very safe and saves many lives, the main dangers of transfusion include:   Getting an infectious disease.  Developing a transfusion reaction. This is an allergic reaction to something in the blood you were given. Every precaution is taken to prevent this. The decision to have a blood transfusion has been considered carefully by your caregiver before blood is given. Blood is not given unless the benefits outweigh the risks. AFTER THE TRANSFUSION  Right after receiving a blood transfusion, you will usually feel much better and more energetic. This is especially true if your red blood cells have gotten low (anemic). The transfusion raises the level of the red blood cells which carry oxygen, and this usually causes an energy increase.  The nurse administering the transfusion will monitor you carefully for complications. HOME CARE INSTRUCTIONS  No special instructions are needed after a transfusion. You may find your energy is better. Speak with your caregiver about any limitations on activity for underlying diseases you may have. SEEK MEDICAL CARE IF:   Your condition is not improving after your transfusion.  You develop redness or irritation at the intravenous (IV) site. SEEK IMMEDIATE MEDICAL CARE IF:  Any of the following symptoms occur over the next 12 hours:  Shaking chills.  You have a temperature by mouth above 102 F (38.9 C), not controlled by medicine.  Chest, back, or muscle pain.  People around you feel you are not acting correctly or are confused.  Shortness of breath or difficulty breathing.  Dizziness and fainting.  You get a rash or develop hives.  You have a decrease  in urine output.  Your urine turns a dark color or changes to pink, red, or brown. Any of the following symptoms occur over the next 10 days:  You have a temperature by mouth above 102 F (38.9 C), not controlled  by medicine.  Shortness of breath.  Weakness after normal activity.  The white part of the eye turns yellow (jaundice).  You have a decrease in the amount of urine or are urinating less often.  Your urine turns a dark color or changes to pink, red, or brown. Document Released: 03/30/2000 Document Revised: 06/25/2011 Document Reviewed: 11/17/2007 [facility] Patient Information 2014 [place], [place].  _______________________________________________________________________

## 2019-01-22 ENCOUNTER — Encounter (HOSPITAL_COMMUNITY): Payer: Self-pay

## 2019-01-22 ENCOUNTER — Encounter (HOSPITAL_COMMUNITY)
Admission: RE | Admit: 2019-01-22 | Discharge: 2019-01-22 | Disposition: A | Discharge status: Home / Self Care | Payer: Medicare Other | Source: Ambulatory Visit | Attending: Orthopedic Surgery | Admitting: Orthopedic Surgery

## 2019-01-22 ENCOUNTER — Other Ambulatory Visit: Payer: Self-pay

## 2019-01-22 DIAGNOSIS — Z7901 Long term (current) use of anticoagulants: Secondary | ICD-10-CM | POA: Diagnosis not present

## 2019-01-22 DIAGNOSIS — Z01812 Encounter for preprocedural laboratory examination: Secondary | ICD-10-CM | POA: Insufficient documentation

## 2019-01-22 DIAGNOSIS — Z720 Tobacco use: Secondary | ICD-10-CM | POA: Insufficient documentation

## 2019-01-22 DIAGNOSIS — Z90711 Acquired absence of uterus with remaining cervical stump: Secondary | ICD-10-CM | POA: Insufficient documentation

## 2019-01-22 DIAGNOSIS — M1712 Unilateral primary osteoarthritis, left knee: Secondary | ICD-10-CM | POA: Diagnosis not present

## 2019-01-22 DIAGNOSIS — Z8759 Personal history of other complications of pregnancy, childbirth and the puerperium: Secondary | ICD-10-CM | POA: Diagnosis not present

## 2019-01-22 DIAGNOSIS — Z79899 Other long term (current) drug therapy: Secondary | ICD-10-CM | POA: Insufficient documentation

## 2019-01-22 DIAGNOSIS — I480 Paroxysmal atrial fibrillation: Secondary | ICD-10-CM | POA: Diagnosis not present

## 2019-01-22 DIAGNOSIS — Z96651 Presence of right artificial knee joint: Secondary | ICD-10-CM | POA: Insufficient documentation

## 2019-01-22 DIAGNOSIS — I10 Essential (primary) hypertension: Secondary | ICD-10-CM | POA: Insufficient documentation

## 2019-01-22 HISTORY — DX: Cardiac arrhythmia, unspecified: I49.9

## 2019-01-22 HISTORY — DX: Pneumonia, unspecified organism: J18.9

## 2019-01-22 LAB — CBC
HCT: 41.3 % (ref 36.0–46.0)
Hemoglobin: 13.4 g/dL (ref 12.0–15.0)
MCH: 32.5 pg (ref 26.0–34.0)
MCHC: 32.4 g/dL (ref 30.0–36.0)
MCV: 100.2 fL — ABNORMAL HIGH (ref 80.0–100.0)
Platelets: 302 10*3/uL (ref 150–400)
RBC: 4.12 MIL/uL (ref 3.87–5.11)
RDW: 12.1 % (ref 11.5–15.5)
WBC: 7.6 10*3/uL (ref 4.0–10.5)
nRBC: 0 % (ref 0.0–0.2)

## 2019-01-22 LAB — BASIC METABOLIC PANEL
Anion gap: 11 (ref 5–15)
BUN: 11 mg/dL (ref 8–23)
CO2: 27 mmol/L (ref 22–32)
Calcium: 9.6 mg/dL (ref 8.9–10.3)
Chloride: 104 mmol/L (ref 98–111)
Creatinine, Ser: 0.64 mg/dL (ref 0.44–1.00)
GFR calc Af Amer: >60 mL/min (ref >60)
GFR calc non Af Amer: >60 mL/min (ref >60)
Glucose, Bld: 103 mg/dL — ABNORMAL HIGH (ref 70–99)
Potassium: 5 mmol/L (ref 3.5–5.1)
Sodium: 142 mmol/L (ref 135–145)

## 2019-01-22 LAB — SURGICAL PCR SCREEN
MRSA, PCR: NEGATIVE
Staphylococcus aureus: NEGATIVE

## 2019-01-22 NOTE — Progress Notes (Signed)
PCP - [name] Cardiologist - Dr. Virgina Jock  Clearance 01-01-19 epic  Chest x-ray -  EKG - 01-01-19 epic Stress Test -  ECHO - 12-17-18 epic Cardiac Cath -   Sleep Study -  CPAP -   Fasting Blood Sugar -  Checks Blood Sugar _____ times a day  Blood Thinner Instructions:Elequis   01-22-19 last dose per Dr. Alvan Dame Aspirin Instructions: Last Dose:  Anesthesia review: elequis   Patient denies shortness of breath, fever, cough and chest pain at PAT appointment   Patient verbalized understanding of instructions that were given to them at the PAT appointment. Patient was also instructed that they will need to review over the PAT instructions again at home before surgery.

## 2019-01-23 ENCOUNTER — Other Ambulatory Visit (HOSPITAL_COMMUNITY)
Admission: RE | Admit: 2019-01-23 | Discharge: 2019-01-23 | Disposition: A | Discharge status: Home / Self Care | Payer: Medicare Other | Source: Ambulatory Visit | Attending: Orthopedic Surgery | Admitting: Orthopedic Surgery

## 2019-01-23 DIAGNOSIS — Z20828 Contact with and (suspected) exposure to other viral communicable diseases: Secondary | ICD-10-CM | POA: Diagnosis not present

## 2019-01-23 DIAGNOSIS — Z01812 Encounter for preprocedural laboratory examination: Secondary | ICD-10-CM | POA: Insufficient documentation

## 2019-01-26 LAB — NOVEL CORONAVIRUS, NAA (HOSP ORDER, SEND-OUT TO REF LAB; TAT 18-24 HRS): SARS-CoV-2, NAA: NOT DETECTED

## 2019-01-26 NOTE — Anesthesia Preprocedure Evaluation (Addendum)
Anesthesia Evaluation  Patient identified by MRN, date of birth, ID band Patient awake    Reviewed: Allergy & Precautions, NPO status , Patient's Chart, lab work & pertinent test results  History of Anesthesia Complications Negative for: history of anesthetic complications  Airway Mallampati: III  TM Distance: >3 FB Neck ROM: Full    Dental  (+) Dental Advisory Given, Teeth Intact,    Pulmonary Current SmokerPatient did not abstain from smoking.,    breath sounds clear to auscultation       Cardiovascular hypertension, Pt. on medications + dysrhythmias Atrial Fibrillation  Rhythm:Irregular + Systolic murmurs Atrial fibrillation: Paroxysmal. In sinus rhythm today. CHA2DS2VASc score 4, annual stroke risk 5%. Continue starting eliquis 5 mg bid.  Low suspicion for ischemia. While ischemia workup can be performed at some point, I do not think it is necessary prior to low cardiac risk surgery of knee arthroplasty. I do not think this will mitigate her perioperative risk, which is low.  If surgery can be performed without interrupting eliquis, it would be preferable. If eliquis must be stopped, recommend doing so 2 days before the surgery, and resume soon after the surgery.   Hypertension: Better controled.  F/u in 3 months  [place], MD [facility]. [facility] Pager: [phone] Office: [phone] If no answer Cell [phone]   Neuro/Psych PSYCHIATRIC DISORDERS Depression negative neurological ROS     GI/Hepatic negative GI ROS, Neg liver ROS,   Endo/Other  negative endocrine ROS  Renal/GU negative Renal ROS     Musculoskeletal  (+) Arthritis ,   Abdominal   Peds  Hematology eliquis last dose 10/8   Anesthesia Other Findings   1. The left ventricle has hyperdynamic systolic function, with an ejection fraction of >65%. The cavity size was normal. Left ventricular diastolic Doppler parameters  are consistent with impaired relaxation. No evidence of left ventricular regional wall  motion abnormalities.  2. The right ventricle has normal systolic function. The cavity was normal. There is no increase in right ventricular wall thickness.  3. Mild ot moderate mitral regurgitation.  4. Mild tricuspid regurgitation. PASP 23 mmhg.  Reproductive/Obstetrics                           Anesthesia Physical Anesthesia Plan  ASA: II  Anesthesia Plan: MAC, Spinal and Regional   Post-op Pain Management:    Induction: Intravenous  PONV Risk Score and Plan: 1 and Treatment may vary due to age or medical condition and Propofol infusion  Airway Management Planned: Nasal Cannula  Additional Equipment: None  Intra-op Plan:   Post-operative Plan:   Informed Consent: I have reviewed the patients History and Physical, chart, labs and discussed the procedure including the risks, benefits and alternatives for the proposed anesthesia with the patient or authorized representative who has indicated his/her understanding and acceptance.     Dental advisory given  Plan Discussed with: CRNA and Surgeon  Anesthesia Plan Comments: (See PAT note 01/22/2019, Konrad Felix, PA-C)       Anesthesia Quick Evaluation

## 2019-01-26 NOTE — H&P (Signed)
TOTAL KNEE ADMISSION H&P  Patient is being admitted for left total knee arthroplasty.  Subjective:  Chief Complaint:   Left knee primary OA / pain  HPI: Sheena Taylor, 84 y.o. female, has a history of pain and functional disability in the left knee due to arthritis and has failed non-surgical conservative treatments for greater than 12 weeks to include NSAID's and/or analgesics, corticosteriod injections and activity modification.  Onset of symptoms was gradual, starting 1+ years ago with gradually worsening course since that time. The patient noted prior procedures on the knee to include  arthroplasty on the right knee(s).  Patient currently rates pain in the left knee(s) at 9 out of 10 with activity. Patient has night pain, worsening of pain with activity and weight bearing, pain that interferes with activities of daily living, pain with passive range of motion, crepitus and joint swelling.  Patient has evidence of periarticular osteophytes and joint space narrowing by imaging studies.  There is no active infection.  Risks, benefits and expectations were discussed with the patient.  Risks including but not limited to the risk of anesthesia, blood clots, nerve damage, blood vessel damage, failure of the prosthesis, infection and up to and including death.  Patient understand the risks, benefits and expectations and wishes to proceed with surgery.    PCP:    [name], MD  D/C Plans:       Home   Post-op Meds:       No Rx given  Tranexamic Acid:      To be given - IV   Decadron:      Is to be given  FYI:      Eliquis             Dilaudid (can't tolerate Codeine)  DME:     Pt already has equipment   PT:        OPPT    Pharmacy:      [facility] [place]    Patient Active Problem List   Diagnosis Date Noted  . Preop cardiovascular exam 12/16/2018  . Paroxysmal atrial fibrillation ([place]) 12/16/2018  . Murmur 12/16/2018  . Essential hypertension 12/16/2018  . Obese  01/19/2015  . S/P right TKA 01/17/2015   Past Medical History:  Diagnosis Date  . Arthritis   . Atrial fibrillation ([place])   . Complication of anesthesia    Dr. Winfred Leeds had problems putting spinal in approximately 10-15 years ago and had to have general   . Depression   . Dysrhythmia    a fib  . H/O vein stripping    bilateral  . 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 History of laparotomy    for ectopic pregnancy  . Hypertension   . Macular degeneration   . Pneumonia    as a child  . S/P bunionectomy    bilateral and hammer toe-correction    Past Surgical History:  Procedure Laterality Date  . ABDOMINAL HYSTERECTOMY     partial  . BREAST EXCISIONAL BIOPSY Left   . BREAST EXCISIONAL BIOPSY Right   . BUNIONECTOMY     bilateral with hammer toe and amputation 2nd right toe with this  . LAPAROTOMY     for ruptured ectopic pregnancy  . TOTAL KNEE ARTHROPLASTY Right 01/17/2015   Procedure: TOTAL RIGHT KNEE ARTHROPLASTY;  Surgeon: Paralee Cancel, MD;  Location: [facility];  Service: Orthopedics;  Laterality: Right;  Marland Kitchen VARICOSE VEIN SURGERY  bilateral    No current facility-administered medications for this encounter.    Current Outpatient Medications  Medication Sig Dispense Refill Last Dose  . apixaban (ELIQUIS) 5 MG TABS tablet Take 1 tablet (5 mg total) by mouth 2 (two) times daily. 180 tablet 2   . hydrochlorothiazide (HYDRODIURIL) 25 MG tablet Take 25 mg by mouth daily.     Marland Kitchen lisinopril (ZESTRIL) 20 MG tablet Take 1 tablet (20 mg total) by mouth daily. 90 tablet 2   . Lutein 6 MG TABS Take 6 mg by mouth 2 (two) times daily.      . Multiple Vitamins-Minerals (OCUVITE PRESERVISION PO) Take 1 tablet by mouth 2 (two) times daily.     . pravastatin (PRAVACHOL) 20 MG tablet Take 20 mg by mouth daily.     . traMADol (ULTRAM) 50 MG tablet Take 50 mg by mouth every 6 (six) hours as needed for moderate pain.     . vitamin C (ASCORBIC ACID) 500 MG  tablet Take 500 mg by mouth daily after breakfast.      Allergies  Allergen Reactions  . Codeine Nausea And Vomiting    Social History   Tobacco Use  . Smoking status: Light Tobacco Smoker    Types: Cigarettes  . Smokeless tobacco: Never Used  . Tobacco comment: smokes about once a week when having a glass of wine  Substance Use Topics  . Alcohol use: Yes    Comment: occassionally  wine    Family History  Problem Relation Age of Onset  . Heart disease Mother   . Breast cancer Neg Hx      Review of Systems  Constitutional: Negative.   HENT: Negative.   Eyes: Negative.   Respiratory: Negative.   Cardiovascular: Negative.   Gastrointestinal: Negative.   Genitourinary: Negative.   Musculoskeletal: Positive for joint pain.  Skin: Negative.   Neurological: Negative.   Endo/Heme/Allergies: Negative.   Psychiatric/Behavioral: Positive for depression.    Objective:  Physical Exam  Constitutional: She is oriented to person, place, and time. She appears well-developed.  HENT:  Head: Normocephalic.  Eyes: Pupils are equal, round, and reactive to light.  Neck: Neck supple. No JVD present. No tracheal deviation present. No thyromegaly present.  Cardiovascular: Normal rate, regular rhythm and intact distal pulses.  Respiratory: Effort normal and breath sounds normal. No respiratory distress. She has no wheezes.  GI: Soft. There is no abdominal tenderness. There is no guarding.  Musculoskeletal:     Left knee: She exhibits swelling and bony tenderness. She exhibits no ecchymosis, no deformity, no laceration and no erythema. Tenderness found.  Lymphadenopathy:    She has no cervical adenopathy.  Neurological: She is alert and oriented to person, place, and time.  Skin: Skin is warm and dry.  Psychiatric: She has a normal mood and affect.      Labs:  Estimated body mass index is 26.78 kg/m as calculated from the following:   Height as of 01/22/19: 5\' 4"  (1.626 m).    Weight as of 01/22/19: 70.8 kg.   Imaging Review Plain radiographs demonstrate severe degenerative joint disease of the left knee(s).  The bone quality appears to be good for age and reported activity level.      Assessment/Plan:  End stage arthritis, left knee   The patient history, physical examination, clinical judgment of the provider and imaging studies are consistent with end stage degenerative joint disease of the left knee(s) and total knee arthroplasty is deemed medically necessary.  The treatment options including medical management, injection therapy arthroscopy and arthroplasty were discussed at length. The risks and benefits of total knee arthroplasty were presented and reviewed. The risks due to aseptic loosening, infection, stiffness, patella tracking problems, thromboembolic complications and other imponderables were discussed. The patient acknowledged the explanation, agreed to proceed with the plan and consent was signed. Patient is being admitted for inpatient treatment for surgery, pain control, PT, OT, prophylactic antibiotics, VTE prophylaxis, progressive ambulation and ADL's and discharge planning. The patient is planning to be discharged home.     Patient's anticipated LOS is less than 2 midnights, meeting these requirements: - Lives within 1 hour of care - Has a competent adult at home to recover with post-op recover - NO history of  - Chronic pain requiring opiods  - Diabetes  - Coronary Artery Disease  - Heart failure  - Heart attack  - Stroke  - DVT/VTE  - Respiratory Failure/COPD  - Renal failure  - Anemia  - Advanced Liver disease     [place] Pugh. Kashara Blocher   PA-C  01/26/2019, 8:27 PM

## 2019-01-26 NOTE — Progress Notes (Signed)
Anesthesia Chart Review   Case: [id number] Date/Time: 01/27/19 0950   Procedure: TOTAL KNEE ARTHROPLASTY (Left Knee) - 70 mins   Anesthesia type: Spinal   Pre-op diagnosis: Left knee osteoarthritis   [facility] ROOM 09 / [facility]   Surgeon: Paralee Cancel, MD      DISCUSSION:84 y.o. light tobacco smoker with h/o HTN, PAF (on Eliquis), left knee OA scheduled for above procedure 01/27/2019 with Dr. Paralee Cancel.   Pt last seen by cardiologist, Dr. Vernell Leep, 01/01/2019 for preoperative evaluation.  Per OV note, "Paroxysmal. In sinus rhythm today. CHA2DS2VASc score 4, annual stroke risk 5%. Continue starting eliquis 5 mg bid.  Low suspicion for ischemia. While ischemia workup can be performed at some point, I do not think it is necessary prior to low cardiac risk surgery of knee arthroplasty. I do not think this will mitigate her perioperative risk, which is low.  If surgery can be performed without interrupting eliquis, it would be preferable. If eliquis must be stopped, recommend doing so 2 days before the surgery, and resume soon after the surgery."  Anticipate pt can proceed with planned procedure barring acute status change.   VS: BP (!) 155/86   Temp 36.7 C (Oral)   Resp 16   Ht 5\' 4"  (1.626 m)   Wt 70.8 kg   SpO2 98%   BMI 26.78 kg/m   PROVIDERS: [name], MD is PCP   Vernell Leep, MD is Cardiologist  LABS: Labs reviewed: Acceptable for surgery. (all labs ordered are listed, but only abnormal results are displayed)  Labs Reviewed  BASIC METABOLIC PANEL - Abnormal; Notable for the following components:      Result Value   Glucose, Bld 103 (*)    All other components within normal limits  CBC - Abnormal; Notable for the following components:   MCV 100.2 (*)    All other components within normal limits  SURGICAL PCR SCREEN  TYPE AND SCREEN     IMAGES:   EKG: 01/01/2019 Rate 70 bpm Sinus rhythm 70 bpm Frequent PACs  Left atrial enlargement    CV: Echo 12/17/2018 IMPRESSIONS   1. The left ventricle has hyperdynamic systolic function, with an ejection fraction of >65%. The cavity size was normal. Left ventricular diastolic Doppler parameters are consistent with impaired relaxation. No evidence of left ventricular regional wall  motion abnormalities.  2. The right ventricle has normal systolic function. The cavity was normal. There is no increase in right ventricular wall thickness.  3. Mild ot moderate mitral regurgitation.  4. Mild tricuspid regurgitation. PASP 23 mmhg. Past Medical History:  Diagnosis Date  . Arthritis   . Atrial fibrillation ([place])   . Complication of anesthesia    Dr. Winfred Leeds had problems putting spinal in approximately 10-15 years ago and had to have general   . Depression   . Dysrhythmia    a fib  . H/O vein stripping    bilateral  . 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 History of laparotomy    for ectopic pregnancy  . Hypertension   . Macular degeneration   . Pneumonia    as a child  . S/P bunionectomy    bilateral and hammer toe-correction    Past Surgical History:  Procedure Laterality Date  . ABDOMINAL HYSTERECTOMY     partial  . BREAST EXCISIONAL BIOPSY Left   . BREAST EXCISIONAL BIOPSY Right   . BUNIONECTOMY  bilateral with hammer toe and amputation 2nd right toe with this  . LAPAROTOMY     for ruptured ectopic pregnancy  . TOTAL KNEE ARTHROPLASTY Right 01/17/2015   Procedure: TOTAL RIGHT KNEE ARTHROPLASTY;  Surgeon: Paralee Cancel, MD;  Location: [facility];  Service: Orthopedics;  Laterality: Right;  Marland Kitchen VARICOSE VEIN SURGERY     bilateral    MEDICATIONS: . apixaban (ELIQUIS) 5 MG TABS tablet  . hydrochlorothiazide (HYDRODIURIL) 25 MG tablet  . lisinopril (ZESTRIL) 20 MG tablet  . Lutein 6 MG TABS  . Multiple Vitamins-Minerals (OCUVITE PRESERVISION PO)  . pravastatin (PRAVACHOL) 20 MG tablet  . traMADol (ULTRAM) 50 MG tablet  . vitamin C  (ASCORBIC ACID) 500 MG tablet   No current facility-administered medications for this encounter.     Maia Plan [facility] Pre-Surgical Testing [phone] 01/26/19  9:49 AM

## 2019-01-27 ENCOUNTER — Observation Stay (HOSPITAL_COMMUNITY)
Admission: RE | Admit: 2019-01-27 | Discharge: 2019-01-28 | Disposition: A | Discharge status: Home / Self Care | Payer: Medicare Other | Source: Other Acute Inpatient Hospital | Attending: Orthopedic Surgery | Admitting: Orthopedic Surgery

## 2019-01-27 ENCOUNTER — Ambulatory Visit (HOSPITAL_COMMUNITY): Payer: Medicare Other | Admitting: Physician Assistant

## 2019-01-27 ENCOUNTER — Encounter (HOSPITAL_COMMUNITY): Payer: Self-pay

## 2019-01-27 ENCOUNTER — Encounter (HOSPITAL_COMMUNITY)
Admission: RE | Disposition: A | Discharge status: Home / Self Care | Payer: Self-pay | Source: Other Acute Inpatient Hospital | Attending: Orthopedic Surgery

## 2019-01-27 ENCOUNTER — Other Ambulatory Visit: Payer: Self-pay

## 2019-01-27 ENCOUNTER — Ambulatory Visit (HOSPITAL_COMMUNITY): Payer: Medicare Other | Admitting: Anesthesiology

## 2019-01-27 DIAGNOSIS — M659 Synovitis and tenosynovitis, unspecified: Secondary | ICD-10-CM | POA: Insufficient documentation

## 2019-01-27 DIAGNOSIS — I10 Essential (primary) hypertension: Secondary | ICD-10-CM | POA: Insufficient documentation

## 2019-01-27 DIAGNOSIS — Z7901 Long term (current) use of anticoagulants: Secondary | ICD-10-CM | POA: Insufficient documentation

## 2019-01-27 DIAGNOSIS — Z89421 Acquired absence of other right toe(s): Secondary | ICD-10-CM | POA: Insufficient documentation

## 2019-01-27 DIAGNOSIS — M1712 Unilateral primary osteoarthritis, left knee: Principal | ICD-10-CM | POA: Insufficient documentation

## 2019-01-27 DIAGNOSIS — G8918 Other acute postprocedural pain: Secondary | ICD-10-CM | POA: Diagnosis not present

## 2019-01-27 DIAGNOSIS — I480 Paroxysmal atrial fibrillation: Secondary | ICD-10-CM | POA: Diagnosis not present

## 2019-01-27 DIAGNOSIS — Z96652 Presence of left artificial knee joint: Secondary | ICD-10-CM

## 2019-01-27 DIAGNOSIS — M25762 Osteophyte, left knee: Secondary | ICD-10-CM | POA: Diagnosis not present

## 2019-01-27 DIAGNOSIS — M25462 Effusion, left knee: Secondary | ICD-10-CM | POA: Diagnosis not present

## 2019-01-27 DIAGNOSIS — F17210 Nicotine dependence, cigarettes, uncomplicated: Secondary | ICD-10-CM | POA: Insufficient documentation

## 2019-01-27 DIAGNOSIS — Z79899 Other long term (current) drug therapy: Secondary | ICD-10-CM | POA: Diagnosis not present

## 2019-01-27 DIAGNOSIS — Z96651 Presence of right artificial knee joint: Secondary | ICD-10-CM | POA: Insufficient documentation

## 2019-01-27 HISTORY — PX: TOTAL KNEE ARTHROPLASTY: SHX125

## 2019-01-27 HISTORY — DX: Presence of left artificial knee joint: Z96.652

## 2019-01-27 LAB — TYPE AND SCREEN
ABO/RH(D): A NEG
Antibody Screen: NEGATIVE

## 2019-01-27 SURGERY — ARTHROPLASTY, KNEE, TOTAL
Anesthesia: Monitor Anesthesia Care | Site: Knee | Laterality: Left

## 2019-01-27 MED ORDER — ALUM & MAG HYDROXIDE-SIMETH 200-200-20 MG/5ML PO SUSP: 15.0000 mL | ORAL | Status: DC | PRN

## 2019-01-27 MED ORDER — OXYCODONE HCL 5 MG PO TABS: 5.0000 mg | ORAL_TABLET | Freq: Once | ORAL | Status: DC | PRN

## 2019-01-27 MED ORDER — FERROUS SULFATE 325 (65 FE) MG PO TABS
325.0000 mg | ORAL_TABLET | Freq: Two times a day (BID) | ORAL | Status: DC
  Administered 2019-01-27 – 2019-01-28 (×2): 325 mg via ORAL
  Filled 2019-01-27 (×2): qty 1

## 2019-01-27 MED ORDER — KETOROLAC TROMETHAMINE 30 MG/ML IJ SOLN
INTRAMUSCULAR | Status: AC
  Filled 2019-01-27: qty 1

## 2019-01-27 MED ORDER — PHENOL 1.4 % MT LIQD
1.0000 | OROMUCOSAL | Status: DC | PRN
  Filled 2019-01-27: qty 177

## 2019-01-27 MED ORDER — POVIDONE-IODINE 10 % EX SWAB
2.0000 "application " | Freq: Once | CUTANEOUS | Status: AC
  Administered 2019-01-27: 2 via TOPICAL

## 2019-01-27 MED ORDER — MAGNESIUM CITRATE PO SOLN: 1.0000 | Freq: Once | ORAL | Status: DC | PRN

## 2019-01-27 MED ORDER — PROPOFOL 10 MG/ML IV BOLUS
INTRAVENOUS | Status: AC
  Filled 2019-01-27: qty 20

## 2019-01-27 MED ORDER — BUPIVACAINE HCL (PF) 0.75 % IJ SOLN
INTRAMUSCULAR | Status: DC | PRN
  Administered 2019-01-27: 1.8 mL via INTRATHECAL

## 2019-01-27 MED ORDER — DIPHENHYDRAMINE HCL 12.5 MG/5ML PO ELIX: 12.5000 mg | ORAL_SOLUTION | ORAL | Status: DC | PRN

## 2019-01-27 MED ORDER — FENTANYL CITRATE (PF) 100 MCG/2ML IJ SOLN: 25.0000 ug | INTRAMUSCULAR | Status: DC | PRN

## 2019-01-27 MED ORDER — METHOCARBAMOL 500 MG PO TABS
500.0000 mg | ORAL_TABLET | Freq: Four times a day (QID) | ORAL | Status: DC | PRN
  Administered 2019-01-27 – 2019-01-28 (×2): 500 mg via ORAL
  Filled 2019-01-27 (×2): qty 1

## 2019-01-27 MED ORDER — PRAVASTATIN SODIUM 20 MG PO TABS
20.0000 mg | ORAL_TABLET | Freq: Every day | ORAL | Status: DC
  Administered 2019-01-28: 20 mg via ORAL
  Filled 2019-01-27: qty 1

## 2019-01-27 MED ORDER — BUPIVACAINE HCL (PF) 0.25 % IJ SOLN
INTRAMUSCULAR | Status: AC
  Filled 2019-01-27: qty 30

## 2019-01-27 MED ORDER — PROPOFOL 10 MG/ML IV BOLUS
INTRAVENOUS | Status: AC
  Filled 2019-01-27: qty 60

## 2019-01-27 MED ORDER — FENTANYL CITRATE (PF) 100 MCG/2ML IJ SOLN
INTRAMUSCULAR | Status: DC | PRN
  Administered 2019-01-27 (×2): 50 ug via INTRAVENOUS

## 2019-01-27 MED ORDER — DOCUSATE SODIUM 100 MG PO CAPS
100.0000 mg | ORAL_CAPSULE | Freq: Two times a day (BID) | ORAL | Status: DC
  Administered 2019-01-27 – 2019-01-28 (×2): 100 mg via ORAL
  Filled 2019-01-27 (×2): qty 1

## 2019-01-27 MED ORDER — PHENYLEPHRINE HCL (PRESSORS) 10 MG/ML IV SOLN
INTRAVENOUS | Status: DC | PRN
  Administered 2019-01-27: 120 ug via INTRAVENOUS
  Administered 2019-01-27 (×2): 80 ug via INTRAVENOUS

## 2019-01-27 MED ORDER — ONDANSETRON HCL 4 MG/2ML IJ SOLN: 4.0000 mg | Freq: Four times a day (QID) | INTRAMUSCULAR | Status: DC | PRN

## 2019-01-27 MED ORDER — FENTANYL CITRATE (PF) 100 MCG/2ML IJ SOLN
50.0000 ug | INTRAMUSCULAR | Status: AC
  Administered 2019-01-27: 50 ug via INTRAVENOUS
  Filled 2019-01-27: qty 2

## 2019-01-27 MED ORDER — FENTANYL CITRATE (PF) 100 MCG/2ML IJ SOLN
INTRAMUSCULAR | Status: AC
  Filled 2019-01-27: qty 2

## 2019-01-27 MED ORDER — MENTHOL 3 MG MT LOZG: 1.0000 | LOZENGE | OROMUCOSAL | Status: DC | PRN

## 2019-01-27 MED ORDER — DEXAMETHASONE SODIUM PHOSPHATE 10 MG/ML IJ SOLN
10.0000 mg | Freq: Once | INTRAMUSCULAR | Status: AC
  Administered 2019-01-27: 10 mg via INTRAVENOUS

## 2019-01-27 MED ORDER — SODIUM CHLORIDE 0.9 % IR SOLN
Status: DC | PRN
  Administered 2019-01-27: 1000 mL

## 2019-01-27 MED ORDER — BUPIVACAINE HCL (PF) 0.25 % IJ SOLN
INTRAMUSCULAR | Status: DC | PRN
  Administered 2019-01-27: 30 mL

## 2019-01-27 MED ORDER — METOCLOPRAMIDE HCL 5 MG PO TABS
5.0000 mg | ORAL_TABLET | Freq: Three times a day (TID) | ORAL | Status: DC | PRN
  Filled 2019-01-27: qty 2

## 2019-01-27 MED ORDER — BISACODYL 10 MG RE SUPP: 10.0000 mg | Freq: Every day | RECTAL | Status: DC | PRN

## 2019-01-27 MED ORDER — SODIUM CHLORIDE (PF) 0.9 % IJ SOLN
INTRAMUSCULAR | Status: AC
  Filled 2019-01-27: qty 50

## 2019-01-27 MED ORDER — TRANEXAMIC ACID-NACL 1000-0.7 MG/100ML-% IV SOLN
1000.0000 mg | Freq: Once | INTRAVENOUS | Status: AC
  Administered 2019-01-27: 1000 mg via INTRAVENOUS
  Filled 2019-01-27: qty 100

## 2019-01-27 MED ORDER — HYDROMORPHONE HCL 2 MG PO TABS
2.0000 mg | ORAL_TABLET | ORAL | Status: DC | PRN
  Administered 2019-01-27 – 2019-01-28 (×6): 2 mg via ORAL
  Filled 2019-01-27 (×6): qty 1

## 2019-01-27 MED ORDER — MIDAZOLAM HCL 2 MG/2ML IJ SOLN
1.0000 mg | INTRAMUSCULAR | Status: AC
  Administered 2019-01-27: 1 mg via INTRAVENOUS
  Filled 2019-01-27: qty 2

## 2019-01-27 MED ORDER — ACETAMINOPHEN 10 MG/ML IV SOLN: 1000.0000 mg | Freq: Once | INTRAVENOUS | Status: DC | PRN

## 2019-01-27 MED ORDER — SODIUM CHLORIDE 0.9 % IV SOLN
INTRAVENOUS | Status: DC
  Administered 2019-01-27: 14:00:00 via INTRAVENOUS

## 2019-01-27 MED ORDER — OXYCODONE HCL 5 MG/5ML PO SOLN: 5.0000 mg | Freq: Once | ORAL | Status: DC | PRN

## 2019-01-27 MED ORDER — CHLORHEXIDINE GLUCONATE 4 % EX LIQD: 60.0000 mL | Freq: Once | CUTANEOUS | Status: DC

## 2019-01-27 MED ORDER — KETOROLAC TROMETHAMINE 30 MG/ML IJ SOLN
INTRAMUSCULAR | Status: DC | PRN
  Administered 2019-01-27: 30 mg

## 2019-01-27 MED ORDER — SODIUM CHLORIDE (PF) 0.9 % IJ SOLN
INTRAMUSCULAR | Status: DC | PRN
  Administered 2019-01-27: 30 mL

## 2019-01-27 MED ORDER — METHOCARBAMOL 500 MG IVPB - SIMPLE MED
500.0000 mg | Freq: Four times a day (QID) | INTRAVENOUS | Status: DC | PRN
  Filled 2019-01-27: qty 50

## 2019-01-27 MED ORDER — ACETAMINOPHEN 500 MG PO TABS: 1000.0000 mg | ORAL_TABLET | Freq: Once | ORAL | Status: DC | PRN

## 2019-01-27 MED ORDER — APIXABAN 2.5 MG PO TABS
2.5000 mg | ORAL_TABLET | Freq: Two times a day (BID) | ORAL | Status: DC
  Administered 2019-01-28: 2.5 mg via ORAL
  Filled 2019-01-27: qty 1

## 2019-01-27 MED ORDER — DEXAMETHASONE SODIUM PHOSPHATE 10 MG/ML IJ SOLN
10.0000 mg | Freq: Once | INTRAMUSCULAR | Status: AC
  Administered 2019-01-28: 10 mg via INTRAVENOUS
  Filled 2019-01-27: qty 1

## 2019-01-27 MED ORDER — CEFAZOLIN SODIUM-DEXTROSE 2-4 GM/100ML-% IV SOLN
2.0000 g | INTRAVENOUS | Status: AC
  Administered 2019-01-27: 2 g via INTRAVENOUS
  Filled 2019-01-27: qty 100

## 2019-01-27 MED ORDER — METOCLOPRAMIDE HCL 5 MG/ML IJ SOLN: 5.0000 mg | Freq: Three times a day (TID) | INTRAMUSCULAR | Status: DC | PRN

## 2019-01-27 MED ORDER — HYDROCHLOROTHIAZIDE 25 MG PO TABS
25.0000 mg | ORAL_TABLET | Freq: Every day | ORAL | Status: DC
  Administered 2019-01-27 – 2019-01-28 (×2): 25 mg via ORAL
  Filled 2019-01-27 (×2): qty 1

## 2019-01-27 MED ORDER — ACETAMINOPHEN 500 MG PO TABS
1000.0000 mg | ORAL_TABLET | Freq: Four times a day (QID) | ORAL | Status: AC
  Administered 2019-01-27 – 2019-01-28 (×4): 1000 mg via ORAL
  Filled 2019-01-27 (×3): qty 2

## 2019-01-27 MED ORDER — TRANEXAMIC ACID-NACL 1000-0.7 MG/100ML-% IV SOLN
1000.0000 mg | INTRAVENOUS | Status: AC
  Administered 2019-01-27: 1000 mg via INTRAVENOUS
  Filled 2019-01-27: qty 100

## 2019-01-27 MED ORDER — POLYETHYLENE GLYCOL 3350 17 G PO PACK
17.0000 g | PACK | Freq: Two times a day (BID) | ORAL | Status: DC
  Administered 2019-01-27: 17 g via ORAL
  Filled 2019-01-27: qty 1

## 2019-01-27 MED ORDER — PROPOFOL 500 MG/50ML IV EMUL
INTRAVENOUS | Status: DC | PRN
  Administered 2019-01-27: 75 ug/kg/min via INTRAVENOUS

## 2019-01-27 MED ORDER — ONDANSETRON HCL 4 MG PO TABS
4.0000 mg | ORAL_TABLET | Freq: Four times a day (QID) | ORAL | Status: DC | PRN
  Filled 2019-01-27: qty 1

## 2019-01-27 MED ORDER — ONDANSETRON HCL 4 MG/2ML IJ SOLN
INTRAMUSCULAR | Status: DC | PRN
  Administered 2019-01-27: 4 mg via INTRAVENOUS

## 2019-01-27 MED ORDER — HYDROMORPHONE HCL 1 MG/ML IJ SOLN
0.5000 mg | INTRAMUSCULAR | Status: DC | PRN
  Administered 2019-01-27: 0.5 mg via INTRAVENOUS
  Filled 2019-01-27: qty 1

## 2019-01-27 MED ORDER — CEFAZOLIN SODIUM-DEXTROSE 2-4 GM/100ML-% IV SOLN
2.0000 g | Freq: Four times a day (QID) | INTRAVENOUS | Status: AC
  Administered 2019-01-27 (×2): 2 g via INTRAVENOUS
  Filled 2019-01-27 (×2): qty 100

## 2019-01-27 MED ORDER — ACETAMINOPHEN 160 MG/5ML PO SOLN: 1000.0000 mg | Freq: Once | ORAL | Status: DC | PRN

## 2019-01-27 MED ORDER — LACTATED RINGERS IV SOLN
INTRAVENOUS | Status: DC
  Administered 2019-01-27 (×3): via INTRAVENOUS

## 2019-01-27 SURGICAL SUPPLY — 60 items
ATTUNE MED ANAT PAT 38 KNEE (Knees) ×2 IMPLANT
ATTUNE PS FEM LT SZ 6 CEM KNEE (Femur) ×2 IMPLANT
ATTUNE PSRP INSR SZ6 6 KNEE (Insert) ×2 IMPLANT
BAG ZIPLOCK 12X15 (MISCELLANEOUS) IMPLANT
BASE TIBIA ATTUNE KNEE SYS SZ6 (Knees) ×1 IMPLANT
BLADE SAW SGTL 11.0X1.19X90.0M (BLADE) IMPLANT
BLADE SAW SGTL 13.0X1.19X90.0M (BLADE) ×2 IMPLANT
BLADE SURG SZ10 CARB STEEL (BLADE) ×4 IMPLANT
BNDG ELASTIC 6X5.8 VLCR STR LF (GAUZE/BANDAGES/DRESSINGS) ×2 IMPLANT
BOWL SMART MIX CTS (DISPOSABLE) ×2 IMPLANT
CEMENT HV SMART SET (Cement) ×4 IMPLANT
COVER SURGICAL LIGHT HANDLE (MISCELLANEOUS) ×2 IMPLANT
COVER WAND RF STERILE (DRAPES) IMPLANT
CUFF TOURN SGL QUICK 34 (TOURNIQUET CUFF) ×1
CUFF TRNQT CYL 34X4.125X (TOURNIQUET CUFF) ×1 IMPLANT
DECANTER SPIKE VIAL GLASS SM (MISCELLANEOUS) ×4 IMPLANT
DERMABOND ADVANCED (GAUZE/BANDAGES/DRESSINGS) ×1
DERMABOND ADVANCED .7 DNX12 (GAUZE/BANDAGES/DRESSINGS) ×1 IMPLANT
DRAPE U-SHAPE 47X51 STRL (DRAPES) ×2 IMPLANT
DRESSING AQUACEL AG SP 3.5X10 (GAUZE/BANDAGES/DRESSINGS) ×1 IMPLANT
DRSG AQUACEL AG SP 3.5X10 (GAUZE/BANDAGES/DRESSINGS) ×2
DURAPREP 26ML APPLICATOR (WOUND CARE) ×4 IMPLANT
ELECT REM PT RETURN 15FT ADLT (MISCELLANEOUS) ×2 IMPLANT
GLOVE BIO SURGEON STRL SZ 6 (GLOVE) ×2 IMPLANT
GLOVE BIOGEL PI IND STRL 6.5 (GLOVE) ×1 IMPLANT
GLOVE BIOGEL PI IND STRL 7.5 (GLOVE) ×1 IMPLANT
GLOVE BIOGEL PI IND STRL 8.5 (GLOVE) ×1 IMPLANT
GLOVE BIOGEL PI INDICATOR 6.5 (GLOVE) ×1
GLOVE BIOGEL PI INDICATOR 7.5 (GLOVE) ×1
GLOVE BIOGEL PI INDICATOR 8.5 (GLOVE) ×1
GLOVE ECLIPSE 8.0 STRL XLNG CF (GLOVE) ×2 IMPLANT
GLOVE ORTHO TXT STRL SZ7.5 (GLOVE) ×2 IMPLANT
GOWN STRL REUS W/ TWL LRG LVL3 (GOWN DISPOSABLE) ×1 IMPLANT
GOWN STRL REUS W/TWL 2XL LVL3 (GOWN DISPOSABLE) ×2 IMPLANT
GOWN STRL REUS W/TWL LRG LVL3 (GOWN DISPOSABLE) ×3 IMPLANT
HANDPIECE INTERPULSE COAX TIP (DISPOSABLE) ×1
HOLDER FOLEY CATH W/STRAP (MISCELLANEOUS) ×2 IMPLANT
KIT TURNOVER KIT A (KITS) IMPLANT
MANIFOLD NEPTUNE II (INSTRUMENTS) ×2 IMPLANT
NDL SAFETY ECLIPSE 18X1.5 (NEEDLE) ×1 IMPLANT
NEEDLE HYPO 18GX1.5 SHARP (NEEDLE) ×1
NS IRRIG 1000ML POUR BTL (IV SOLUTION) ×2 IMPLANT
PACK TOTAL KNEE CUSTOM (KITS) ×2 IMPLANT
PIN FIX SIGMA LCS THRD HI (PIN) ×2 IMPLANT
PIN THREADED HEADED SIGMA (PIN) ×2 IMPLANT
PROTECTOR NERVE ULNAR (MISCELLANEOUS) ×2 IMPLANT
SET HNDPC FAN SPRY TIP SCT (DISPOSABLE) ×1 IMPLANT
SET PAD KNEE POSITIONER (MISCELLANEOUS) ×2 IMPLANT
SUT MNCRL AB 4-0 PS2 18 (SUTURE) ×2 IMPLANT
SUT STRATAFIX PDS+ 0 24IN (SUTURE) ×2 IMPLANT
SUT VIC AB 1 CT1 36 (SUTURE) ×2 IMPLANT
SUT VIC AB 2-0 CT1 27 (SUTURE) ×3
SUT VIC AB 2-0 CT1 TAPERPNT 27 (SUTURE) ×3 IMPLANT
SYR 3ML LL SCALE MARK (SYRINGE) ×2 IMPLANT
TIBIA ATTUNE KNEE SYS BASE SZ6 (Knees) ×2 IMPLANT
TRAY FOLEY MTR SLVR 14FR STAT (SET/KITS/TRAYS/PACK) ×2 IMPLANT
TRAY FOLEY MTR SLVR 16FR STAT (SET/KITS/TRAYS/PACK) IMPLANT
WATER STERILE IRR 1000ML POUR (IV SOLUTION) ×4 IMPLANT
WRAP KNEE MAXI GEL POST OP (GAUZE/BANDAGES/DRESSINGS) ×2 IMPLANT
YANKAUER SUCT BULB TIP 10FT TU (MISCELLANEOUS) ×2 IMPLANT

## 2019-01-27 NOTE — Transfer of Care (Signed)
Immediate Anesthesia Transfer of Care Note  Patient: Sheena Taylor  Procedure(s) Performed: TOTAL KNEE ARTHROPLASTY (Left Knee)  Patient Location: PACU  Anesthesia Type:Spinal  Level of Consciousness: awake, alert  and oriented  Airway & Oxygen Therapy: Patient Spontanous Breathing and Patient connected to face mask oxygen  Post-op Assessment: Report given to RN and Post -op Vital signs reviewed and stable  Post vital signs: Reviewed and stable  Last Vitals:  Vitals Value Taken Time  BP    Temp    Pulse 66 01/27/19 1147  Resp 16 01/27/19 1147  SpO2 100 % 01/27/19 1147  Vitals shown include unvalidated device data.  Last Pain:  Vitals:   01/27/19 0759  TempSrc: Oral         Complications: No apparent anesthesia complications

## 2019-01-27 NOTE — Care Plan (Signed)
Ortho Bundle Case Management Note  Patient Details  Name: Sheena Taylor MRN: [id number] Date of Birth: November 07, 1934  L TKA on 01-27-19 DCP:  Home with spouse.  1 story home with 1 ste. DME:  No needs.  Has a RW and 3-in-1. PT: [facility].  PT eval scheduled on 01-30-19.                   DME Arranged:  N/A DME Agency:  NA  HH Arranged:  NA HH Agency:  NA  Additional Comments: Please contact me with any questions of if this plan should need to change.  Marianne Sofia, RN,CCM [facility]  [phone] 01/27/2019, 2:59 PM

## 2019-01-27 NOTE — Op Note (Signed)
NAME:  Sheena Taylor                      MEDICAL RECORD NO.:  [id number]                             FACILITY:  [facility]      PHYSICIAN:  Pietro Cassis. Alvan Dame, M.D.  DATE OF BIRTH:  March 25, 1935      DATE OF PROCEDURE:  01/27/2019                                     OPERATIVE REPORT         PREOPERATIVE DIAGNOSIS:  Left knee osteoarthritis.      POSTOPERATIVE DIAGNOSIS:  Left knee osteoarthritis.      FINDINGS:  The patient was noted to have complete loss of cartilage and   bone-on-bone arthritis with associated osteophytes in the medial and patellofemoral compartments of   the knee with a significant synovitis and associated effusion.  The patient had failed months of conservative treatment including medications, injection therapy, activity modification.     PROCEDURE:  Left total knee replacement.      COMPONENTS USED:  DePuy Attune rotating platform posterior stabilized knee   system, a size 6 femur, 6 tibia, size 6 mm PS AOX insert, and 38 anatomic patellar   button.      SURGEON:  Pietro Cassis. Alvan Dame, M.D.      ASSISTANT:  Danae Orleans, PA-C.      ANESTHESIA:  Regional and Spinal.      SPECIMENS:  None.      COMPLICATION:  None.      DRAINS:  None.  EBL: <100cc      TOURNIQUET TIME:   Total Tourniquet Time Documented: Thigh (Left) - 24 minutes Total: Thigh (Left) - 24 minutes        The patient was stable to the recovery room.      INDICATION FOR PROCEDURE:  Sheena Taylor is a 84 y.o. female patient of   mine.  The patient had been seen, evaluated, and treated for months conservatively in the   office with medication, activity modification, and injections.  The patient had   radiographic changes of bone-on-bone arthritis with endplate sclerosis and osteophytes noted.  Based on the radiographic changes and failed conservative measures, the patient   decided to proceed with definitive treatment, total knee replacement.  Risks of infection, DVT, component  failure, need for revision surgery, neurovascular injury were reviewed in the office setting.  The postop course was reviewed stressing the efforts to maximize post-operative satisfaction and function.  Consent was obtained for benefit of pain   relief.      PROCEDURE IN DETAIL:  The patient was brought to the operative theater.   Once adequate anesthesia, preoperative antibiotics, 2 gm of Ancef,1 gm of Tranexamic Acid, and 10 mg of Decadron administered, the patient was positioned supine with a left thigh tourniquet placed.  The  left lower extremity was prepped and draped in sterile fashion.  A time-   out was performed identifying the patient, planned procedure, and the appropriate extremity.      The left lower extremity was placed in the [facility] leg holder.  The leg was   exsanguinated, tourniquet elevated to 250 mmHg.  A midline incision was   made  followed by median parapatellar arthrotomy.  Following initial   exposure, attention was first directed to the patella.  Precut   measurement was noted to be 23 mm.  I resected down to 14 mm and used a   38 anatomic patellar button to restore patellar height as well as cover the cut surface.      The lug holes were drilled and a metal shim was placed to protect the   patella from retractors and saw blade during the procedure.      At this point, attention was now directed to the femur.  The femoral   canal was opened with a drill, irrigated to try to prevent fat emboli.  An   intramedullary rod was passed at 3 degrees valgus, 9 mm of bone was   resected off the distal femur.  Following this resection, the tibia was   subluxated anteriorly.  Using the extramedullary guide, 2 mm of bone was resected off   the proximal medial tibia.  We confirmed the gap would be   stable medially and laterally with a size 5 spacer block as well as confirmed that the tibial cut was perpendicular in the coronal plane, checking with an alignment rod.      Once  this was done, I sized the femur to be a size 6 in the anterior-   posterior dimension, chose a standard component based on medial and   lateral dimension.  The size 6 rotation block was then pinned in   position anterior referenced using the C-clamp to set rotation.  The   anterior, posterior, and  chamfer cuts were made without difficulty nor   notching making certain that I was along the anterior cortex to help   with flexion gap stability.      The final box cut was made off the lateral aspect of distal femur.      At this point, the tibia was sized to be a size 6.  The size 6 tray was   then pinned in position through the medial third of the tubercle,   drilled, and keel punched.  Trial reduction was now carried with a 6 femur,  6 tibia, a size 6 mm PS insert, and the 38 anatomic patella botton.  The knee was brought to full extension with good flexion stability with the patella   tracking through the trochlea without application of pressure.  Given   all these findings the trial components removed.  Final components were   opened and cement was mixed.  The knee was irrigated with normal saline solution and pulse lavage.  The synovial lining was   then injected with 30 cc of 0.25% Marcaine with epinephrine, 1 cc of Toradol and 30 cc of NS for a total of 61 cc.     Final implants were then cemented onto cleaned and dried cut surfaces of bone with the knee brought to extension with a size 6 mm PS trial insert.      Once the cement had fully cured, excess cement was removed   throughout the knee.  I confirmed that I was satisfied with the range of   motion and stability, and the final size 6 mm PS AOX insert was chosen.  It was   placed into the knee.      The tourniquet had been let down at 24 minutes.  No significant   hemostasis was required.  The extensor mechanism was then reapproximated using #1 Vicryl and #  1 Stratafix sutures with the knee   in flexion.  The   remaining wound  was closed with 2-0 Vicryl and running 4-0 Monocryl.   The knee was cleaned, dried, dressed sterilely using Dermabond and   Aquacel dressing.  The patient was then   brought to recovery room in stable condition, tolerating the procedure   well.   Please note that Physician Assistant, Danae Orleans, PA-C was present for the entirety of the case, and was utilized for pre-operative positioning, peri-operative retractor management, general facilitation of the procedure and for primary wound closure at the end of the case.              Pietro Cassis Alvan Dame, M.D.    01/27/2019 11:17 AM

## 2019-01-27 NOTE — Anesthesia Procedure Notes (Signed)
Spinal  Start time: 01/27/2019 10:05 AM End time: 01/27/2019 10:09 AM Staffing Resident/CRNA: Gean Maidens, CRNA Performed: resident/CRNA  Preanesthetic Checklist Completed: patient identified, site marked, surgical consent, pre-op evaluation, timeout performed, IV checked, risks and benefits discussed and monitors and equipment checked Spinal Block Patient position: sitting Prep: DuraPrep Patient monitoring: heart rate, continuous pulse ox and blood pressure Approach: midline Location: L3-4 Injection technique: single-shot Needle Needle type: Pencan  Needle gauge: 24 G Needle length: 9 cm Needle insertion depth: 7 cm Additional Notes Pt sitting position sterile prep and drape, negative paresthesia/heme

## 2019-01-27 NOTE — Plan of Care (Signed)
Plan of care 

## 2019-01-27 NOTE — Interval H&P Note (Signed)
History and Physical Interval Note:  01/27/2019 8:48 AM  Sheena Taylor Height  has presented today for surgery, with the diagnosis of Left knee osteoarthritis.  The various methods of treatment have been discussed with the patient and family. After consideration of risks, benefits and other options for treatment, the patient has consented to  Procedure(s) with comments: TOTAL KNEE ARTHROPLASTY (Left) - 70 mins as a surgical intervention.  The patient's history has been reviewed, patient examined, no change in status, stable for surgery.  I have reviewed the patient's chart and labs.  Questions were answered to the patient's satisfaction.     Mauri Pole

## 2019-01-27 NOTE — Progress Notes (Signed)
Assisted Dr. Moser with left, ultrasound guided, adductor canal block. Side rails up, monitors on throughout procedure. See vital signs in flow sheet. Tolerated Procedure well.  

## 2019-01-27 NOTE — Evaluation (Signed)
Physical Therapy Evaluation Patient Details Name: Sheena Taylor MRN: [id number] DOB: May 26, 1934 Today's Date: 01/27/2019   History of Present Illness  Pt is 84 y.o. female s/p Lt TKA on 01/27/19 with PMH significant for HTN, A-Fib, OA, and past Rt TKA.  Clinical Impression  Sheena Taylor is a 84 y.o. female POD 0 s/p Lt TKA. Patient reports modified independence with RW for mobility at baseline. Patient is now limited by functional impairments (see PT problem list below) and requires min assist for transfers and gait with RW. Patient was able to ambulate ~80 feet with RW and minimal cues for safe use. Patient instructed in exercise to facilitate ROM and circulation to manage edema. Patient will benefit from continued skilled PT interventions to address impairments and progress towards PLOF. Acute PT will follow to progress mobility and stair training in preparation for safe discharge home.     Follow Up Recommendations Follow surgeon's recommendation for DC plan and follow-up therapies    Equipment Recommendations  None recommended by PT    Recommendations for Other Services       Precautions / Restrictions Precautions Precautions: Fall Restrictions Weight Bearing Restrictions: No      Mobility  Bed Mobility Overal bed mobility: Needs Assistance Bed Mobility: Supine to Sit     Supine to sit: HOB elevated;Min guard     General bed mobility comments: cues for sequencing and use of bed rails, no physical assist required  Transfers Overall transfer level: Needs assistance Equipment used: Rolling walker (2 wheeled) Transfers: Sit to/from Stand Sit to Stand: Min assist         General transfer comment: cues for safe hand palcement and technique, min assist to initiate power up and steady with rising  Ambulation/Gait Ambulation/Gait assistance: Min assist Gait Distance (Feet): 80 Feet Assistive device: Rolling walker (2 wheeled) Gait Pattern/deviations:  Step-through pattern;Decreased step length - left;Decreased stride length;Decreased stance time - left;Decreased step length - right;Decreased weight shift to left Gait velocity: decreased   General Gait Details: pt with good safety awareness with RW and maintatined safe hand placement and proximity with min cues, no overt LOB noted  Stairs            Wheelchair Mobility    Modified Rankin (Stroke Patients Only)       Balance Overall balance assessment: Needs assistance Sitting-balance support: No upper extremity supported;Feet supported Sitting balance-Leahy Scale: Good     Standing balance support: During functional activity;Bilateral upper extremity supported Standing balance-Leahy Scale: Poor                               Pertinent Vitals/Pain Pain Assessment: Faces Faces Pain Scale: Hurts little more(pt reporting pain meds she recieved helped) Pain Location: Lt Knee Pain Descriptors / Indicators: Aching;Grimacing;Sore;Guarding Pain Intervention(s): Limited activity within patient's tolerance;Monitored during session;Repositioned;Ice applied    Home Living Family/patient expects to be discharged to:: Private residence Living Arrangements: Spouse/significant other Available Help at Discharge: Family Type of Home: House Home Access: Stairs to enter Entrance Stairs-Rails: Can reach both;Right;Left Entrance Stairs-Number of Steps: 1 Home Layout: Laundry or work area in basement(pt's husband goes down for [employer]) Home Equipment: Bedside commode;Walker - 2 wheels;Cane - single point;Grab bars - tub/shower      Prior Function Level of Independence: Independent with assistive device(s)         Comments: pt mod I with RW for gait prior to surgery  Hand Dominance   Dominant Hand: Right    Extremity/Trunk Assessment   Upper Extremity Assessment Upper Extremity Assessment: Generalized weakness    Lower Extremity Assessment Lower Extremity  Assessment: Generalized weakness;LLE deficits/detail LLE Deficits / Details: pt with good quad activation in supine and 3+/5 or better for leg extension LLE Sensation: WNL LLE Coordination: WNL       Communication   Communication: No difficulties  Cognition Arousal/Alertness: Awake/alert Behavior During Therapy: WFL for tasks assessed/performed Overall Cognitive Status: Within Functional Limits for tasks assessed                                        General Comments      Exercises Total Joint Exercises Ankle Circles/Pumps: AROM;10 reps;Seated;Both Quad Sets: AROM;10 reps;Supine;Left Heel Slides: AROM;Supine;Left;5 reps   Assessment/Plan    PT Assessment Patient needs continued PT services  PT Problem List Decreased strength;Decreased balance;Decreased range of motion;Decreased mobility;Decreased activity tolerance;Pain       PT Treatment Interventions DME instruction;Functional mobility training;Balance training;Patient/family education;Modalities;Therapeutic activities;Gait training;Stair training;Therapeutic exercise    PT Goals (Current goals can be found in the Care Plan section)  Acute Rehab PT Goals Patient Stated Goal: go home and improve mobility and independence PT Goal Formulation: With patient Time For Goal Achievement: 02/03/19 Potential to Achieve Goals: Good    Frequency 7X/week   Barriers to discharge        Co-evaluation               AM-PAC PT "6 Clicks" Mobility  Outcome Measure Help needed turning from your back to your side while in a flat bed without using bedrails?: A Little Help needed moving from lying on your back to sitting on the side of a flat bed without using bedrails?: A Little Help needed moving to and from a bed to a chair (including a wheelchair)?: A Little Help needed standing up from a chair using your arms (e.g., wheelchair or bedside chair)?: A Little Help needed to walk in hospital room?: A  Little Help needed climbing 3-5 steps with a railing? : A Lot 6 Click Score: 17    End of Session Equipment Utilized During Treatment: Gait belt Activity Tolerance: Patient tolerated treatment well Patient left: in chair;with call bell/phone within reach;with family/visitor present;with chair alarm set Nurse Communication: Mobility status PT Visit Diagnosis: Other abnormalities of gait and mobility (R26.89);Muscle weakness (generalized) (M62.81);Difficulty in walking, not elsewhere classified (R26.2);Pain Pain - Right/Left: Left Pain - part of body: Knee    Time: 1636-1700 PT Time Calculation (min) (ACUTE ONLY): 24 min   Charges:   PT Evaluation $PT Eval Low Complexity: 1 Low PT Treatments $Therapeutic Exercise: 8-22 mins       Kipp Brood, PT, DPT Physical Therapist with [facility]  01/27/2019 7:33 PM

## 2019-01-27 NOTE — Discharge Instructions (Addendum)
INSTRUCTIONS AFTER JOINT REPLACEMENT  ° °o Remove items at home which could result in a fall. This includes throw rugs or furniture in walking pathways °o ICE to the affected joint every three hours while awake for 30 minutes at a time, for at least the first 3-5 days, and then as needed for pain and swelling.  Continue to use ice for pain and swelling. You may notice swelling that will progress down to the foot and ankle.  This is normal after surgery.  Elevate your leg when you are not up walking on it.   °o Continue to use the breathing machine you got in the hospital (incentive spirometer) which will help keep your temperature down.  It is common for your temperature to cycle up and down following surgery, especially at night when you are not up moving around and exerting yourself.  The breathing machine keeps your lungs expanded and your temperature down. ° ° °DIET:  As you were doing prior to hospitalization, we recommend a well-balanced diet. ° °DRESSING / WOUND CARE / SHOWERING ° °Keep the surgical dressing until follow up.  The dressing is water proof, so you can shower without any extra covering.  IF THE DRESSING FALLS OFF or the wound gets wet inside, change the dressing with sterile gauze.  Please use good hand washing techniques before changing the dressing.  Do not use any lotions or creams on the incision until instructed by your surgeon.   ° °ACTIVITY ° °o Increase activity slowly as tolerated, but follow the weight bearing instructions below.   °o No driving for 6 weeks or until further direction given by your physician.  You cannot drive while taking narcotics.  °o No lifting or carrying greater than 10 lbs. until further directed by your surgeon. °o Avoid periods of inactivity such as sitting longer than an hour when not asleep. This helps prevent blood clots.  °o You may return to work once you are authorized by your doctor.  ° ° ° °WEIGHT BEARING  ° °Weight bearing as tolerated with assist  device (walker, cane, etc) as directed, use it as long as suggested by your surgeon or therapist, typically at least 4-6 weeks. ° ° °EXERCISES ° °Results after joint replacement surgery are often greatly improved when you follow the exercise, range of motion and muscle strengthening exercises prescribed by your doctor. Safety measures are also important to protect the joint from further injury. Any time any of these exercises cause you to have increased pain or swelling, decrease what you are doing until you are comfortable again and then slowly increase them. If you have problems or questions, call your caregiver or physical therapist for advice.  ° °Rehabilitation is important following a joint replacement. After just a few days of immobilization, the muscles of the leg can become weakened and shrink (atrophy).  These exercises are designed to build up the tone and strength of the thigh and leg muscles and to improve motion. Often times heat used for twenty to thirty minutes before working out will loosen up your tissues and help with improving the range of motion but do not use heat for the first two weeks following surgery (sometimes heat can increase post-operative swelling).  ° °These exercises can be done on a training (exercise) mat, on the floor, on a table or on a bed. Use whatever works the best and is most comfortable for you.    Use music or television while you are exercising so that   the exercises are a pleasant break in your day. This will make your life better with the exercises acting as a break in your routine that you can look forward to.   Perform all exercises about fifteen times, three times per day or as directed.  You should exercise both the operative leg and the other leg as well. ° °Exercises include: °  °• Quad Sets - Tighten up the muscle on the front of the thigh (Quad) and hold for 5-10 seconds.   °• Straight Leg Raises - With your knee straight (if you were given a brace, keep it on),  lift the leg to 60 degrees, hold for 3 seconds, and slowly lower the leg.  Perform this exercise against resistance later as your leg gets stronger.  °• Leg Slides: Lying on your back, slowly slide your foot toward your buttocks, bending your knee up off the floor (only go as far as is comfortable). Then slowly slide your foot back down until your leg is flat on the floor again.  °• Angel Wings: Lying on your back spread your legs to the side as far apart as you can without causing discomfort.  °• Hamstring Strength:  Lying on your back, push your heel against the floor with your leg straight by tightening up the muscles of your buttocks.  Repeat, but this time bend your knee to a comfortable angle, and push your heel against the floor.  You may put a pillow under the heel to make it more comfortable if necessary.  ° °A rehabilitation program following joint replacement surgery can speed recovery and prevent re-injury in the future due to weakened muscles. Contact your doctor or a physical therapist for more information on knee rehabilitation.  ° ° °CONSTIPATION ° °Constipation is defined medically as fewer than three stools per week and severe constipation as less than one stool per week.  Even if you have a regular bowel pattern at home, your normal regimen is likely to be disrupted due to multiple reasons following surgery.  Combination of anesthesia, postoperative narcotics, change in appetite and fluid intake all can affect your bowels.  ° °YOU MUST use at least one of the following options; they are listed in order of increasing strength to get the job done.  They are all available over the counter, and you may need to use some, POSSIBLY even all of these options:   ° °Drink plenty of fluids (prune juice may be helpful) and high fiber foods °Colace 100 mg by mouth twice a day  °Senokot for constipation as directed and as needed Dulcolax (bisacodyl), take with full glass of water  °Miralax (polyethylene glycol)  once or twice a day as needed. ° °If you have tried all these things and are unable to have a bowel movement in the first 3-4 days after surgery call either your surgeon or your primary doctor.   ° °If you experience loose stools or diarrhea, hold the medications until you stool forms back up.  If your symptoms do not get better within 1 week or if they get worse, check with your doctor.  If you experience "the worst abdominal pain ever" or develop nausea or vomiting, please contact the office immediately for further recommendations for treatment. ° ° °ITCHING:  If you experience itching with your medications, try taking only a single pain pill, or even half a pain pill at a time.  You can also use Benadryl over the counter for itching or also to   help with sleep.   TED HOSE STOCKINGS:  Use stockings on both legs until for at least 2 weeks or as directed by physician office. They may be removed at night for sleeping.  MEDICATIONS:  See your medication summary on the After Visit Summary that nursing will review with you.  You may have some home medications which will be placed on hold until you complete the course of blood thinner medication.  It is important for you to complete the blood thinner medication as prescribed.  PRECAUTIONS:  If you experience chest pain or shortness of breath - call 911 immediately for transfer to the hospital emergency department.   If you develop a fever greater that 101 F, purulent drainage from wound, increased redness or drainage from wound, foul odor from the wound/dressing, or calf pain - CONTACT YOUR SURGEON.                                                   FOLLOW-UP APPOINTMENTS:  If you do not already have a post-op appointment, please call the office for an appointment to be seen by your surgeon.  Guidelines for how soon to be seen are listed in your After Visit Summary, but are typically between 1-4 weeks after surgery.  OTHER INSTRUCTIONS:   Knee  Replacement:  Do not place pillow under knee, focus on keeping the knee straight while resting.   MAKE SURE YOU:   Understand these instructions.   Get help right away if you are not doing well or get worse.   Information on my medicine - ELIQUIS (apixaban)  This medication education was reviewed with me or my healthcare representative as part of my discharge preparation.  The student pharmacist that spoke with me during my hospital stay was:  Bobbye Riggs, Student-PharmD  Why was Eliquis prescribed for you? Eliquis was prescribed for you to reduce the risk of forming blood clots that can cause a stroke if you have a medical condition called atrial fibrillation (a type of irregular heartbeat) OR to reduce the risk of a blood clots forming after orthopedic surgery.  What do You need to know about Eliquis ? Take your Eliquis TWICE DAILY - one tablet in the morning and one tablet in the evening with or without food.  It would be best to take the doses about the same time each day.  If you have difficulty swallowing the tablet whole please discuss with your pharmacist how to take the medication safely.  Take Eliquis exactly as prescribed by your doctor and DO NOT stop taking Eliquis without talking to the doctor who prescribed the medication.  Stopping may increase your risk of developing a new clot or stroke.  Refill your prescription before you run out.  After discharge, you should have regular check-up appointments with your healthcare provider that is prescribing your Eliquis.  In the future your dose may need to be changed if your kidney function or weight changes by a significant amount or as you get older.  What do you do if you miss a dose? If you miss a dose, take it as soon as you remember on the same day and resume taking twice daily.  Do not take more than one dose of ELIQUIS at the same time.  Important Safety Information A possible side effect of Eliquis is bleeding. You  should call your healthcare provider right away if you experience any of the following: ? Bleeding from an injury or your nose that does not stop. ? Unusual colored urine (red or dark brown) or unusual colored stools (red or black). ? Unusual bruising for unknown reasons. ? A serious fall or if you hit your head (even if there is no bleeding).  Some medicines may interact with Eliquis and might increase your risk of bleeding or clotting while on Eliquis. To help avoid this, consult your healthcare provider or pharmacist prior to using any new prescription or non-prescription medications, including herbals, vitamins, non-steroidal anti-inflammatory drugs (NSAIDs) and supplements.  This website has more information on Eliquis (apixaban): [email or web address].   Thank you for letting us be a part of your medical care team.  It is a privilege we respect greatly.  We hope these instructions will help you stay on track for a fast and full recovery!

## 2019-01-28 ENCOUNTER — Encounter (HOSPITAL_COMMUNITY): Payer: Self-pay | Admitting: Orthopedic Surgery

## 2019-01-28 DIAGNOSIS — M25762 Osteophyte, left knee: Secondary | ICD-10-CM | POA: Diagnosis not present

## 2019-01-28 DIAGNOSIS — Z96651 Presence of right artificial knee joint: Secondary | ICD-10-CM | POA: Diagnosis not present

## 2019-01-28 DIAGNOSIS — Z89421 Acquired absence of other right toe(s): Secondary | ICD-10-CM | POA: Diagnosis not present

## 2019-01-28 DIAGNOSIS — M1712 Unilateral primary osteoarthritis, left knee: Secondary | ICD-10-CM | POA: Diagnosis not present

## 2019-01-28 DIAGNOSIS — M659 Synovitis and tenosynovitis, unspecified: Secondary | ICD-10-CM | POA: Diagnosis not present

## 2019-01-28 DIAGNOSIS — M25462 Effusion, left knee: Secondary | ICD-10-CM | POA: Diagnosis not present

## 2019-01-28 LAB — BASIC METABOLIC PANEL
Anion gap: 9 (ref 5–15)
BUN: 19 mg/dL (ref 8–23)
CO2: 23 mmol/L (ref 22–32)
Calcium: 8.5 mg/dL — ABNORMAL LOW (ref 8.9–10.3)
Chloride: 105 mmol/L (ref 98–111)
Creatinine, Ser: 0.77 mg/dL (ref 0.44–1.00)
GFR calc Af Amer: >60 mL/min (ref >60)
GFR calc non Af Amer: >60 mL/min (ref >60)
Glucose, Bld: 133 mg/dL — ABNORMAL HIGH (ref 70–99)
Potassium: 3.5 mmol/L (ref 3.5–5.1)
Sodium: 137 mmol/L (ref 135–145)

## 2019-01-28 LAB — CBC
HCT: 31.9 % — ABNORMAL LOW (ref 36.0–46.0)
Hemoglobin: 10.2 g/dL — ABNORMAL LOW (ref 12.0–15.0)
MCH: 31.8 pg (ref 26.0–34.0)
MCHC: 32 g/dL (ref 30.0–36.0)
MCV: 99.4 fL (ref 80.0–100.0)
Platelets: 233 10*3/uL (ref 150–400)
RBC: 3.21 MIL/uL — ABNORMAL LOW (ref 3.87–5.11)
RDW: 12 % (ref 11.5–15.5)
WBC: 13.8 10*3/uL — ABNORMAL HIGH (ref 4.0–10.5)
nRBC: 0 % (ref 0.0–0.2)

## 2019-01-28 MED ORDER — ACETAMINOPHEN 500 MG PO TABS: 1000.0000 mg | ORAL_TABLET | Freq: Three times a day (TID) | ORAL | 0 refills | Status: DC

## 2019-01-28 MED ORDER — POLYETHYLENE GLYCOL 3350 17 G PO PACK: 17.0000 g | PACK | Freq: Two times a day (BID) | ORAL | 0 refills | Status: DC

## 2019-01-28 MED ORDER — METHOCARBAMOL 500 MG PO TABS: 500.0000 mg | ORAL_TABLET | Freq: Four times a day (QID) | ORAL | 0 refills | Status: DC | PRN

## 2019-01-28 MED ORDER — HYDROMORPHONE HCL 2 MG PO TABS: 2.0000 mg | ORAL_TABLET | ORAL | 0 refills | Status: DC | PRN

## 2019-01-28 MED ORDER — DOCUSATE SODIUM 100 MG PO CAPS: 100.0000 mg | ORAL_CAPSULE | Freq: Two times a day (BID) | ORAL | 0 refills | Status: DC

## 2019-01-28 MED ORDER — FERROUS SULFATE 325 (65 FE) MG PO TABS: 325.0000 mg | ORAL_TABLET | Freq: Three times a day (TID) | ORAL | 0 refills | Status: DC

## 2019-01-28 NOTE — Progress Notes (Signed)
Physical Therapy Treatment Patient Details Name: ANISHIA BEAULIEU MRN: [id number] DOB: Jan 10, 1935 Today's Date: 01/28/2019    History of Present Illness Pt is 84 y.o. female s/p Lt TKA on 01/27/19 with PMH significant for HTN, A-Fib, OA, and past Rt TKA.    PT Comments    POD #1 Attempted to perform and educate on TKR TE's following HEP however unable to complete due to increased pain.  Pain meds requested and ICE applied.  Pt will need another PT session to complete HEP education and practice one step she has to enter home.    Follow Up Recommendations  Follow surgeon's recommendation for DC plan and follow-up therapies;Outpatient PT     Equipment Recommendations  None recommended by PT(has from prior)    Recommendations for Other Services       Precautions / Restrictions Precautions Precautions: Fall Precaution Comments: instructed no pillow under knee Restrictions Weight Bearing Restrictions: No       Balance                                            Cognition Arousal/Alertness: Awake/alert Behavior During Therapy: WFL for tasks assessed/performed Overall Cognitive Status: Within Functional Limits for tasks assessed                                 General Comments: pt had prior TKR, is a retired Therapist, sports and very knowledgable      Exercises   Total Knee Replacement TE's 10 reps B LE ankle pumps 10 reps towel squeezes 10 reps knee presses 10 reps heel slides   Followed by ICE     General Comments        Pertinent Vitals/Pain Pain Assessment: 0-10 Pain Score: 8  Pain Location: Left knee Pain Descriptors / Indicators: Aching;Grimacing;Sore;Guarding Pain Intervention(s): Monitored during session;Repositioned;Ice applied;Patient requesting pain meds-RN notified    Home Living                      Prior Function            PT Goals (current goals can now be found in the care plan section) Progress towards PT  goals: Progressing toward goals    Frequency    7X/week      PT Plan Current plan remains appropriate    Co-evaluation              AM-PAC PT "6 Clicks" Mobility   Outcome Measure  Help needed turning from your back to your side while in a flat bed without using bedrails?: A Little Help needed moving from lying on your back to sitting on the side of a flat bed without using bedrails?: A Little Help needed moving to and from a bed to a chair (including a wheelchair)?: A Little Help needed standing up from a chair using your arms (e.g., wheelchair or bedside chair)?: A Little Help needed to walk in hospital room?: A Little Help needed climbing 3-5 steps with a railing? : A Lot 6 Click Score: 17    End of Session Equipment Utilized During Treatment: Gait belt Activity Tolerance: Patient limited by pain Patient left: in chair;with call bell/phone within reach;with family/visitor present;with chair alarm set Nurse Communication: Patient requests pain meds PT Visit Diagnosis: Other abnormalities of gait and mobility (  R26.89);Muscle weakness (generalized) (M62.81);Difficulty in walking, not elsewhere classified (R26.2);Pain Pain - Right/Left: Left Pain - part of body: Knee     Time: [id number] PT Time Calculation (min) (ACUTE ONLY): 14 min  Charges:   $Therapeutic Exercise: 8-22 mins                      Rica Koyanagi  PTA Acute  Rehabilitation Services  Pager      [phone] Office      [phone]

## 2019-01-28 NOTE — Progress Notes (Signed)
Physical Therapy Treatment Patient Details Name: Sheena Taylor MRN: [id number] DOB: [birth year] Today's Date: 01/28/2019    History of Present Illness Pt is 84 y.o. female s/p Lt TKA on 01/27/19 with PMH significant for HTN, A-Fib, OA, and past Rt TKA.    PT Comments    POD # 1 am session Assisted OOB.  General bed mobility comments: demonstarated and instructed how to use a belt loop to self assist LE.  General transfer comment: <25% VC's on proper hand placement and safety with turns.  Also assisted with toilet transfer.   General Gait Details: decreased amb distance due to increased c/o pain and fatigue.  <25% VC's on safety with turns and proper upright posture.  Assisted back to recliner and performed some TE's.   Limited by increased pain.  Meds requested and ICE applied.   Follow Up Recommendations  Follow surgeon's recommendation for DC plan and follow-up therapies;Outpatient PT     Equipment Recommendations  None recommended by PT(has from prior)    Recommendations for Other Services       Precautions / Restrictions Precautions Precautions: Fall Precaution Comments: instructed no pillow under knee Restrictions Weight Bearing Restrictions: No    Mobility  Bed Mobility Overal bed mobility: Needs Assistance Bed Mobility: Supine to Sit     Supine to sit: Supervision     General bed mobility comments: demonstarated and instructed how to use a belt loop to self assist LE  Transfers Overall transfer level: Needs assistance Equipment used: Rolling walker (2 wheeled) Transfers: Sit to/from Stand Sit to Stand: Supervision;Min guard         General transfer comment: <25% VC's on proper hand placement and safety with turns  Ambulation/Gait Ambulation/Gait assistance: Min guard Gait Distance (Feet): 45 Feet Assistive device: Rolling walker (2 wheeled) Gait Pattern/deviations: Step-through pattern;Decreased step length - left;Decreased stride length;Decreased  stance time - left;Decreased step length - right;Decreased weight shift to left Gait velocity: decreased   General Gait Details: decreased amb distance due to increased c/o pain and fatigue.  <25% VC's on safety with turns and proper upright posture.   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        Pertinent Vitals/Pain Pain Assessment: 0-10 Pain Score: 8  Pain Location: Left knee Pain Descriptors / Indicators: Aching;Grimacing;Sore;Guarding Pain Intervention(s): Monitored during session;Repositioned;Ice applied;Patient requesting pain meds-RN notified    Home Living                      Prior Function            PT Goals (current goals can now be found in the care plan  section) Progress towards PT goals: Progressing toward goals    Frequency    7X/week      PT Plan Current plan remains appropriate    Co-evaluation              AM-PAC PT "6 Clicks" Mobility   Outcome Measure  Help needed turning from your back to your side while in a flat bed without using bedrails?: A Little Help needed moving from lying on your back to sitting on the side of a flat bed without using bedrails?: A Little Help needed moving to and from a bed to a chair (including a wheelchair)?: A Little Help needed standing up from a chair using your arms (e.g., wheelchair or bedside chair)?: A Little Help needed to walk in hospital room?: A Little Help needed climbing 3-5 steps with a railing? : A Lot 6 Click Score: 17     End of Session Equipment Utilized During Treatment: Gait belt Activity Tolerance: Patient limited by pain Patient left: in chair;with call bell/phone within reach;with family/visitor present;with chair alarm set Nurse Communication: Patient requests pain meds PT Visit Diagnosis: Other abnormalities of gait and mobility (R26.89);Muscle weakness (generalized) (M62.81);Difficulty in walking, not elsewhere classified (R26.2);Pain Pain - Right/Left: Left Pain - part of body: Knee     Time: [id number] PT Time Calculation (min) (ACUTE ONLY): 28 min  Charges:  $Gait Training: 8-22 mins $Therapeutic Activity: 8-22 mins                     Rica Koyanagi  PTA Acute  Rehabilitation Services Pager      [phone] Office      [phone]

## 2019-01-28 NOTE — Plan of Care (Signed)
done

## 2019-01-28 NOTE — Progress Notes (Signed)
Physical Therapy Treatment Patient Details Name: Sheena Taylor MRN: [id number] DOB: 03-15-1935 Today's Date: 01/28/2019    History of Present Illness Pt is 84 y.o. female s/p Lt TKA on 01/27/19 with PMH significant for HTN, A-Fib, OA, and past Rt TKA.    PT Comments    POD # 1 pm session Spouse was present.  Assisted with transfers, amb and one step.  General transfer comment: <25% VC's on proper hand placement and safety with turns with spouse present for "hands on" education.  General Gait Details: tolerated an increased distance with spouse present to "hands on" education on safe handling.  Then returned to room to perform some TE's following HEP handout.  Instructed on proper tech, freq as well as use of ICE.   Addressed all mobility questions, discussed appropriate activity, educated on use of ICE.  Pt ready for D/C to home.   Follow Up Recommendations  Follow surgeon's recommendation for DC plan and follow-up therapies;Outpatient PT     Equipment Recommendations  None recommended by PT(has from prior)    Recommendations for Other Services       Precautions / Restrictions Precautions Precautions: Fall Precaution Comments: instructed no pillow under knee Restrictions Weight Bearing Restrictions: No    Mobility  Bed Mobility Overal bed mobility: Needs Assistance Bed Mobility: Supine to Sit     Supine to sit: Supervision     General bed mobility comments: OOB in recliner  Transfers Overall transfer level: Needs assistance Equipment used: Rolling walker (2 wheeled) Transfers: Sit to/from Stand Sit to Stand: Supervision;Min guard         General transfer comment: <25% VC's on proper hand placement and safety with turns with spouse present for "hands on" education  Ambulation/Gait Ambulation/Gait assistance: Min guard Gait Distance (Feet): 75 Feet Assistive device: Rolling walker (2 wheeled) Gait Pattern/deviations: Step-through pattern;Decreased step  length - left;Decreased stride length;Decreased stance time - left;Decreased step length - right;Decreased weight shift to left Gait velocity: decreased   General Gait Details: tolerated an increased distance with spouse present to "hands on" education on safe handling   Stairs Stairs: Yes Stairs assistance: Min guard;Supervision Stair Management: No rails;Step to pattern;Forwards;With walker Number of Stairs: 1 General stair comments: with spouse present for "hands on" education practiced one step twice   Wheelchair Mobility    Modified Rankin (Stroke Patients Only)       Balance                                            Cognition Arousal/Alertness: Awake/alert Behavior During Therapy: WFL for tasks assessed/performed Overall Cognitive Status: Within Functional Limits for tasks assessed                                 General Comments: pt had prior TKR, is a retired Therapist, sports and very knowledgable      Exercises   Total Knee Replacement TE's 5 reps B LE ankle pumps 5 reps towel squeezes 5 reps knee presses 5 reps heel slides  5 reps SAQ's 5 reps SLR's 3 reps all seated TE's Followed by ICE     General Comments        Pertinent Vitals/Pain Pain Assessment: 0-10 Pain Score: 8  Pain Location: Left knee Pain Descriptors / Indicators: Aching;Grimacing;Sore;Guarding Pain Intervention(s): Monitored during  session;Repositioned;Ice applied;Patient requesting pain meds-RN notified    Home Living                      Prior Function            PT Goals (current goals can now be found in the care plan section) Progress towards PT goals: Progressing toward goals    Frequency    7X/week      PT Plan Current plan remains appropriate    Co-evaluation              AM-PAC PT "6 Clicks" Mobility   Outcome Measure  Help needed turning from your back to your side while in a flat bed without using bedrails?: A  Little Help needed moving from lying on your back to sitting on the side of a flat bed without using bedrails?: A Little Help needed moving to and from a bed to a chair (including a wheelchair)?: A Little Help needed standing up from a chair using your arms (e.g., wheelchair or bedside chair)?: A Little Help needed to walk in hospital room?: A Little Help needed climbing 3-5 steps with a railing? : A Lot 6 Click Score: 17    End of Session Equipment Utilized During Treatment: Gait belt Activity Tolerance: Patient limited by pain Patient left: in chair;with call bell/phone within reach;with family/visitor present;with chair alarm set Nurse Communication: Patient requests pain meds PT Visit Diagnosis: Other abnormalities of gait and mobility (R26.89);Muscle weakness (generalized) (M62.81);Difficulty in walking, not elsewhere classified (R26.2);Pain Pain - Right/Left: Left Pain - part of body: Knee     Time: [id number] PT Time Calculation (min) (ACUTE ONLY): 30 min  Charges:  $Gait Training: 8-22 mins $Therapeutic Exercise: 8-22 mins                      Rica Koyanagi  PTA Acute  Rehabilitation Services Pager      [phone] Office      [phone]

## 2019-01-28 NOTE — Progress Notes (Signed)
     Subjective: 1 Day Post-Op Procedure(s) (LRB): TOTAL KNEE ARTHROPLASTY (Left)   Patient reports pain as mild, pain controlled.  No reported events throughout the night.  The procedure, findings and expectations moving forward were reviewed.  Questions encouraged and answered.  Patient feels that she is doing well and ready to get home.  Patient is ready to be discharged home, if she does well with therapy.  Patient will follow-up in the clinic in 2 weeks.  Patient knows to call with any questions or concerns.   Patient's anticipated LOS is less than 2 midnights, meeting these requirements: - Lives within 1 hour of care - Has a competent adult at home to recover with post-op recover - NO history of  - Chronic pain requiring opiods  - Diabetes  - Coronary Artery Disease  - Heart failure  - Heart attack  - Stroke  - DVT/VTE  - Respiratory Failure/COPD  - Renal failure  - Anemia  - Advanced Liver disease    Objective:   VITALS:   Vitals:   01/28/19 0141 01/28/19 0516  BP: (!) 132/59 118/60  Pulse: 65 68  Resp: 16 16  Temp: 97.9 F (36.6 C) 98.2 F (36.8 C)  SpO2: 97% 98%    Dorsiflexion/Plantar flexion intact Incision: dressing C/D/I No cellulitis present Compartment soft  LABS Recent Labs    01/28/19 0250  HGB 10.2*  HCT 31.9*  WBC 13.8*  PLT 233    Recent Labs    01/28/19 0250  NA 137  K 3.5  BUN 19  CREATININE 0.77  GLUCOSE 133*     Assessment/Plan: 1 Day Post-Op Procedure(s) (LRB): TOTAL KNEE ARTHROPLASTY (Left) Foley cath DC'd Advance diet Up with therapy D/C IV fluids Discharge home Follow up in 2 weeks at [facility] Follow up with OLIN,Iara Monds D in 2 weeks.  Contact information:  [facility] [address], [address] [place] [id number]    Overweight (BMI 25-29.9) Estimated body mass index is 26.79 kg/m as calculated from the following:   Height as of this encounter: 5\' 4"  (1.626 m).   Weight  as of this encounter: 70.8 kg. Patient also counseled that weight may inhibit the healing process Patient counseled that losing weight will help with future health issues         West Pugh. Benjamin Merrihew   PAC  01/28/2019, 9:48 AM

## 2019-01-29 NOTE — Discharge Summary (Signed)
Physician Discharge Summary  Patient ID: Sheena Taylor MRN: [id number] DOB/AGE: 82-Mar-1936 82 y.o.  Admit date: 01/27/2019 Discharge date: 01/28/2019   Procedures:  Procedure(s) (LRB): TOTAL KNEE ARTHROPLASTY (Left)  Attending Physician:  Dr. Paralee Cancel   Admission Diagnoses:   [place] Janene Harvey  Discharge Diagnoses:  Principal Problem:   S/P left TKA  Past Medical History:  Diagnosis Date   Arthritis    Atrial fibrillation ([place])    Complication of anesthesia    Dr. 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   History of laparotomy    for ectopic pregnancy   Hypertension    Macular degeneration    Pneumonia    as a child   S/P bunionectomy    bilateral and hammer toe-correction    HPI:    Sheena, Taylor 84 y.o.female,has a history of pain and functional disability in the leftknee due to arthritisand has failed non-surgical conservative treatments for greater than 12 weeks to includeNSAID's and/or analgesics, corticosteriod injections and activity modification. Onset of symptoms was gradual,starting1+years ago withgradually worseningcourse since that time.The patient notedprior procedures on the knee to includearthroplastyon the rightknee(s).Patient currently rates pain in theleftknee(s)at 9out of 10 with activity. Patient hasnight pain, worsening of pain with activity and weight bearing, pain that interferes with activities of daily living, pain with passive range of motion, crepitus and joint swelling. Patient has evidence of periarticular osteophytes and joint space narrowingby imaging studies. There is no active infection.Risks, benefits and expectations were discussed with the patient. Risks including but not  limited to the risk of anesthesia, blood clots, nerve damage, blood vessel damage, failure of the prosthesis, infection and up to and including death. Patient understand the risks, benefits and expectations and wishes to proceed with surgery.   PCP: [name], MD   Discharged Condition: good  Hospital Course:  Patient underwent the above stated procedure on 01/27/2019. Patient tolerated the procedure well and brought to the recovery room in good condition and subsequently to the floor.  POD #1 BP: 118/60 ; Pulse: 68 ; Temp: 98.2 F (36.8 C) ; Resp: 16 Patient reports pain as mild, pain controlled.  No reported events throughout the night.  The procedure, findings and expectations moving forward were reviewed.  Questions encouraged and answered.  Patient feels that she is doing well and ready to get home.  Patient is ready to be discharged home. Dorsiflexion/plantar flexion intact, incision: dressing C/D/I, no cellulitis present and compartment soft.   LABS  Basename    HGB     10.2  HCT     31.9    Discharge Exam: General appearance: alert, cooperative and no distress Extremities: Homans sign is negative, no sign of DVT, no edema, redness or tenderness in the calves or thighs and no ulcers, gangrene or trophic changes  Disposition:  Home with follow up in 2 weeks   Follow-up Information    Paralee Cancel, MD. Schedule an appointment as soon as possible for a visit in 2 weeks.   Specialty: Orthopedic Surgery Contact information: [address] [place] [address] [id number]        Rosilyn Mings.. Go on 01/30/2019.   Why: You are scheduled for a physical  therapy appointment on 01-30-19 at 1:45 pm with Olin Pia Minor at [facility], [address]. Contact information: [address] [place] [address] [phone]           Discharge Instructions    Call MD / Call 911   Complete by: As directed    If you experience chest  pain or shortness of breath, CALL 911 and be transported to the hospital emergency room.  If you develope a fever above 101 F, pus (white drainage) or increased drainage or redness at the wound, or calf pain, call your surgeon's office.   Change dressing   Complete by: As directed    Maintain surgical dressing until follow up in the clinic. If the edges start to pull up, may reinforce with tape. If the dressing is no longer working, may remove and cover with gauze and tape, but must keep the area dry and clean.  Call with any questions or concerns.   Constipation Prevention   Complete by: As directed    Drink plenty of fluids.  Prune juice may be helpful.  You may use a stool softener, such as Colace (over the counter) 100 mg twice a day.  Use MiraLax (over the counter) for constipation as needed.   Diet - low sodium heart healthy   Complete by: As directed    Discharge instructions   Complete by: As directed    Maintain surgical dressing until follow up in the clinic. If the edges start to pull up, may reinforce with tape. If the dressing is no longer working, may remove and cover with gauze and tape, but must keep the area dry and clean.  Follow up in 2 weeks at [facility]. Call with any questions or concerns.   Increase activity slowly as tolerated   Complete by: As directed    Weight bearing as tolerated with assist device (walker, cane, etc) as directed, use it as long as suggested by your surgeon or therapist, typically at least 4-6 weeks.   TED hose   Complete by: As directed    Use stockings (TED hose) for 2 weeks on both leg(s).  You may remove them at night for sleeping.      Allergies as of 01/28/2019      Reactions   Codeine Nausea And Vomiting      Medication List    STOP taking these medications   traMADol 50 MG tablet Commonly known as: ULTRAM     TAKE these medications   acetaminophen 500 MG tablet Commonly known as: TYLENOL Take 2 tablets (1,000 mg  total) by mouth every 8 (eight) hours.   apixaban 5 MG Tabs tablet Commonly known as: Eliquis Take 1 tablet (5 mg total) by mouth 2 (two) times daily.   docusate sodium 100 MG capsule Commonly known as: Colace Take 1 capsule (100 mg total) by mouth 2 (two) times daily.   ferrous sulfate 325 (65 FE) MG tablet Commonly known as: FerrouSul Take 1 tablet (325 mg total) by mouth 3 (three) times daily with meals for 14 days.   hydrochlorothiazide 25 MG tablet Commonly known as: HYDRODIURIL Take 25 mg by mouth daily.   HYDROmorphone 2 MG tablet Commonly known as: Dilaudid Take 1-2 tablets (2-4 mg total) by mouth every 4 (four) hours as needed for severe pain.   lisinopril 20 MG tablet Commonly known as: ZESTRIL Take 1 tablet (20 mg total) by mouth daily.   Lutein 6 MG Tabs Take  6 mg by mouth 2 (two) times daily.   methocarbamol 500 MG tablet Commonly known as: Robaxin Take 1 tablet (500 mg total) by mouth every 6 (six) hours as needed for muscle spasms.   OCUVITE PRESERVISION PO Take 1 tablet by mouth 2 (two) times daily.   polyethylene glycol 17 g packet Commonly known as: MIRALAX / GLYCOLAX Take 17 g by mouth 2 (two) times daily.   pravastatin 20 MG tablet Commonly known as: PRAVACHOL Take 20 mg by mouth daily.   vitamin C 500 MG tablet Commonly known as: ASCORBIC ACID Take 500 mg by mouth daily after breakfast.            Discharge Care Instructions  (From admission, onward)         Start     Ordered   01/28/19 0000  Change dressing    Comments: Maintain surgical dressing until follow up in the clinic. If the edges start to pull up, may reinforce with tape. If the dressing is no longer working, may remove and cover with gauze and tape, but must keep the area dry and clean.  Call with any questions or concerns.   01/28/19 [id number]           Signed: West Pugh. Katielynn Horan   PA-C  01/29/2019, 11:02 AM

## 2019-01-30 DIAGNOSIS — R739 Hyperglycemia, unspecified: Secondary | ICD-10-CM | POA: Diagnosis not present

## 2019-01-30 DIAGNOSIS — R946 Abnormal results of thyroid function studies: Secondary | ICD-10-CM | POA: Diagnosis not present

## 2019-01-30 DIAGNOSIS — M810 Age-related osteoporosis without current pathological fracture: Secondary | ICD-10-CM | POA: Diagnosis not present

## 2019-01-30 DIAGNOSIS — I129 Hypertensive chronic kidney disease with stage 1 through stage 4 chronic kidney disease, or unspecified chronic kidney disease: Secondary | ICD-10-CM | POA: Diagnosis not present

## 2019-01-30 DIAGNOSIS — D692 Other nonthrombocytopenic purpura: Secondary | ICD-10-CM | POA: Diagnosis not present

## 2019-01-30 DIAGNOSIS — F419 Anxiety disorder, unspecified: Secondary | ICD-10-CM | POA: Diagnosis not present

## 2019-01-30 DIAGNOSIS — F325 Major depressive disorder, single episode, in full remission: Secondary | ICD-10-CM | POA: Diagnosis not present

## 2019-01-30 DIAGNOSIS — I4891 Unspecified atrial fibrillation: Secondary | ICD-10-CM | POA: Diagnosis not present

## 2019-01-30 DIAGNOSIS — Z0000 Encounter for general adult medical examination without abnormal findings: Secondary | ICD-10-CM | POA: Diagnosis not present

## 2019-01-30 DIAGNOSIS — N182 Chronic kidney disease, stage 2 (mild): Secondary | ICD-10-CM | POA: Diagnosis not present

## 2019-01-30 DIAGNOSIS — I77819 Aortic ectasia, unspecified site: Secondary | ICD-10-CM | POA: Diagnosis not present

## 2019-01-30 DIAGNOSIS — R809 Proteinuria, unspecified: Secondary | ICD-10-CM | POA: Diagnosis not present

## 2019-01-30 DIAGNOSIS — Z1331 Encounter for screening for depression: Secondary | ICD-10-CM | POA: Diagnosis not present

## 2019-02-02 DIAGNOSIS — M25562 Pain in left knee: Secondary | ICD-10-CM | POA: Diagnosis not present

## 2019-02-04 DIAGNOSIS — Z96652 Presence of left artificial knee joint: Secondary | ICD-10-CM | POA: Diagnosis not present

## 2019-02-04 DIAGNOSIS — Z471 Aftercare following joint replacement surgery: Secondary | ICD-10-CM | POA: Diagnosis not present

## 2019-02-05 ENCOUNTER — Encounter (HOSPITAL_COMMUNITY): Payer: Self-pay | Admitting: Orthopedic Surgery

## 2019-02-05 NOTE — Anesthesia Postprocedure Evaluation (Signed)
Anesthesia Post Note  Patient: Sheena Taylor  Procedure(s) Performed: TOTAL KNEE ARTHROPLASTY (Left Knee)     Patient location during evaluation: PACU Anesthesia Type: Regional, MAC and Spinal Level of consciousness: awake and alert Pain management: pain level controlled Vital Signs Assessment: post-procedure vital signs reviewed and stable Respiratory status: spontaneous breathing, nonlabored ventilation, respiratory function stable and patient connected to nasal cannula oxygen Cardiovascular status: stable and blood pressure returned to baseline Postop Assessment: no apparent nausea or vomiting and spinal receding Anesthetic complications: no    Last Vitals:  Vitals:   01/28/19 0516 01/28/19 1029  BP: 118/60 (!) 143/48  Pulse: 68 73  Resp: 16 17  Temp: 36.8 C 36.7 C  SpO2: 98% 97%    Last Pain:  Vitals:   01/28/19 1353  TempSrc:   PainSc: 6                  Stefhanie Kachmar

## 2019-02-05 NOTE — Addendum Note (Signed)
Addendum  created 02/05/19 1357 by Oleta Mouse, MD   Child order released for a procedure order, Clinical Note Signed, Intraprocedure Blocks edited, Order Canceled from Note

## 2019-02-05 NOTE — Anesthesia Procedure Notes (Signed)
Anesthesia Regional Block: Adductor canal block   Pre-Anesthetic Checklist: ,, timeout performed, Correct Patient, Correct Site, Correct Laterality, Correct Procedure, Correct Position, site marked, Risks and benefits discussed,  Surgical consent,  Pre-op evaluation,  At surgeon's request and post-op pain management  Laterality: Left and Lower  Prep: chloraprep       Needles:  Injection technique: Single-shot     Needle Length: 9cm  Needle Gauge: 22     Additional Needles: Arrow StimuQuik ECHO Echogenic Stimulating PNB Needle  Procedures:,,,, ultrasound used (permanent image in chart),,,,  Narrative:  Start time: 01/27/2019 8:22 AM End time: 01/27/2019 8:27 AM Injection made incrementally with aspirations every 5 mL.  Performed by: Personally  Anesthesiologist: Oleta Mouse, MD

## 2019-02-06 DIAGNOSIS — M25562 Pain in left knee: Secondary | ICD-10-CM | POA: Diagnosis not present

## 2019-02-09 DIAGNOSIS — M25562 Pain in left knee: Secondary | ICD-10-CM | POA: Diagnosis not present

## 2019-02-11 DIAGNOSIS — M25562 Pain in left knee: Secondary | ICD-10-CM | POA: Diagnosis not present

## 2019-02-13 DIAGNOSIS — M25562 Pain in left knee: Secondary | ICD-10-CM | POA: Diagnosis not present

## 2019-02-17 DIAGNOSIS — M25562 Pain in left knee: Secondary | ICD-10-CM | POA: Diagnosis not present

## 2019-02-20 DIAGNOSIS — Z96651 Presence of right artificial knee joint: Secondary | ICD-10-CM | POA: Diagnosis not present

## 2019-02-23 DIAGNOSIS — M25562 Pain in left knee: Secondary | ICD-10-CM | POA: Diagnosis not present

## 2019-02-23 DIAGNOSIS — Z96651 Presence of right artificial knee joint: Secondary | ICD-10-CM | POA: Diagnosis not present

## 2019-02-27 DIAGNOSIS — M25562 Pain in left knee: Secondary | ICD-10-CM | POA: Diagnosis not present

## 2019-02-27 DIAGNOSIS — Z96651 Presence of right artificial knee joint: Secondary | ICD-10-CM | POA: Diagnosis not present

## 2019-03-04 DIAGNOSIS — M25562 Pain in left knee: Secondary | ICD-10-CM | POA: Diagnosis not present

## 2019-03-04 DIAGNOSIS — Z96651 Presence of right artificial knee joint: Secondary | ICD-10-CM | POA: Diagnosis not present

## 2019-03-05 ENCOUNTER — Ambulatory Visit
Admission: RE | Admit: 2019-03-05 | Discharge: 2019-03-05 | Disposition: A | Discharge status: Home / Self Care | Payer: Medicare Other | Source: Ambulatory Visit | Attending: Internal Medicine | Admitting: Internal Medicine

## 2019-03-05 ENCOUNTER — Other Ambulatory Visit: Payer: Self-pay

## 2019-03-05 DIAGNOSIS — Z1231 Encounter for screening mammogram for malignant neoplasm of breast: Secondary | ICD-10-CM

## 2019-03-09 DIAGNOSIS — Z471 Aftercare following joint replacement surgery: Secondary | ICD-10-CM | POA: Diagnosis not present

## 2019-03-09 DIAGNOSIS — Z96652 Presence of left artificial knee joint: Secondary | ICD-10-CM | POA: Diagnosis not present

## 2019-03-09 DIAGNOSIS — M25562 Pain in left knee: Secondary | ICD-10-CM | POA: Diagnosis not present

## 2019-03-09 DIAGNOSIS — Z96651 Presence of right artificial knee joint: Secondary | ICD-10-CM | POA: Diagnosis not present

## 2019-03-11 DIAGNOSIS — Z96651 Presence of right artificial knee joint: Secondary | ICD-10-CM | POA: Diagnosis not present

## 2019-03-11 DIAGNOSIS — M25562 Pain in left knee: Secondary | ICD-10-CM | POA: Diagnosis not present

## 2019-03-16 DIAGNOSIS — M25562 Pain in left knee: Secondary | ICD-10-CM | POA: Diagnosis not present

## 2019-03-16 DIAGNOSIS — Z96651 Presence of right artificial knee joint: Secondary | ICD-10-CM | POA: Diagnosis not present

## 2019-03-19 DIAGNOSIS — Z96651 Presence of right artificial knee joint: Secondary | ICD-10-CM | POA: Diagnosis not present

## 2019-03-19 DIAGNOSIS — M25562 Pain in left knee: Secondary | ICD-10-CM | POA: Diagnosis not present

## 2019-04-02 ENCOUNTER — Encounter: Payer: Self-pay | Admitting: Cardiology

## 2019-04-02 ENCOUNTER — Other Ambulatory Visit: Payer: Self-pay

## 2019-04-02 ENCOUNTER — Ambulatory Visit (INDEPENDENT_AMBULATORY_CARE_PROVIDER_SITE_OTHER): Payer: Medicare Other | Admitting: Cardiology

## 2019-04-02 VITALS — BP 141/87 | HR 95 | Temp 98.2°F | Ht 64.0 in | Wt 151.0 lb

## 2019-04-02 DIAGNOSIS — I10 Essential (primary) hypertension: Secondary | ICD-10-CM

## 2019-04-02 DIAGNOSIS — I480 Paroxysmal atrial fibrillation: Secondary | ICD-10-CM

## 2019-04-02 NOTE — Progress Notes (Signed)
Patient referred by [name], MD for preop risk stratification  Subjective:   Sheena Taylor, female    DOB: January 13, 1935, 84 y.o.   MRN: [id number]   Chief Complaint  Patient presents with  . Atrial Fibrillation  . Follow-up    HPI  84 y.o. Caucasian female with hypertension, arthritis, paroxysmal atrial fibrillation.  Echocardiogram showed normal EF, grade 1 DD, mild to moderate MR, mild TR. Blood pressure is better controlled. She underwent left knee arthroplasty, and has recovered well. She is now very active, walks several times a day without any symptoms of chest pain, shortness of breath, palpitations, leg edema, orthopnea, PND, TIA/syncope. She is upset because eliquis is prohibitively expensive.     Past Medical History:  Diagnosis Date  . Arthritis   . Atrial fibrillation ([place])   . Complication of anesthesia    Dr. Winfred Leeds had problems putting spinal in approximately 10-15 years ago and had to have general   . Depression   . Dysrhythmia    a fib  . H/O vein stripping    bilateral  . 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 History of laparotomy    for ectopic pregnancy  . Hypertension   . Macular degeneration   . Pneumonia    as a child  . S/P bunionectomy    bilateral and hammer toe-correction     Past Surgical History:  Procedure Laterality Date  . ABDOMINAL HYSTERECTOMY     partial  . BREAST EXCISIONAL BIOPSY Left   . BREAST EXCISIONAL BIOPSY Right   . BUNIONECTOMY     bilateral with hammer toe and amputation 2nd right toe with this  . LAPAROTOMY     for ruptured ectopic pregnancy  . TOTAL KNEE ARTHROPLASTY Right 01/17/2015   Procedure: TOTAL RIGHT KNEE ARTHROPLASTY;  Surgeon: Paralee Cancel, MD;  Location: [facility];  Service: Orthopedics;  Laterality: Right;  . TOTAL KNEE ARTHROPLASTY Left 01/27/2019   Procedure: TOTAL KNEE ARTHROPLASTY;  Surgeon: Paralee Cancel, MD;  Location: [facility];  Service: Orthopedics;   Laterality: Left;  70 mins  . VARICOSE VEIN SURGERY     bilateral     Social History   Socioeconomic History  . Marital status: Married    Spouse name: Not on file  . Number of children: 2  . Years of education: Not on file  . Highest education level: Not on file  Occupational History  . Not on file  Tobacco Use  . Smoking status: Light Tobacco Smoker    Types: Cigarettes  . Smokeless tobacco: Never Used  . Tobacco comment: smokes about once a week when having a glass of wine  Substance and Sexual Activity  . Alcohol use: Yes    Comment: occassionally  wine  . Drug use: No  . Sexual activity: Not Currently  Other Topics Concern  . Not on file  Social History Narrative  . Not on file   Social Determinants of Health   Financial Resource Strain:   . Difficulty of Paying Living Expenses: Not on file  Food Insecurity:   . Worried About Charity fundraiser in the Last Year: Not on file  . Ran Out of Food in the Last Year: Not on file  Transportation Needs:   . Lack of Transportation (Medical): Not on file  . Lack of Transportation (Non-Medical): Not on file  Physical Activity:   . Days of Exercise per  Week: Not on file  . Minutes of Exercise per Session: Not on file  Stress:   . Feeling of Stress : Not on file  Social Connections:   . Frequency of Communication with Friends and Family: Not on file  . Frequency of Social Gatherings with Friends and Family: Not on file  . Attends Religious Services: Not on file  . Active Member of Clubs or Organizations: Not on file  . Attends Archivist Meetings: Not on file  . Marital Status: Not on file  Intimate Partner Violence:   . Fear of Current or Ex-Partner: Not on file  . Emotionally Abused: Not on file  . Physically Abused: Not on file  . Sexually Abused: Not on file     Family History  Problem Relation Age of Onset  . Heart disease Mother   . Breast cancer Neg Hx      Current Outpatient Medications on  File Prior to Visit  Medication Sig Dispense Refill  . acetaminophen (TYLENOL) 500 MG tablet Take 2 tablets (1,000 mg total) by mouth every 8 (eight) hours. 30 tablet 0  . apixaban (ELIQUIS) 5 MG TABS tablet Take 1 tablet (5 mg total) by mouth 2 (two) times daily. 180 tablet 2  . celecoxib (CELEBREX) 200 MG capsule Take 200 mg by mouth daily.    . ferrous sulfate (FERROUSUL) 325 (65 FE) MG tablet Take 1 tablet (325 mg total) by mouth 3 (three) times daily with meals for 14 days. 42 tablet 0  . hydrochlorothiazide (HYDRODIURIL) 25 MG tablet Take 25 mg by mouth daily.    Marland Kitchen lisinopril (ZESTRIL) 20 MG tablet Take 1 tablet (20 mg total) by mouth daily. 90 tablet 2  . Lutein 6 MG TABS Take 6 mg by mouth 2 (two) times daily.     . Multiple Vitamins-Minerals (OCUVITE PRESERVISION PO) Take 1 tablet by mouth 2 (two) times daily.    . pravastatin (PRAVACHOL) 20 MG tablet Take 20 mg by mouth daily.    . vitamin C (ASCORBIC ACID) 500 MG tablet Take 500 mg by mouth daily after breakfast.    . mirtazapine (REMERON) 15 MG tablet Take 15 mg by mouth as needed.    . traMADol (ULTRAM) 50 MG tablet Take 50 mg by mouth 3 times/day as needed-between meals & bedtime.     No current facility-administered medications on file prior to visit.    Cardiovascular studies:  EKG 04/02/2019: Sinus rhythm 87 bpm. Left atrial enlargement. Frequent PAC s  Left anterior fascicular block.   Echocardiogram 12/17/2018:  1. The left ventricle has hyperdynamic systolic function, with an ejection fraction of >65%. The cavity size was normal. Left ventricular diastolic Doppler parameters are consistent with impaired relaxation. No evidence of left ventricular regional wall  motion abnormalities.  2. The right ventricle has normal systolic function. The cavity was normal. There is no increase in right ventricular wall thickness.  3. Mild to moderate mitral regurgitation.  4. Mild tricuspid regurgitation. PASP 23 mmhg.  EKG  12/16/2018: Sinus rhythm 70 bpm. Borderline left atrial enlargement. Compared to an earlier EKG from today, afib now longer present now.  EKG 12/16/2018: Afib w/RVR 137 bpm.  Nonspecific ST-T changes  Recent labs: Results for Sheena Taylor, Sheena Taylor (MRN [id number]) as of 12/16/2018 14:40  Ref. Range 12/05/2018 10:05  Sodium Latest Ref Range: 135 - 145 mmol/L 144  Potassium Latest Ref Range: 3.5 - 5.1 mmol/L 4.7  Chloride Latest Ref Range: 98 - 111 mmol/L 107  CO2 Latest Ref Range: 22 - 32 mmol/L 26  Glucose Latest Ref Range: 70 - 99 mg/dL 99  BUN Latest Ref Range: 8 - 23 mg/dL 12  Creatinine Latest Ref Range: 0.44 - 1.00 mg/dL 0.61  Calcium Latest Ref Range: 8.9 - 10.3 mg/dL 9.5  Anion gap Latest Ref Range: 5 - 15  11  GFR, Est Non African American Latest Ref Range: >60 mL/min >60  GFR, Est African American Latest Ref Range: >60 mL/min >60   Results for Sheena Taylor, Sheena Taylor (MRN [id number]) as of 12/16/2018 14:40  Ref. Range 12/05/2018 10:05  WBC Latest Ref Range: 4.0 - 10.5 K/uL 6.9  RBC Latest Ref Range: 3.87 - 5.11 MIL/uL 4.03  Hemoglobin Latest Ref Range: 12.0 - 15.0 g/dL 12.8  HCT Latest Ref Range: 36.0 - 46.0 % 40.2  MCV Latest Ref Range: 80.0 - 100.0 fL 99.8  MCH Latest Ref Range: 26.0 - 34.0 pg 31.8  MCHC Latest Ref Range: 30.0 - 36.0 g/dL 31.8  RDW Latest Ref Range: 11.5 - 15.5 % 13.0  Platelets Latest Ref Range: 150 - 400 K/uL 272  nRBC Latest Ref Range: 0.0 - 0.2 % 0.0    Review of Systems  Constitution: Negative for decreased appetite, malaise/fatigue, weight gain and weight loss.  HENT: Negative for congestion.   Eyes: Negative for visual disturbance.  Cardiovascular: Negative for chest pain, dyspnea on exertion, leg swelling, palpitations and syncope.  Respiratory: Negative for cough.   Endocrine: Negative for cold intolerance.  Hematologic/Lymphatic: Does not bruise/bleed easily.  Skin: Negative for itching and rash.  Musculoskeletal: Negative for joint pain and  myalgias.  Gastrointestinal: Negative for abdominal pain, nausea and vomiting.  Genitourinary: Negative for dysuria.  Neurological: Negative for dizziness and weakness.  Psychiatric/Behavioral: The patient is not nervous/anxious.   All other systems reviewed and are negative.        Vitals:   04/02/19 1040  BP: (!) 141/87  Pulse: 95  Temp: 98.2 F (36.8 C)  SpO2: 97%      Objective:   Physical Exam  Constitutional: She is oriented to person, place, and time. She appears well-developed and well-nourished. No distress.  HENT:  Head: Normocephalic and atraumatic.  Eyes: Pupils are equal, round, and reactive to light. Conjunctivae are normal.  Neck: No JVD present.  Cardiovascular: Normal rate, regular rhythm and intact distal pulses.  Murmur heard.  Harsh midsystolic murmur is present with a grade of 2/6 at the upper right sternal border radiating to the neck. Varicosities both feet  Pulmonary/Chest: Effort normal and breath sounds normal. She has no wheezes. She has no rales.  Abdominal: Soft. Bowel sounds are normal. There is no rebound.  Musculoskeletal:        General: No edema.  Lymphadenopathy:    She has no cervical adenopathy.  Neurological: She is alert and oriented to person, place, and time. No cranial nerve deficit.  Skin: Skin is warm and dry.  Psychiatric: She has a normal mood and affect.  Nursing note and vitals reviewed.          Assessment & Recommendations:   84 y.o. Caucasian female with hypertension, arthritis, paroxysmal atrial fibrillation.  Paroxysmal atrial fibrillation: In sinus rhythm today. Low suspicion for ischemia.  CHA2DS2VASc score 4, annual stroke risk 5%. Continue starting eliquis 5 mg bid.  Will seek patient assistance through manufacturer. If it does not work, will consider Xarelto/pradaxa/warfarin in that order.   Hypertension: Better controled.  F/u in 6 months  Sheena Taylor  Esther Hardy, MD [facility].  [place] Pager: [phone] Office: [phone] If no answer Cell [phone]

## 2019-05-10 ENCOUNTER — Ambulatory Visit: Payer: Medicare Other | Attending: Internal Medicine

## 2019-05-10 DIAGNOSIS — Z23 Encounter for immunization: Secondary | ICD-10-CM

## 2019-05-10 NOTE — Progress Notes (Signed)
   Covid-19 Vaccination Clinic  Name:  Sheena Taylor    MRN: [id number] DOB: November 19, 1934  05/10/2019  Sheena Taylor was observed post Covid-19 immunization for 15 minutes without incidence. She was provided with Vaccine Information Sheet and instruction to access the V-Safe system.   Sheena Taylor was instructed to call 911 with any severe reactions post vaccine: Marland Kitchen Difficulty breathing  . Swelling of your face and throat  . A fast heartbeat  . A bad rash all over your body  . Dizziness and weakness    Immunizations Administered    Name Date Dose VIS Date Route   Pfizer COVID-19 Vaccine 05/10/2019 11:56 AM 0.3 mL 03/27/2019 Intramuscular   Manufacturer: [place]   Lot: [id number]   [place]: [id number]

## 2019-06-01 ENCOUNTER — Ambulatory Visit: Payer: Medicare Other | Attending: Internal Medicine

## 2019-06-01 DIAGNOSIS — Z23 Encounter for immunization: Secondary | ICD-10-CM | POA: Insufficient documentation

## 2019-06-01 NOTE — Progress Notes (Signed)
   Covid-19 Vaccination Clinic  Name:  Sheena Taylor    MRN: [id number] DOB: October 22, 1934  06/01/2019  [name] was observed post Covid-19 immunization for 15 minutes without incidence. She was provided with Vaccine Information Sheet and instruction to access the V-Safe system.   [name] was instructed to call 911 with any severe reactions post vaccine: Marland Kitchen Difficulty breathing  . Swelling of your face and throat  . A fast heartbeat  . A bad rash all over your body  . Dizziness and weakness    Immunizations Administered    Name Date Dose VIS Date Route   Pfizer COVID-19 Vaccine 06/01/2019 10:56 AM 0.3 mL 03/27/2019 Intramuscular   Manufacturer: [place]   Lot: [id number]   [place]: [id number]

## 2019-07-28 DIAGNOSIS — S61219A Laceration without foreign body of unspecified finger without damage to nail, initial encounter: Secondary | ICD-10-CM | POA: Diagnosis not present

## 2019-07-28 DIAGNOSIS — W19XXXA Unspecified fall, initial encounter: Secondary | ICD-10-CM | POA: Diagnosis not present

## 2019-07-28 DIAGNOSIS — I4891 Unspecified atrial fibrillation: Secondary | ICD-10-CM | POA: Diagnosis not present

## 2019-07-28 DIAGNOSIS — R0781 Pleurodynia: Secondary | ICD-10-CM | POA: Diagnosis not present

## 2019-08-04 ENCOUNTER — Other Ambulatory Visit: Payer: Self-pay | Admitting: Cardiology

## 2019-08-04 DIAGNOSIS — I10 Essential (primary) hypertension: Secondary | ICD-10-CM

## 2019-08-11 DIAGNOSIS — I77819 Aortic ectasia, unspecified site: Secondary | ICD-10-CM | POA: Diagnosis not present

## 2019-08-11 DIAGNOSIS — S61219A Laceration without foreign body of unspecified finger without damage to nail, initial encounter: Secondary | ICD-10-CM | POA: Diagnosis not present

## 2019-08-11 DIAGNOSIS — D692 Other nonthrombocytopenic purpura: Secondary | ICD-10-CM | POA: Diagnosis not present

## 2019-08-11 DIAGNOSIS — I4891 Unspecified atrial fibrillation: Secondary | ICD-10-CM | POA: Diagnosis not present

## 2019-08-11 DIAGNOSIS — R946 Abnormal results of thyroid function studies: Secondary | ICD-10-CM | POA: Diagnosis not present

## 2019-08-11 DIAGNOSIS — F325 Major depressive disorder, single episode, in full remission: Secondary | ICD-10-CM | POA: Diagnosis not present

## 2019-08-11 DIAGNOSIS — M1990 Unspecified osteoarthritis, unspecified site: Secondary | ICD-10-CM | POA: Diagnosis not present

## 2019-08-11 DIAGNOSIS — R0781 Pleurodynia: Secondary | ICD-10-CM | POA: Diagnosis not present

## 2019-08-11 DIAGNOSIS — N182 Chronic kidney disease, stage 2 (mild): Secondary | ICD-10-CM | POA: Diagnosis not present

## 2019-08-11 DIAGNOSIS — E663 Overweight: Secondary | ICD-10-CM | POA: Diagnosis not present

## 2019-08-11 DIAGNOSIS — H3530 Unspecified macular degeneration: Secondary | ICD-10-CM | POA: Diagnosis not present

## 2019-08-11 DIAGNOSIS — I129 Hypertensive chronic kidney disease with stage 1 through stage 4 chronic kidney disease, or unspecified chronic kidney disease: Secondary | ICD-10-CM | POA: Diagnosis not present

## 2019-10-01 ENCOUNTER — Other Ambulatory Visit: Payer: Self-pay

## 2019-10-01 ENCOUNTER — Ambulatory Visit: Payer: Medicare Other | Admitting: Cardiology

## 2019-10-01 ENCOUNTER — Encounter: Payer: Self-pay | Admitting: Cardiology

## 2019-10-01 VITALS — BP 142/71 | HR 68 | Resp 17 | Ht 64.0 in | Wt 150.0 lb

## 2019-10-01 DIAGNOSIS — I10 Essential (primary) hypertension: Secondary | ICD-10-CM

## 2019-10-01 DIAGNOSIS — I480 Paroxysmal atrial fibrillation: Secondary | ICD-10-CM

## 2019-10-01 DIAGNOSIS — R0789 Other chest pain: Secondary | ICD-10-CM | POA: Diagnosis not present

## 2019-10-01 NOTE — Progress Notes (Signed)
Patient referred by [name], MD for preop risk stratification  Subjective:   Sheena Taylor, female    DOB: [birth year], 85 y.o.   MRN: [id number]   Chief Complaint  Patient presents with  . Atrial Fibrillation  . Follow-up    6 month    HPI  85 y.o. Caucasian female with hypertension, arthritis, paroxysmal atrial fibrillation.  In the last few weeks, she has experienced retrosternal sensation of "something stuck", worse after drinking liquids. It is not worse with exertion. She feels tired and fatigued after yard work, but does not have any particular exertional dyspnea.    Current Outpatient Medications on File Prior to Visit  Medication Sig Dispense Refill  . acetaminophen (TYLENOL) 500 MG tablet Take 2 tablets (1,000 mg total) by mouth every 8 (eight) hours. 30 tablet 0  . apixaban (ELIQUIS) 5 MG TABS tablet Take 1 tablet (5 mg total) by mouth 2 (two) times daily. 180 tablet 2  . hydrochlorothiazide (HYDRODIURIL) 25 MG tablet Take 25 mg by mouth daily.    Marland Kitchen lisinopril (ZESTRIL) 20 MG tablet TAKE 1 TABLET BY MOUTH EVERY DAY 90 tablet 2  . mirtazapine (REMERON) 15 MG tablet Take 15 mg by mouth as needed.    . Multiple Vitamins-Minerals (MULTIVITAMIN WITH MINERALS) tablet Take 1 tablet by mouth daily.    . Multiple Vitamins-Minerals (OCUVITE PRESERVISION PO) Take 1 tablet by mouth 2 (two) times daily.    . pravastatin (PRAVACHOL) 20 MG tablet Take 20 mg by mouth daily.    . vitamin C (ASCORBIC ACID) 500 MG tablet Take 500 mg by mouth daily after breakfast.     No current facility-administered medications on file prior to visit.    Cardiovascular studies:  EKG 10/01/2019: Sinus rhythm 76 bpm Occasional PAC    Possible old anteroseptal infarct   Echocardiogram 12/17/2018:  1. The left ventricle has hyperdynamic systolic function, with an ejection fraction of >65%. The cavity size was normal. Left ventricular diastolic Doppler parameters are consistent with impaired  relaxation. No evidence of left ventricular regional wall  motion abnormalities.  2. The right ventricle has normal systolic function. The cavity was normal. There is no increase in right ventricular wall thickness.  3. Mild to moderate mitral regurgitation.  4. Mild tricuspid regurgitation. PASP 23 mmhg.  EKG 12/16/2018: Afib w/RVR 137 bpm.  Nonspecific ST-T changes  Recent labs: 01/28/2019: Glucose 133, BUN/Cr 19/0.77. EGFR >60. Na/K 137/3.5.  H/H 10.2/31.9. MCV 99. Platelets 233    Review of Systems  Cardiovascular: Positive for chest pain. Negative for dyspnea on exertion, leg swelling, palpitations and syncope.        Vitals:   10/01/19 1127  BP: (!) 142/71  Pulse: 68  Resp: 17  SpO2: 96%      Objective:   Physical Exam Vitals reviewed.  Constitutional:      General: She is not in acute distress.    Appearance: She is well-developed.  Neck:     Vascular: No JVD.  Cardiovascular:     Rate and Rhythm: Normal rate and regular rhythm.     Pulses: Intact distal pulses.     Heart sounds: Murmur heard.  Harsh midsystolic murmur is present with a grade of 2/6 at the upper right sternal border radiating to the neck.      Comments: Varicosities both feet Pulmonary:     Effort: Pulmonary effort is normal.     Breath sounds: Normal breath sounds. No wheezing or rales.  Assessment & Recommendations:   85 y.o. Caucasian female with hypertension, arthritis, paroxysmal atrial fibrillation.  Chest pain: Atypical, but has risk factors for CAD. Will obtain exercise nuclear stress test If normal, consider GI workup, in light of anemia and family h/o stomach cancer  Paroxysmal atrial fibrillation: In sinus rhythm today. Low suspicion for ischemia.  CHA2DS2VASc score 4, annual stroke risk 5%. Continue starting eliquis 5 mg bid.   Hypertension: Fairly well controled.  F/u in 6 months, unless significant abnormality found on stress test  Nigel Mormon, MD [facility]. [place] Pager: [phone] Office: [phone] If no answer Cell [phone]

## 2019-10-20 ENCOUNTER — Other Ambulatory Visit: Payer: Self-pay | Admitting: Cardiology

## 2019-10-20 DIAGNOSIS — R0789 Other chest pain: Secondary | ICD-10-CM

## 2019-10-26 ENCOUNTER — Other Ambulatory Visit: Payer: Self-pay

## 2019-10-26 ENCOUNTER — Ambulatory Visit: Payer: Medicare Other

## 2019-10-28 ENCOUNTER — Other Ambulatory Visit: Payer: Self-pay | Admitting: Cardiology

## 2019-10-28 DIAGNOSIS — I480 Paroxysmal atrial fibrillation: Secondary | ICD-10-CM

## 2020-01-12 DIAGNOSIS — Z471 Aftercare following joint replacement surgery: Secondary | ICD-10-CM | POA: Diagnosis not present

## 2020-01-12 DIAGNOSIS — Z96653 Presence of artificial knee joint, bilateral: Secondary | ICD-10-CM | POA: Diagnosis not present

## 2020-01-15 DIAGNOSIS — Z23 Encounter for immunization: Secondary | ICD-10-CM | POA: Diagnosis not present

## 2020-01-25 ENCOUNTER — Other Ambulatory Visit: Payer: Self-pay | Admitting: Internal Medicine

## 2020-01-25 DIAGNOSIS — M810 Age-related osteoporosis without current pathological fracture: Secondary | ICD-10-CM | POA: Diagnosis not present

## 2020-01-25 DIAGNOSIS — Z1231 Encounter for screening mammogram for malignant neoplasm of breast: Secondary | ICD-10-CM

## 2020-01-25 DIAGNOSIS — R946 Abnormal results of thyroid function studies: Secondary | ICD-10-CM | POA: Diagnosis not present

## 2020-01-25 DIAGNOSIS — E785 Hyperlipidemia, unspecified: Secondary | ICD-10-CM | POA: Diagnosis not present

## 2020-01-25 DIAGNOSIS — R739 Hyperglycemia, unspecified: Secondary | ICD-10-CM | POA: Diagnosis not present

## 2020-01-29 DIAGNOSIS — R82998 Other abnormal findings in urine: Secondary | ICD-10-CM | POA: Diagnosis not present

## 2020-01-29 DIAGNOSIS — E785 Hyperlipidemia, unspecified: Secondary | ICD-10-CM | POA: Diagnosis not present

## 2020-02-01 DIAGNOSIS — Z0000 Encounter for general adult medical examination without abnormal findings: Secondary | ICD-10-CM | POA: Diagnosis not present

## 2020-02-01 DIAGNOSIS — I4891 Unspecified atrial fibrillation: Secondary | ICD-10-CM | POA: Diagnosis not present

## 2020-02-01 DIAGNOSIS — I77819 Aortic ectasia, unspecified site: Secondary | ICD-10-CM | POA: Diagnosis not present

## 2020-02-01 DIAGNOSIS — I129 Hypertensive chronic kidney disease with stage 1 through stage 4 chronic kidney disease, or unspecified chronic kidney disease: Secondary | ICD-10-CM | POA: Diagnosis not present

## 2020-02-01 DIAGNOSIS — R946 Abnormal results of thyroid function studies: Secondary | ICD-10-CM | POA: Diagnosis not present

## 2020-02-01 DIAGNOSIS — Z7901 Long term (current) use of anticoagulants: Secondary | ICD-10-CM | POA: Diagnosis not present

## 2020-02-01 DIAGNOSIS — E663 Overweight: Secondary | ICD-10-CM | POA: Diagnosis not present

## 2020-02-01 DIAGNOSIS — E785 Hyperlipidemia, unspecified: Secondary | ICD-10-CM | POA: Diagnosis not present

## 2020-02-01 DIAGNOSIS — N182 Chronic kidney disease, stage 2 (mild): Secondary | ICD-10-CM | POA: Diagnosis not present

## 2020-02-01 DIAGNOSIS — R809 Proteinuria, unspecified: Secondary | ICD-10-CM | POA: Diagnosis not present

## 2020-02-01 DIAGNOSIS — I872 Venous insufficiency (chronic) (peripheral): Secondary | ICD-10-CM | POA: Diagnosis not present

## 2020-02-01 DIAGNOSIS — R739 Hyperglycemia, unspecified: Secondary | ICD-10-CM | POA: Diagnosis not present

## 2020-03-07 ENCOUNTER — Ambulatory Visit: Payer: Medicare Other

## 2020-03-07 DIAGNOSIS — M25572 Pain in left ankle and joints of left foot: Secondary | ICD-10-CM | POA: Diagnosis not present

## 2020-03-07 DIAGNOSIS — M25561 Pain in right knee: Secondary | ICD-10-CM | POA: Diagnosis not present

## 2020-03-07 DIAGNOSIS — S8002XA Contusion of left knee, initial encounter: Secondary | ICD-10-CM | POA: Diagnosis not present

## 2020-03-07 DIAGNOSIS — M25562 Pain in left knee: Secondary | ICD-10-CM | POA: Diagnosis not present

## 2020-03-07 DIAGNOSIS — S93492A Sprain of other ligament of left ankle, initial encounter: Secondary | ICD-10-CM | POA: Diagnosis not present

## 2020-03-07 DIAGNOSIS — S8001XA Contusion of right knee, initial encounter: Secondary | ICD-10-CM | POA: Diagnosis not present

## 2020-04-01 ENCOUNTER — Ambulatory Visit: Payer: Medicare Other | Admitting: Cardiology

## 2020-04-11 ENCOUNTER — Encounter: Payer: Self-pay | Admitting: Cardiology

## 2020-04-11 ENCOUNTER — Other Ambulatory Visit: Payer: Self-pay

## 2020-04-11 ENCOUNTER — Ambulatory Visit: Payer: Medicare Other | Admitting: Cardiology

## 2020-04-11 VITALS — BP 131/54 | HR 64 | Resp 16 | Ht 64.0 in | Wt 153.0 lb

## 2020-04-11 DIAGNOSIS — I480 Paroxysmal atrial fibrillation: Secondary | ICD-10-CM | POA: Diagnosis not present

## 2020-04-11 DIAGNOSIS — I10 Essential (primary) hypertension: Secondary | ICD-10-CM | POA: Diagnosis not present

## 2020-04-11 DIAGNOSIS — I340 Nonrheumatic mitral (valve) insufficiency: Secondary | ICD-10-CM

## 2020-04-11 NOTE — Progress Notes (Addendum)
Patient referred by Sheena Baton, MD for preop risk stratification  Subjective:   Sheena Taylor, female    DOB: April 03, 1935, 85 y.o.   MRN: [id number]   Chief Complaint  Patient presents with  . Chest Pain  . Results    Stress test  . Follow-up    6 month    HPI  85 y.o. Caucasian female with hypertension, arthritis, paroxysmal atrial fibrillation.  Patient is presently doing well. She reports occasional fatigue when she overexerts herself. Denies chest pain, palpitations, dyspnea. Continues to tolerate anticoagulation without bleeding diathesis.   Current Outpatient Medications on File Prior to Visit  Medication Sig Dispense Refill  . acetaminophen (TYLENOL) 500 MG tablet Take 2 tablets (1,000 mg total) by mouth every 8 (eight) hours. 30 tablet 0  . ELIQUIS 5 MG TABS tablet TAKE 1 TABLET BY MOUTH TWICE A DAY 180 tablet 2  . hydrochlorothiazide (HYDRODIURIL) 25 MG tablet Take 25 mg by mouth daily.    Marland Kitchen lisinopril (ZESTRIL) 20 MG tablet TAKE 1 TABLET BY MOUTH EVERY DAY 90 tablet 2  . Multiple Vitamins-Minerals (MULTIVITAMIN WITH MINERALS) tablet Take 1 tablet by mouth daily.    . Multiple Vitamins-Minerals (OCUVITE PRESERVISION PO) Take 1 tablet by mouth 2 (two) times daily.    . pravastatin (PRAVACHOL) 20 MG tablet Take 20 mg by mouth daily.    . vitamin C (ASCORBIC ACID) 500 MG tablet Take 500 mg by mouth daily after breakfast.     No current facility-administered medications on file prior to visit.    Cardiovascular studies:  EKG 04/11/2020:  Sinus rhythm at 60 bpm PRWP, cannot exclude anteroseptal infarct old  Exercise Sestamibi stress test 10/26/2019: Normal myocardial perfusion. Stress LVEF 56%. Exercise nuclear stress test was performed using Bruce protocol. Patient reached 4.6 METS, and 104% of age predicted maximum heart rate. Exercise capacity was low. Chest pain not reported. Exaggerated heart rate and hypertensive response. Stress EKG revealed no ischemic  changes. Low risk study.  EKG 10/01/2019: Sinus rhythm 76 bpm Occasional PAC    Possible old anteroseptal infarct   Echocardiogram 12/17/2018:  1. The left ventricle has hyperdynamic systolic function, with an ejection fraction of >65%. The cavity size was normal. Left ventricular diastolic Doppler parameters are consistent with impaired relaxation. No evidence of left ventricular regional wall  motion abnormalities.  2. The right ventricle has normal systolic function. The cavity was normal. There is no increase in right ventricular wall thickness.  3. Mild to moderate mitral regurgitation.   4. Mild tricuspid regurgitation. PASP 23 mmhg.  EKG 12/16/2018: Afib w/RVR 137 bpm.  Nonspecific ST-T changes  Recent labs: 01/28/2019: Glucose 133, BUN/Cr 19/0.77. EGFR >60. Na/K 137/3.5.  H/H 10.2/31.9. MCV 99. Platelets 233    Review of Systems  Constitutional: Negative for malaise/fatigue and weight gain.  Cardiovascular: Negative for chest pain, claudication, dyspnea on exertion, leg swelling, near-syncope, orthopnea, palpitations, paroxysmal nocturnal dyspnea and syncope.  Respiratory: Negative for shortness of breath.   Hematologic/Lymphatic: Does not bruise/bleed easily.  Gastrointestinal: Negative for melena.  Neurological: Negative for dizziness and weakness.        Vitals:   04/11/20 1512  BP: (!) 131/54  Pulse: 64  Resp: 16  SpO2: 96%      Objective:   Physical Exam Vitals reviewed.  Constitutional:      General: She is not in acute distress.    Appearance: She is well-developed.  HENT:     Head: Normocephalic and atraumatic.  Neck:     Vascular: No JVD.  Cardiovascular:     Rate and Rhythm: Normal rate and regular rhythm.     Pulses: Intact distal pulses.          Carotid pulses are 2+ on the right side and 2+ on the left side.      Radial pulses are 2+ on the right side and 2+ on the left side.       Popliteal pulses are 2+ on the right side and 2+ on the  left side.       Dorsalis pedis pulses are 2+ on the right side and 2+ on the left side.       Posterior tibial pulses are 1+ on the right side and 1+ on the left side.     Heart sounds: S1 normal and S2 normal. Murmur heard.   Harsh midsystolic murmur is present with a grade of 2/6 at the upper right sternal border radiating to the neck. No gallop.      Comments: Varicosities both feet Pulmonary:     Effort: Pulmonary effort is normal. No respiratory distress.     Breath sounds: Normal breath sounds. No wheezing, rhonchi or rales.  Musculoskeletal:     Right lower leg: No edema.     Left lower leg: No edema.  Neurological:     Mental Status: She is alert.        Assessment & Recommendations:   85 y.o. Caucasian female with hypertension, arthritis, paroxysmal atrial fibrillation.  Paroxysmal atrial fibrillation: In sinus rhythm today. Normal myocardial perfusion, negative stress test 10/26/2019. Low suspicion for ischemia.  CHA2DS2VASc score 4, annual stroke risk 5%. Continue Eliquis 5 mg bid.   Hypertension: Well controlled.  Continue lisinopril, HCTZ   F/u in 6 months    Alethia Berthold, PA-C 04/11/2020, 4:33 PM Office: [phone]  External labs 01/25/2020: A1c 4.8% Total cholesterol 150, triglycerides 64, HDL 61, LDL 84, non-HDL 97 Glucose 99, BUN 17, creatinine 0.8, GFR 92.3, sodium 137, potassium 3.9 Hemoglobin 13.6, hematocrit 40.1, MCV 96.4, platelet 268 Lipoprotein P 76 TSH 0.01, free T4 1.2  Lipids are well controlled.

## 2020-04-21 ENCOUNTER — Ambulatory Visit
Admission: RE | Admit: 2020-04-21 | Discharge: 2020-04-21 | Disposition: A | Discharge status: Home / Self Care | Payer: Medicare Other | Source: Ambulatory Visit | Attending: Internal Medicine | Admitting: Internal Medicine

## 2020-04-21 ENCOUNTER — Other Ambulatory Visit: Payer: Self-pay

## 2020-04-21 DIAGNOSIS — Z1231 Encounter for screening mammogram for malignant neoplasm of breast: Secondary | ICD-10-CM

## 2020-04-22 ENCOUNTER — Other Ambulatory Visit: Payer: Self-pay | Admitting: Internal Medicine

## 2020-04-22 DIAGNOSIS — R928 Other abnormal and inconclusive findings on diagnostic imaging of breast: Secondary | ICD-10-CM

## 2020-04-25 ENCOUNTER — Other Ambulatory Visit: Payer: Self-pay | Admitting: Cardiology

## 2020-04-25 DIAGNOSIS — I10 Essential (primary) hypertension: Secondary | ICD-10-CM

## 2020-05-06 ENCOUNTER — Ambulatory Visit: Payer: Medicare Other

## 2020-05-06 ENCOUNTER — Ambulatory Visit
Admission: RE | Admit: 2020-05-06 | Discharge: 2020-05-06 | Disposition: A | Discharge status: Home / Self Care | Payer: Medicare Other | Source: Ambulatory Visit | Attending: Internal Medicine | Admitting: Internal Medicine

## 2020-05-06 ENCOUNTER — Other Ambulatory Visit: Payer: Self-pay | Admitting: Internal Medicine

## 2020-05-06 ENCOUNTER — Other Ambulatory Visit: Payer: Self-pay

## 2020-05-06 DIAGNOSIS — R922 Inconclusive mammogram: Secondary | ICD-10-CM | POA: Diagnosis not present

## 2020-05-06 DIAGNOSIS — R921 Mammographic calcification found on diagnostic imaging of breast: Secondary | ICD-10-CM | POA: Diagnosis not present

## 2020-05-06 DIAGNOSIS — R928 Other abnormal and inconclusive findings on diagnostic imaging of breast: Secondary | ICD-10-CM

## 2020-07-26 DIAGNOSIS — H43813 Vitreous degeneration, bilateral: Secondary | ICD-10-CM | POA: Diagnosis not present

## 2020-07-26 DIAGNOSIS — H25813 Combined forms of age-related cataract, bilateral: Secondary | ICD-10-CM | POA: Diagnosis not present

## 2020-08-25 DIAGNOSIS — H2512 Age-related nuclear cataract, left eye: Secondary | ICD-10-CM | POA: Diagnosis not present

## 2020-10-09 NOTE — Progress Notes (Signed)
Patient referred by [name], MD for preop risk stratification  Subjective:   Sheena Taylor, female    DOB: [birth year], 86 y.o.   MRN: [id number]   Chief Complaint  Patient presents with   Atrial Fibrillation   Hypertension    HPI  86 y.o. Caucasian female with hypertension, arthritis, paroxysmal atrial fibrillation.  Patient presents for 84-monthfollow-up of paroxysmal atrial fibrillation and hypertension.  At last office visit patient was stable and no changes were made to  medications at that time.  Patient continues to do well without specific complaints.  She is tolerating anticoagulation without bleeding diathesis.  Reports home blood pressure readings averaging 130-135/60s mmHg. Denies chest pain, palpitations, dyspnea, syncope, near-syncope.   The patient and her husband are in the process of moving to downsize into an independent living retirement community.   Current Outpatient Medications on File Prior to Visit  Medication Sig Dispense Refill   acetaminophen (TYLENOL) 500 MG tablet Take 2 tablets (1,000 mg total) by mouth every 8 (eight) hours. 30 tablet 0   ELIQUIS 5 MG TABS tablet TAKE 1 TABLET BY MOUTH TWICE A DAY 180 tablet 2   hydrochlorothiazide (HYDRODIURIL) 25 MG tablet Take 25 mg by mouth daily.     lisinopril (ZESTRIL) 20 MG tablet TAKE 1 TABLET BY MOUTH EVERY DAY 90 tablet 2   Multiple Vitamins-Minerals (MULTIVITAMIN WITH MINERALS) tablet Take 1 tablet by mouth daily.     Multiple Vitamins-Minerals (OCUVITE PRESERVISION PO) Take 1 tablet by mouth 2 (two) times daily.     pravastatin (PRAVACHOL) 20 MG tablet Take 20 mg by mouth daily.     vitamin C (ASCORBIC ACID) 500 MG tablet Take 500 mg by mouth daily after breakfast.     No current facility-administered medications on file prior to visit.    Cardiovascular studies: EKG 10/11/2020:  Sinus bradycardia at a rate of 50 bpm.  Left atrial enlargement.  Incomplete right bundle branch block.  Poor  refreshing, cannot exclude anteroseptal infarct old.  No evidence of ischemia or underlying injury pattern.  Compared to EKG 04/11/2020, no significant change.  Exercise Sestamibi stress test 10/26/2019: Normal myocardial perfusion. Stress LVEF 56%. Exercise nuclear stress test was performed using Bruce protocol. Patient reached 4.6 METS, and 104% of age predicted maximum heart rate. Exercise capacity was low. Chest pain not reported. Exaggerated heart rate and hypertensive response. Stress EKG revealed no ischemic changes. Low risk study.  EKG 10/01/2019: Sinus rhythm 76 bpm Occasional PAC    Possible old anteroseptal infarct   Echocardiogram 12/17/2018:  1. The left ventricle has hyperdynamic systolic function, with an ejection fraction of >65%. The cavity size was normal. Left ventricular diastolic Doppler parameters are consistent with impaired relaxation. No evidence of left ventricular regional wall  motion abnormalities.  2. The right ventricle has normal systolic function. The cavity was normal. There is no increase in right ventricular wall thickness.  3. Mild to moderate mitral regurgitation.   4. Mild tricuspid regurgitation. PASP 23 mmhg.  EKG 12/16/2018: Afib w/RVR 137 bpm.  Nonspecific ST-T changes  Recent labs: 01/25/2020: A1c 4.8% Total cholesterol 150, triglycerides 64, HDL 61, LDL 84, non-HDL 97 Glucose 99, BUN 17, creatinine 0.8, GFR 92.3, sodium 137, potassium 3.9 Hemoglobin 13.6, hematocrit 40.1, MCV 96.4, platelet 268 Lipoprotein P 76 TSH 0.01, free T4 1.2  01/28/2019: Glucose 133, BUN/Cr 19/0.77. EGFR >60. Na/K 137/3.5.  H/H 10.2/31.9. MCV 99. Platelets 233    Review of Systems  Cardiovascular:  Negative for chest pain, claudication, near-syncope, orthopnea and paroxysmal nocturnal dyspnea.  Respiratory:  Negative for shortness of breath.   Hematologic/Lymphatic: Does not bruise/bleed easily.  Gastrointestinal:  Negative for hematochezia and melena.   Genitourinary:  Negative for hematuria.  Neurological:  Negative for dizziness.       Vitals:   10/11/20 0919  BP: 137/61  Pulse: 63  Temp: 98.1 F (36.7 C)  SpO2: 98%      Objective:   Physical Exam Vitals reviewed.  HENT:     Head: Normocephalic and atraumatic.  Cardiovascular:     Rate and Rhythm: Regular rhythm. Bradycardia present.     Pulses: Intact distal pulses.          Carotid pulses are 2+ on the right side and 2+ on the left side.      Radial pulses are 2+ on the right side and 2+ on the left side.       Popliteal pulses are 2+ on the right side and 2+ on the left side.       Dorsalis pedis pulses are 2+ on the right side and 2+ on the left side.       Posterior tibial pulses are 1+ on the right side and 1+ on the left side.     Heart sounds: S1 normal and S2 normal. No murmur heard.   No gallop.  Pulmonary:     Effort: Pulmonary effort is normal. No respiratory distress.     Breath sounds: No wheezing, rhonchi or rales.  Musculoskeletal:     Right lower leg: No edema.     Left lower leg: No edema.  Neurological:     Mental Status: She is alert.       Assessment & Recommendations:   86 y.o. Caucasian female with hypertension, arthritis, paroxysmal atrial fibrillation.  Paroxysmal atrial fibrillation: She continues to maintain sinus rhythm. Normal myocardial perfusion, negative stress test 10/26/2019. Low suspicion for ischemia.  CHA2DS2VASc score 4, annual stroke risk 5%. Continue Eliquis 5 mg bid she is tolerating this without bleeding diathesis.  Hypertension: Well controlled.  Continue lisinopril, HCTZ   Hyperlipidemia: Continue pravastatin.  Will defer further management to PCP.  Follow-up in 1 year, sooner if needed, for paroxysmal atrial fibrillation, hypertension, and hyperlipidemia.   Alethia Berthold, PA-C 10/11/2020, 10:07 AM Office: [phone]

## 2020-10-10 DIAGNOSIS — H2511 Age-related nuclear cataract, right eye: Secondary | ICD-10-CM | POA: Diagnosis not present

## 2020-10-11 ENCOUNTER — Other Ambulatory Visit: Payer: Self-pay

## 2020-10-11 ENCOUNTER — Encounter: Payer: Self-pay | Admitting: Student

## 2020-10-11 ENCOUNTER — Ambulatory Visit: Payer: Medicare Other | Admitting: Student

## 2020-10-11 VITALS — BP 137/61 | HR 63 | Temp 98.1°F | Ht 64.0 in | Wt 152.0 lb

## 2020-10-11 DIAGNOSIS — I10 Essential (primary) hypertension: Secondary | ICD-10-CM | POA: Diagnosis not present

## 2020-10-11 DIAGNOSIS — I480 Paroxysmal atrial fibrillation: Secondary | ICD-10-CM

## 2020-10-11 DIAGNOSIS — I340 Nonrheumatic mitral (valve) insufficiency: Secondary | ICD-10-CM

## 2020-10-13 IMAGING — MG DIGITAL SCREENING BILAT W/ TOMO W/ CAD
8 series · 8 of 24 positions shown · non-contrast
Comparison: Previous exam(s).

CLINICAL DATA: Screening.

EXAM:
DIGITAL SCREENING BILATERAL MAMMOGRAM WITH TOMO AND CAD

[L MLO synth-2D]
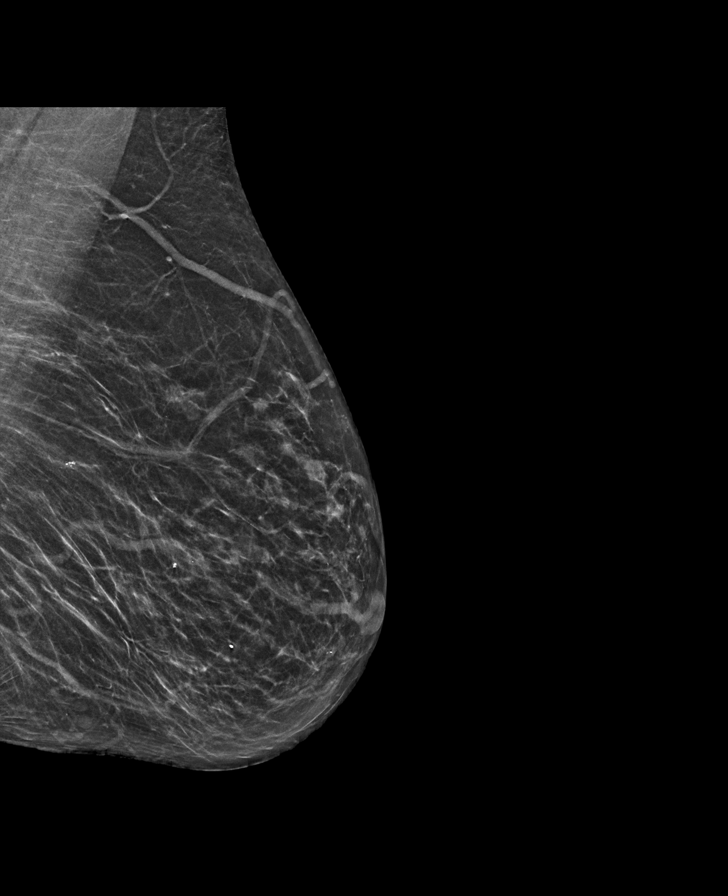

[R CC synth-2D]
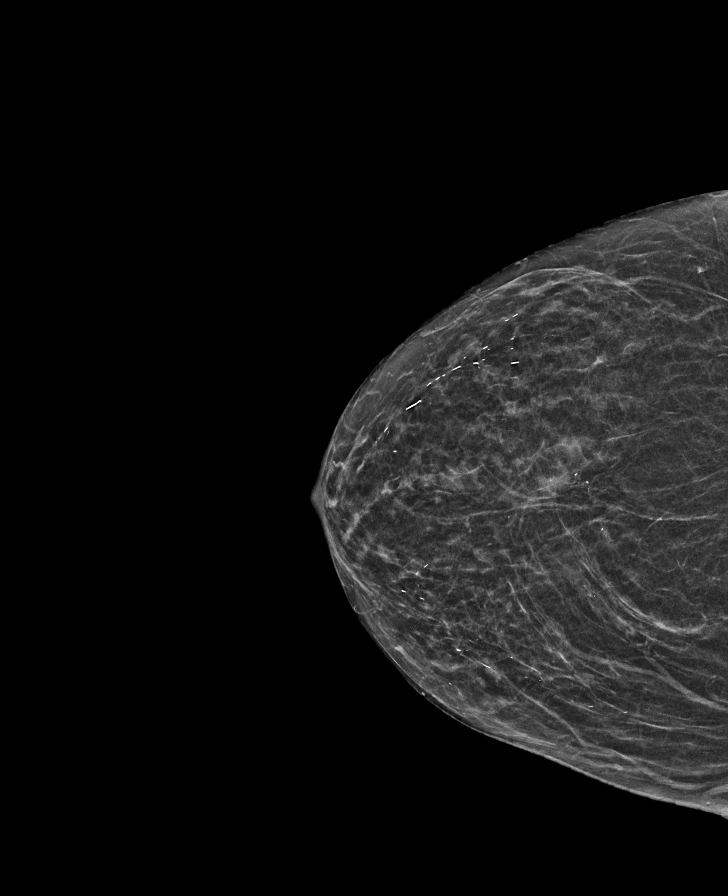

[L CC synth-2D]
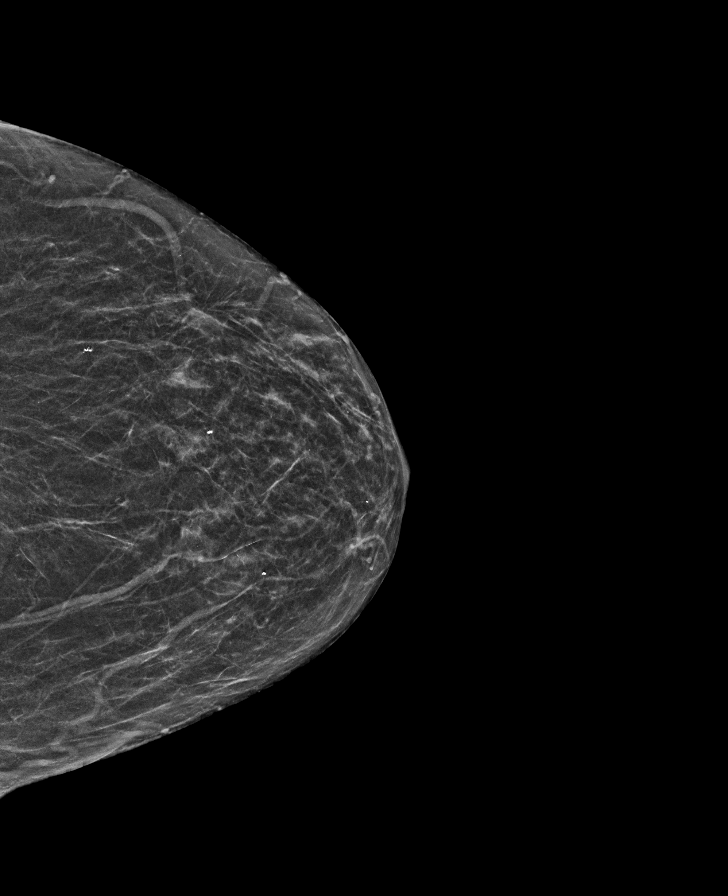

[R MLO synth-2D]
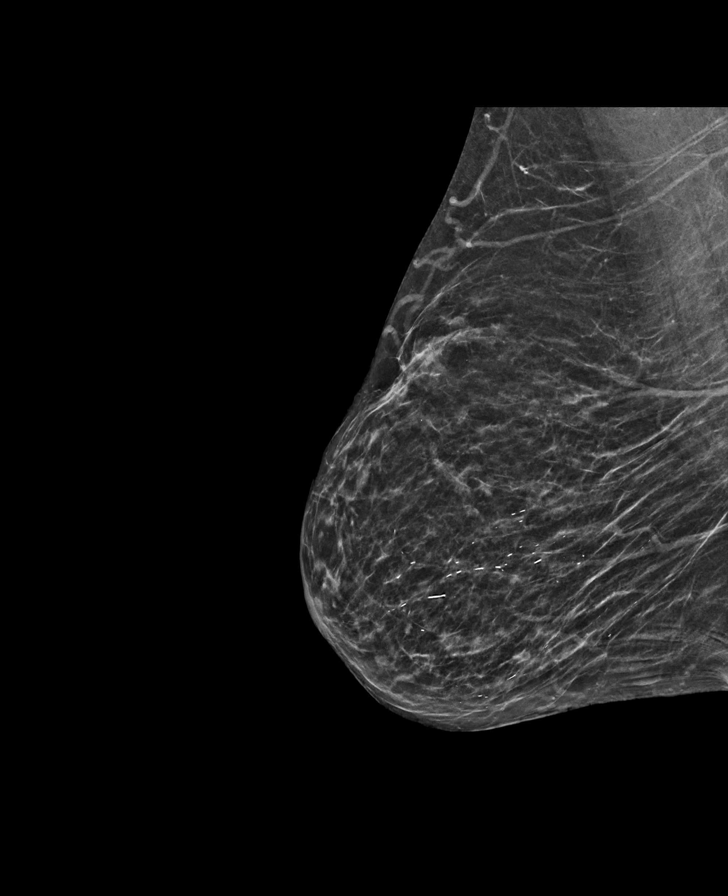

[R MLO tomo · tomo slice 24/47.0]
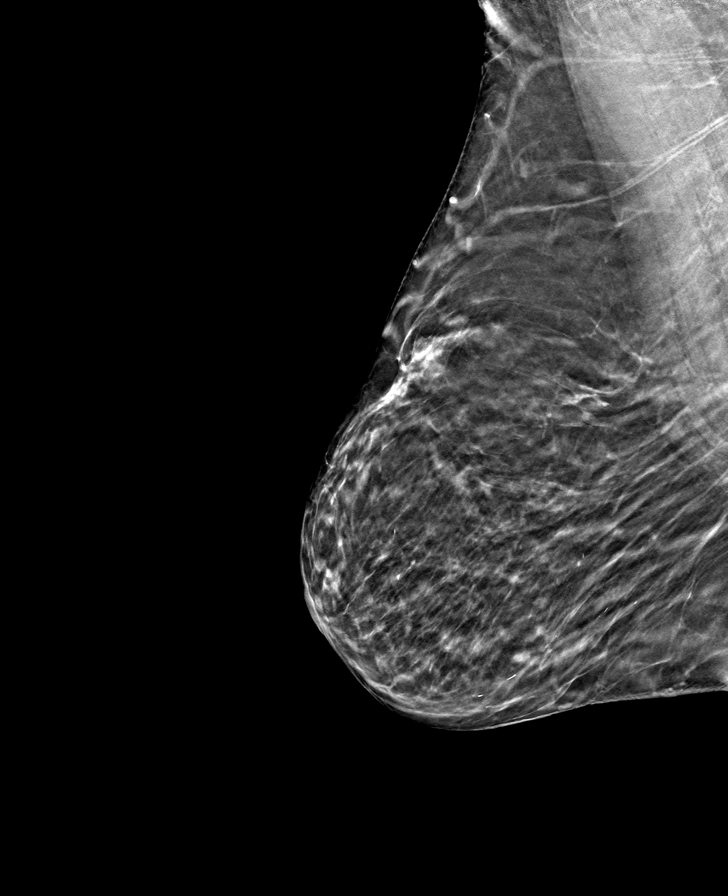

[R CC tomo · tomo slice 22/43.0]
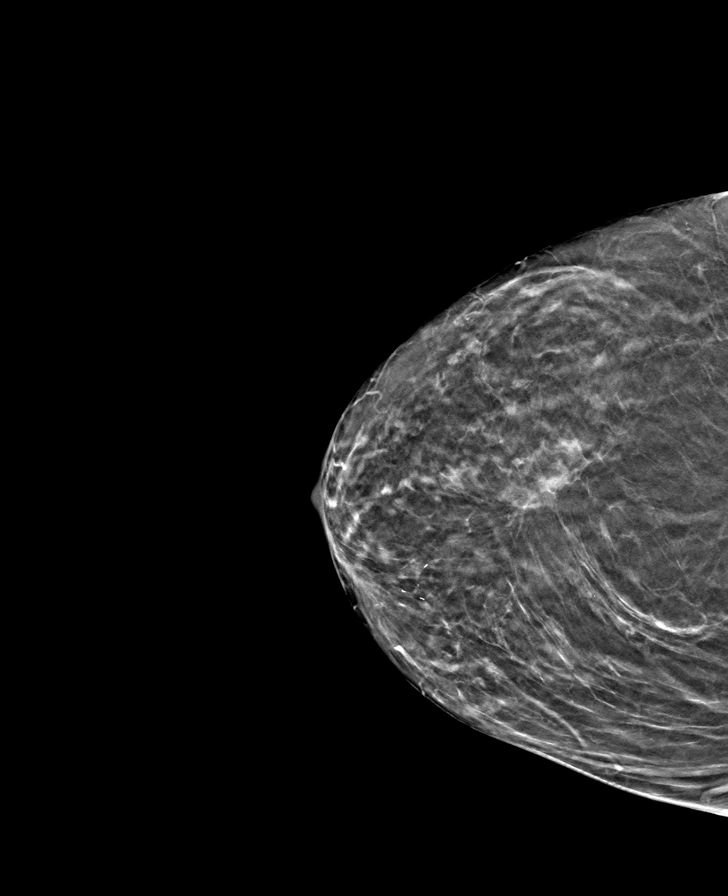

[L CC tomo · tomo slice 23/46.0]
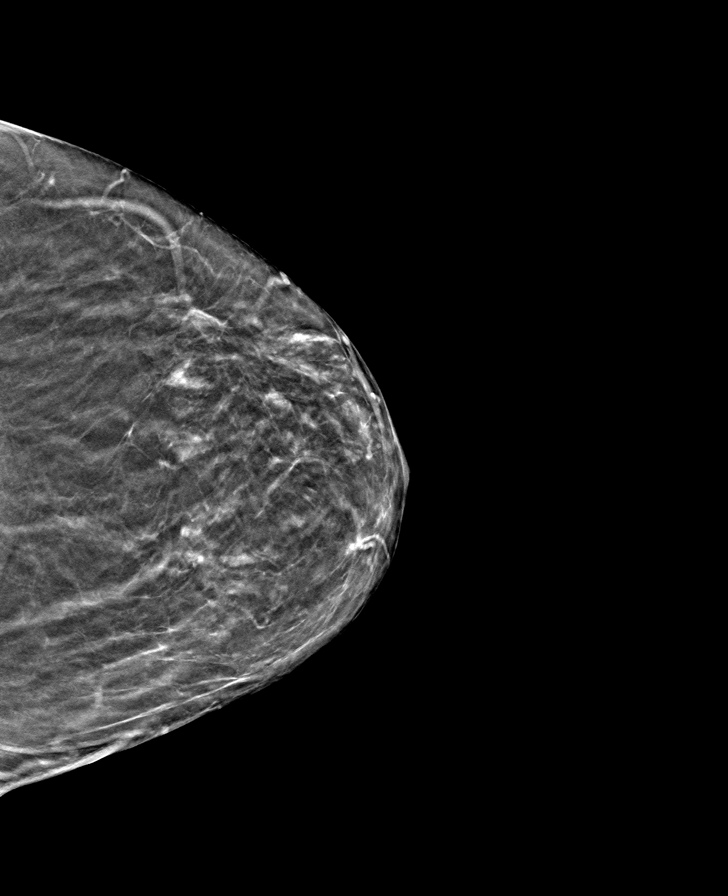

[L MLO tomo · tomo slice 23/45.0]
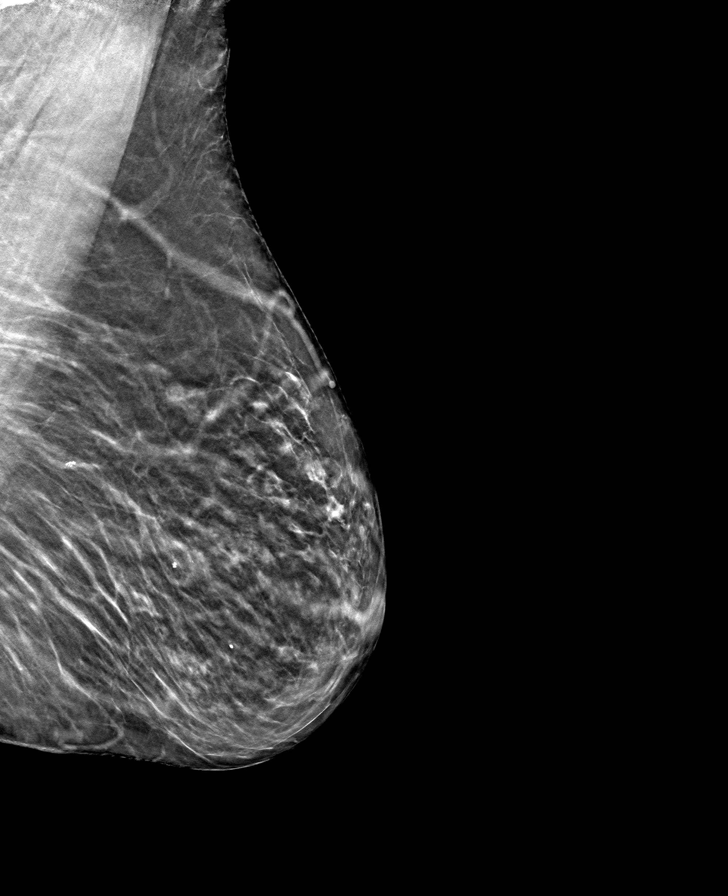

[8 of 24 positions shown; findings below may reference images not displayed]

ACR Breast Density Category b: There are scattered areas of
fibroglandular density.
FINDINGS: There are no findings suspicious for malignancy. Images were
processed with CAD.
IMPRESSION: No mammographic evidence of malignancy. A result letter of this
screening mammogram will be mailed directly to the patient.

RECOMMENDATION:
Screening mammogram in one year. (Code:[id number])

BI-RADS CATEGORY  1: Negative.

## 2020-10-20 DIAGNOSIS — H2511 Age-related nuclear cataract, right eye: Secondary | ICD-10-CM | POA: Diagnosis not present

## 2020-11-10 ENCOUNTER — Other Ambulatory Visit: Payer: Self-pay

## 2020-11-10 ENCOUNTER — Other Ambulatory Visit: Payer: Self-pay | Admitting: Internal Medicine

## 2020-11-10 ENCOUNTER — Ambulatory Visit
Admission: RE | Admit: 2020-11-10 | Discharge: 2020-11-10 | Disposition: A | Discharge status: Home / Self Care | Payer: Medicare Other | Source: Ambulatory Visit | Attending: Internal Medicine | Admitting: Internal Medicine

## 2020-11-10 DIAGNOSIS — R928 Other abnormal and inconclusive findings on diagnostic imaging of breast: Secondary | ICD-10-CM

## 2020-11-10 DIAGNOSIS — R921 Mammographic calcification found on diagnostic imaging of breast: Secondary | ICD-10-CM

## 2020-11-10 DIAGNOSIS — R922 Inconclusive mammogram: Secondary | ICD-10-CM | POA: Diagnosis not present

## 2021-01-02 DIAGNOSIS — Z23 Encounter for immunization: Secondary | ICD-10-CM | POA: Diagnosis not present

## 2021-01-14 DIAGNOSIS — Z23 Encounter for immunization: Secondary | ICD-10-CM | POA: Diagnosis not present

## 2021-01-19 ENCOUNTER — Other Ambulatory Visit: Payer: Self-pay | Admitting: Cardiology

## 2021-01-19 DIAGNOSIS — I10 Essential (primary) hypertension: Secondary | ICD-10-CM

## 2021-02-10 DIAGNOSIS — R946 Abnormal results of thyroid function studies: Secondary | ICD-10-CM | POA: Diagnosis not present

## 2021-02-10 DIAGNOSIS — E785 Hyperlipidemia, unspecified: Secondary | ICD-10-CM | POA: Diagnosis not present

## 2021-02-10 DIAGNOSIS — I10 Essential (primary) hypertension: Secondary | ICD-10-CM | POA: Diagnosis not present

## 2021-02-10 DIAGNOSIS — R739 Hyperglycemia, unspecified: Secondary | ICD-10-CM | POA: Diagnosis not present

## 2021-02-17 DIAGNOSIS — I872 Venous insufficiency (chronic) (peripheral): Secondary | ICD-10-CM | POA: Diagnosis not present

## 2021-02-17 DIAGNOSIS — Z1331 Encounter for screening for depression: Secondary | ICD-10-CM | POA: Diagnosis not present

## 2021-02-17 DIAGNOSIS — R82998 Other abnormal findings in urine: Secondary | ICD-10-CM | POA: Diagnosis not present

## 2021-02-17 DIAGNOSIS — R946 Abnormal results of thyroid function studies: Secondary | ICD-10-CM | POA: Diagnosis not present

## 2021-02-17 DIAGNOSIS — I129 Hypertensive chronic kidney disease with stage 1 through stage 4 chronic kidney disease, or unspecified chronic kidney disease: Secondary | ICD-10-CM | POA: Diagnosis not present

## 2021-02-17 DIAGNOSIS — E663 Overweight: Secondary | ICD-10-CM | POA: Diagnosis not present

## 2021-02-17 DIAGNOSIS — Z7901 Long term (current) use of anticoagulants: Secondary | ICD-10-CM | POA: Diagnosis not present

## 2021-02-17 DIAGNOSIS — R809 Proteinuria, unspecified: Secondary | ICD-10-CM | POA: Diagnosis not present

## 2021-02-17 DIAGNOSIS — N182 Chronic kidney disease, stage 2 (mild): Secondary | ICD-10-CM | POA: Diagnosis not present

## 2021-02-17 DIAGNOSIS — E785 Hyperlipidemia, unspecified: Secondary | ICD-10-CM | POA: Diagnosis not present

## 2021-02-17 DIAGNOSIS — R739 Hyperglycemia, unspecified: Secondary | ICD-10-CM | POA: Diagnosis not present

## 2021-02-17 DIAGNOSIS — Z0000 Encounter for general adult medical examination without abnormal findings: Secondary | ICD-10-CM | POA: Diagnosis not present

## 2021-02-17 DIAGNOSIS — Z1339 Encounter for screening examination for other mental health and behavioral disorders: Secondary | ICD-10-CM | POA: Diagnosis not present

## 2021-02-17 DIAGNOSIS — I4891 Unspecified atrial fibrillation: Secondary | ICD-10-CM | POA: Diagnosis not present

## 2021-02-17 DIAGNOSIS — I77819 Aortic ectasia, unspecified site: Secondary | ICD-10-CM | POA: Diagnosis not present

## 2021-03-07 ENCOUNTER — Other Ambulatory Visit: Payer: Self-pay | Admitting: Cardiology

## 2021-03-07 DIAGNOSIS — I480 Paroxysmal atrial fibrillation: Secondary | ICD-10-CM

## 2021-05-15 ENCOUNTER — Other Ambulatory Visit: Payer: Self-pay

## 2021-05-15 ENCOUNTER — Ambulatory Visit
Admission: RE | Admit: 2021-05-15 | Discharge: 2021-05-15 | Disposition: A | Discharge status: Home / Self Care | Payer: Medicare Other | Source: Ambulatory Visit | Attending: Internal Medicine | Admitting: Internal Medicine

## 2021-05-15 DIAGNOSIS — R921 Mammographic calcification found on diagnostic imaging of breast: Secondary | ICD-10-CM

## 2021-09-16 ENCOUNTER — Other Ambulatory Visit: Payer: Self-pay | Admitting: Cardiology

## 2021-09-16 DIAGNOSIS — I10 Essential (primary) hypertension: Secondary | ICD-10-CM

## 2021-12-20 ENCOUNTER — Encounter: Payer: Self-pay | Admitting: Internal Medicine

## 2021-12-20 ENCOUNTER — Ambulatory Visit: Payer: Medicare Other | Admitting: Internal Medicine

## 2021-12-20 VITALS — Temp 98.6°F | Resp 16 | Ht 64.0 in | Wt 159.0 lb

## 2021-12-20 DIAGNOSIS — I10 Essential (primary) hypertension: Secondary | ICD-10-CM

## 2021-12-20 DIAGNOSIS — I340 Nonrheumatic mitral (valve) insufficiency: Secondary | ICD-10-CM

## 2021-12-20 DIAGNOSIS — I480 Paroxysmal atrial fibrillation: Secondary | ICD-10-CM

## 2021-12-20 MED ORDER — APIXABAN 5 MG PO TABS: 5.0000 mg | ORAL_TABLET | Freq: Two times a day (BID) | ORAL | 6 refills | Status: DC

## 2021-12-20 NOTE — Progress Notes (Signed)
Patient referred by [name], MD for preop risk stratification  Subjective:   Sheena Taylor, female    DOB: [birth year], 87 y.o.   MRN: [id number]   No chief complaint on file.   HPI  87 y.o. Caucasian female with hypertension, arthritis, paroxysmal atrial fibrillation.  Patient presents for 42-monthfollow-up of paroxysmal atrial fibrillation and hypertension.  At last office visit patient was stable and no changes were made to  medications at that time.  Patient continues to do well without specific complaints.  She is tolerating anticoagulation without bleeding diathesis.  Denies chest pain, palpitations, dyspnea, syncope, near-syncope.     Current Outpatient Medications on File Prior to Visit  Medication Sig Dispense Refill   acetaminophen (TYLENOL) 500 MG tablet Take 2 tablets (1,000 mg total) by mouth every 8 (eight) hours. 30 tablet 0   ELIQUIS 5 MG TABS tablet TAKE 1 TABLET BY MOUTH TWICE A DAY 180 tablet 2   hydrochlorothiazide (HYDRODIURIL) 25 MG tablet Take 25 mg by mouth daily.     lisinopril (ZESTRIL) 20 MG tablet TAKE 1 TABLET BY MOUTH EVERY DAY 90 tablet 2   Multiple Vitamins-Minerals (MULTIVITAMIN WITH MINERALS) tablet Take 1 tablet by mouth daily.     Multiple Vitamins-Minerals (OCUVITE PRESERVISION PO) Take 1 tablet by mouth 2 (two) times daily.     pravastatin (PRAVACHOL) 20 MG tablet Take 20 mg by mouth daily.     vitamin C (ASCORBIC ACID) 500 MG tablet Take 500 mg by mouth daily after breakfast.     No current facility-administered medications on file prior to visit.    Cardiovascular studies: EKG 10/11/2020:  Sinus bradycardia at a rate of 50 bpm.  Left atrial enlargement.  Incomplete right bundle branch block.  Poor refreshing, cannot exclude anteroseptal infarct old.  No evidence of ischemia or underlying injury pattern.  Compared to EKG 04/11/2020, no significant change.  Exercise Sestamibi stress test 10/26/2019: Normal myocardial perfusion.  Stress LVEF 56%. Exercise nuclear stress test was performed using Bruce protocol. Patient reached 4.6 METS, and 104% of age predicted maximum heart rate. Exercise capacity was low. Chest pain not reported. Exaggerated heart rate and hypertensive response. Stress EKG revealed no ischemic changes. Low risk study.  EKG 10/01/2019: Sinus rhythm 76 bpm Occasional PAC    Possible old anteroseptal infarct   Echocardiogram 12/17/2018:  1. The left ventricle has hyperdynamic systolic function, with an ejection fraction of >65%. The cavity size was normal. Left ventricular diastolic Doppler parameters are consistent with impaired relaxation. No evidence of left ventricular regional wall  motion abnormalities.  2. The right ventricle has normal systolic function. The cavity was normal. There is no increase in right ventricular wall thickness.  3. Mild to moderate mitral regurgitation.   4. Mild tricuspid regurgitation. PASP 23 mmhg.  EKG 12/16/2018: Afib w/RVR 137 bpm.  Nonspecific ST-T changes  Recent labs: 01/25/2020: A1c 4.8% Total cholesterol 150, triglycerides 64, HDL 61, LDL 84, non-HDL 97 Glucose 99, BUN 17, creatinine 0.8, GFR 92.3, sodium 137, potassium 3.9 Hemoglobin 13.6, hematocrit 40.1, MCV 96.4, platelet 268 Lipoprotein P 76 TSH 0.01, free T4 1.2  01/28/2019: Glucose 133, BUN/Cr 19/0.77. EGFR >60. Na/K 137/3.5.  H/H 10.2/31.9. MCV 99. Platelets 233    Review of Systems  Cardiovascular:  Negative for chest pain, claudication, near-syncope, orthopnea and paroxysmal nocturnal dyspnea.  Respiratory:  Negative for shortness of breath.   Hematologic/Lymphatic: Does not bruise/bleed easily.  Gastrointestinal:  Negative for hematochezia and melena.  Genitourinary:  Negative for hematuria.  Neurological:  Negative for dizziness.        Vitals:   12/20/21 1341  Resp: 16  Temp: 98.6 F (37 C)  SpO2: 95%      Objective:   Physical Exam Vitals reviewed.  HENT:     Head:  Normocephalic and atraumatic.  Cardiovascular:     Rate and Rhythm: Regular rhythm. Bradycardia present.     Pulses: Intact distal pulses.          Carotid pulses are 2+ on the right side and 2+ on the left side.      Radial pulses are 2+ on the right side and 2+ on the left side.       Popliteal pulses are 2+ on the right side and 2+ on the left side.       Dorsalis pedis pulses are 2+ on the right side and 2+ on the left side.       Posterior tibial pulses are 1+ on the right side and 1+ on the left side.     Heart sounds: S1 normal and S2 normal. No murmur heard.    No gallop.  Pulmonary:     Effort: Pulmonary effort is normal. No respiratory distress.     Breath sounds: No wheezing, rhonchi or rales.  Musculoskeletal:     Right lower leg: No edema.     Left lower leg: No edema.  Neurological:     Mental Status: She is alert.        Assessment & Recommendations:   87 y.o. Caucasian female with hypertension, arthritis, paroxysmal atrial fibrillation.  Paroxysmal atrial fibrillation: She continues to maintain sinus rhythm. Normal myocardial perfusion, negative stress test 10/26/2019. Low suspicion for ischemia.  CHA2DS2VASc score 4, annual stroke risk 5%. Continue Eliquis 5 mg bid she is tolerating this without bleeding diathesis. Samples of Eliquis given, new script sent to pharmacy  Hypertension: Well controlled.  Continue lisinopril, HCTZ   Hyperlipidemia: Continue pravastatin.  Will defer further management to PCP.  Follow-up in 1 year, sooner if needed, for paroxysmal atrial fibrillation, hypertension, and hyperlipidemia.   Floydene Flock, DO, [facility] 12/20/2021, 2:30 PM Office: [phone]

## 2022-04-11 ENCOUNTER — Other Ambulatory Visit: Payer: Self-pay | Admitting: Internal Medicine

## 2022-04-11 DIAGNOSIS — R928 Other abnormal and inconclusive findings on diagnostic imaging of breast: Secondary | ICD-10-CM

## 2022-04-20 ENCOUNTER — Other Ambulatory Visit: Payer: Self-pay | Admitting: Cardiology

## 2022-04-20 DIAGNOSIS — I10 Essential (primary) hypertension: Secondary | ICD-10-CM

## 2022-06-03 ENCOUNTER — Encounter (HOSPITAL_COMMUNITY): Payer: Self-pay

## 2022-06-03 ENCOUNTER — Inpatient Hospital Stay (HOSPITAL_COMMUNITY)
Admission: EM | Disposition: A | Discharge status: Skilled Nursing Facility | Payer: Self-pay | Source: Home / Self Care | Attending: Cardiology

## 2022-06-03 ENCOUNTER — Inpatient Hospital Stay (HOSPITAL_COMMUNITY): Payer: Medicare Other

## 2022-06-03 ENCOUNTER — Other Ambulatory Visit: Payer: Self-pay

## 2022-06-03 ENCOUNTER — Emergency Department (HOSPITAL_COMMUNITY): Payer: Medicare Other

## 2022-06-03 ENCOUNTER — Inpatient Hospital Stay (HOSPITAL_COMMUNITY)
Admission: EM | Admit: 2022-06-03 | Discharge: 2022-06-12 | DRG: 314 | Disposition: A | Discharge status: Skilled Nursing Facility | Payer: Medicare Other | Attending: Cardiology | Admitting: Cardiology

## 2022-06-03 DIAGNOSIS — N179 Acute kidney failure, unspecified: Secondary | ICD-10-CM | POA: Diagnosis present

## 2022-06-03 DIAGNOSIS — Z79899 Other long term (current) drug therapy: Secondary | ICD-10-CM | POA: Diagnosis not present

## 2022-06-03 DIAGNOSIS — I300 Acute nonspecific idiopathic pericarditis: Secondary | ICD-10-CM | POA: Diagnosis present

## 2022-06-03 DIAGNOSIS — H3530 Unspecified macular degeneration: Secondary | ICD-10-CM | POA: Diagnosis present

## 2022-06-03 DIAGNOSIS — K521 Toxic gastroenteritis and colitis: Secondary | ICD-10-CM | POA: Diagnosis not present

## 2022-06-03 DIAGNOSIS — Z96653 Presence of artificial knee joint, bilateral: Secondary | ICD-10-CM | POA: Diagnosis present

## 2022-06-03 DIAGNOSIS — Z1152 Encounter for screening for COVID-19: Secondary | ICD-10-CM

## 2022-06-03 DIAGNOSIS — Z885 Allergy status to narcotic agent status: Secondary | ICD-10-CM | POA: Diagnosis not present

## 2022-06-03 DIAGNOSIS — Z8249 Family history of ischemic heart disease and other diseases of the circulatory system: Secondary | ICD-10-CM | POA: Diagnosis not present

## 2022-06-03 DIAGNOSIS — I4819 Other persistent atrial fibrillation: Secondary | ICD-10-CM

## 2022-06-03 DIAGNOSIS — Y92239 Unspecified place in hospital as the place of occurrence of the external cause: Secondary | ICD-10-CM | POA: Diagnosis not present

## 2022-06-03 DIAGNOSIS — R197 Diarrhea, unspecified: Secondary | ICD-10-CM | POA: Diagnosis not present

## 2022-06-03 DIAGNOSIS — I10 Essential (primary) hypertension: Secondary | ICD-10-CM | POA: Diagnosis present

## 2022-06-03 DIAGNOSIS — R570 Cardiogenic shock: Secondary | ICD-10-CM | POA: Diagnosis present

## 2022-06-03 DIAGNOSIS — I314 Cardiac tamponade: Secondary | ICD-10-CM

## 2022-06-03 DIAGNOSIS — J90 Pleural effusion, not elsewhere classified: Secondary | ICD-10-CM

## 2022-06-03 DIAGNOSIS — I480 Paroxysmal atrial fibrillation: Principal | ICD-10-CM

## 2022-06-03 DIAGNOSIS — Z7901 Long term (current) use of anticoagulants: Secondary | ICD-10-CM

## 2022-06-03 DIAGNOSIS — F17210 Nicotine dependence, cigarettes, uncomplicated: Secondary | ICD-10-CM | POA: Diagnosis present

## 2022-06-03 DIAGNOSIS — E871 Hypo-osmolality and hyponatremia: Secondary | ICD-10-CM | POA: Diagnosis present

## 2022-06-03 DIAGNOSIS — J9811 Atelectasis: Secondary | ICD-10-CM | POA: Diagnosis not present

## 2022-06-03 DIAGNOSIS — E876 Hypokalemia: Secondary | ICD-10-CM | POA: Diagnosis not present

## 2022-06-03 DIAGNOSIS — I3139 Other pericardial effusion (noninflammatory): Principal | ICD-10-CM

## 2022-06-03 DIAGNOSIS — I4891 Unspecified atrial fibrillation: Secondary | ICD-10-CM

## 2022-06-03 DIAGNOSIS — T504X5A Adverse effect of drugs affecting uric acid metabolism, initial encounter: Secondary | ICD-10-CM | POA: Diagnosis not present

## 2022-06-03 HISTORY — PX: PERICARDIOCENTESIS: CATH118255

## 2022-06-03 LAB — BODY FLUID CELL COUNT WITH DIFFERENTIAL
Eos, Fluid: 1 %
Lymphs, Fluid: 8 %
Monocyte-Macrophage-Serous Fluid: 12 % — ABNORMAL LOW (ref 50–90)
Neutrophil Count, Fluid: 78 % — ABNORMAL HIGH (ref 0–25)
Other Cells, Fluid: 1 %
Total Nucleated Cell Count, Fluid: 11130 cu mm — ABNORMAL HIGH (ref 0–1000)

## 2022-06-03 LAB — COMPREHENSIVE METABOLIC PANEL
ALT: 21 U/L (ref 0–44)
AST: 30 U/L (ref 15–41)
Albumin: 2.6 g/dL — ABNORMAL LOW (ref 3.5–5.0)
Alkaline Phosphatase: 124 U/L (ref 38–126)
Anion gap: 16 — ABNORMAL HIGH (ref 5–15)
BUN: 33 mg/dL — ABNORMAL HIGH (ref 8–23)
CO2: 19 mmol/L — ABNORMAL LOW (ref 22–32)
Calcium: 8.7 mg/dL — ABNORMAL LOW (ref 8.9–10.3)
Chloride: 98 mmol/L (ref 98–111)
Creatinine, Ser: 1.26 mg/dL — ABNORMAL HIGH (ref 0.44–1.00)
GFR, Estimated: 42 mL/min — ABNORMAL LOW (ref >60)
Glucose, Bld: 114 mg/dL — ABNORMAL HIGH (ref 70–99)
Potassium: 3.8 mmol/L (ref 3.5–5.1)
Sodium: 133 mmol/L — ABNORMAL LOW (ref 135–145)
Total Bilirubin: 1.2 mg/dL (ref 0.3–1.2)
Total Protein: 6 g/dL — ABNORMAL LOW (ref 6.5–8.1)

## 2022-06-03 LAB — URINALYSIS, W/ REFLEX TO CULTURE (INFECTION SUSPECTED)
Bacteria, UA: NONE SEEN
Bilirubin Urine: NEGATIVE
Glucose, UA: NEGATIVE mg/dL
Hgb urine dipstick: NEGATIVE
Ketones, ur: NEGATIVE mg/dL
Nitrite: NEGATIVE
Protein, ur: NEGATIVE mg/dL
Specific Gravity, Urine: 1.01 (ref 1.005–1.030)
pH: 5 (ref 5.0–8.0)

## 2022-06-03 LAB — TROPONIN I (HIGH SENSITIVITY)
Troponin I (High Sensitivity): 3 ng/L (ref <18)
Troponin I (High Sensitivity): 4 ng/L (ref <18)

## 2022-06-03 LAB — CBC WITH DIFFERENTIAL/PLATELET
Abs Immature Granulocytes: 0.1 10*3/uL — ABNORMAL HIGH (ref 0.00–0.07)
Basophils Absolute: 0.1 10*3/uL (ref 0.0–0.1)
Basophils Relative: 1 %
Eosinophils Absolute: 0.3 10*3/uL (ref 0.0–0.5)
Eosinophils Relative: 2 %
HCT: 30.9 % — ABNORMAL LOW (ref 36.0–46.0)
Hemoglobin: 9.7 g/dL — ABNORMAL LOW (ref 12.0–15.0)
Immature Granulocytes: 1 %
Lymphocytes Relative: 11 %
Lymphs Abs: 1.5 10*3/uL (ref 0.7–4.0)
MCH: 30.1 pg (ref 26.0–34.0)
MCHC: 31.4 g/dL (ref 30.0–36.0)
MCV: 96 fL (ref 80.0–100.0)
Monocytes Absolute: 1.5 10*3/uL — ABNORMAL HIGH (ref 0.1–1.0)
Monocytes Relative: 11 %
Neutro Abs: 9.7 10*3/uL — ABNORMAL HIGH (ref 1.7–7.7)
Neutrophils Relative %: 74 %
Platelets: 390 10*3/uL (ref 150–400)
RBC: 3.22 MIL/uL — ABNORMAL LOW (ref 3.87–5.11)
RDW: 13.4 % (ref 11.5–15.5)
WBC: 13.1 10*3/uL — ABNORMAL HIGH (ref 4.0–10.5)
nRBC: 0 % (ref 0.0–0.2)

## 2022-06-03 LAB — RESP PANEL BY RT-PCR (RSV, FLU A&B, COVID)  RVPGX2
Influenza A by PCR: NEGATIVE
Influenza B by PCR: NEGATIVE
Resp Syncytial Virus by PCR: NEGATIVE
SARS Coronavirus 2 by RT PCR: NEGATIVE

## 2022-06-03 LAB — ECHOCARDIOGRAM LIMITED: Weight: 2464 oz

## 2022-06-03 LAB — LACTIC ACID, PLASMA
Lactic Acid, Venous: 2.9 mmol/L (ref 0.5–1.9)
Lactic Acid, Venous: 2 mmol/L (ref 0.5–1.9)

## 2022-06-03 LAB — MAGNESIUM: Magnesium: 2.1 mg/dL (ref 1.7–2.4)

## 2022-06-03 SURGERY — PERICARDIOCENTESIS
Anesthesia: LOCAL

## 2022-06-03 MED ORDER — ONDANSETRON HCL 4 MG/2ML IJ SOLN: 4.0000 mg | Freq: Once | INTRAMUSCULAR | Status: AC

## 2022-06-03 MED ORDER — DILTIAZEM HCL-DEXTROSE 125-5 MG/125ML-% IV SOLN (PREMIX)
5.0000 mg/h | INTRAVENOUS | Status: DC
  Filled 2022-06-03: qty 125

## 2022-06-03 MED ORDER — LACTATED RINGERS IV BOLUS
500.0000 mL | Freq: Once | INTRAVENOUS | Status: AC
Start: 2022-06-03 — End: 2022-06-03
  Administered 2022-06-03: 500 mL via INTRAVENOUS

## 2022-06-03 MED ORDER — VANCOMYCIN HCL 1500 MG/300ML IV SOLN
1500.0000 mg | Freq: Once | INTRAVENOUS | Status: DC
  Filled 2022-06-03: qty 300

## 2022-06-03 MED ORDER — VERAPAMIL HCL 2.5 MG/ML IV SOLN
INTRAVENOUS | Status: AC
  Filled 2022-06-03: qty 2

## 2022-06-03 MED ORDER — NOREPINEPHRINE 4 MG/250ML-% IV SOLN: 2.0000 ug/min | INTRAVENOUS | Status: DC

## 2022-06-03 MED ORDER — LIDOCAINE HCL (PF) 1 % IJ SOLN
INTRAMUSCULAR | Status: AC
  Filled 2022-06-03: qty 30

## 2022-06-03 MED ORDER — DILTIAZEM LOAD VIA INFUSION
10.0000 mg | Freq: Once | INTRAVENOUS | Status: DC
Start: 2022-06-03 — End: 2022-06-03
  Filled 2022-06-03: qty 10

## 2022-06-03 MED ORDER — LIDOCAINE HCL (PF) 1 % IJ SOLN
INTRAMUSCULAR | Status: DC | PRN
  Administered 2022-06-03: 15 mL

## 2022-06-03 MED ORDER — DILTIAZEM HCL-DEXTROSE 125-5 MG/125ML-% IV SOLN (PREMIX)
5.0000 mg/h | INTRAVENOUS | Status: AC
  Administered 2022-06-03: 13 mg/h via INTRAVENOUS
  Administered 2022-06-03: 5 mg/h via INTRAVENOUS
  Administered 2022-06-04: 11 mg/h via INTRAVENOUS
  Filled 2022-06-03 (×3): qty 125

## 2022-06-03 MED ORDER — ORAL CARE MOUTH RINSE: 15.0000 mL | OROMUCOSAL | Status: DC | PRN

## 2022-06-03 MED ORDER — SODIUM CHLORIDE 0.9 % IV SOLN: 250.0000 mL | INTRAVENOUS | Status: DC | PRN

## 2022-06-03 MED ORDER — METRONIDAZOLE 500 MG/100ML IV SOLN
500.0000 mg | Freq: Once | INTRAVENOUS | Status: AC
  Administered 2022-06-03: 500 mg via INTRAVENOUS
  Filled 2022-06-03: qty 100

## 2022-06-03 MED ORDER — FENTANYL CITRATE (PF) 100 MCG/2ML IJ SOLN
INTRAMUSCULAR | Status: AC
  Filled 2022-06-03: qty 2

## 2022-06-03 MED ORDER — LACTATED RINGERS IV BOLUS
500.0000 mL | Freq: Once | INTRAVENOUS | Status: AC
  Administered 2022-06-03: 500 mL via INTRAVENOUS

## 2022-06-03 MED ORDER — ACETAMINOPHEN 500 MG PO TABS
1000.0000 mg | ORAL_TABLET | Freq: Three times a day (TID) | ORAL | Status: DC
  Administered 2022-06-03 – 2022-06-09 (×17): 1000 mg via ORAL
  Filled 2022-06-03 (×18): qty 2

## 2022-06-03 MED ORDER — SODIUM CHLORIDE 0.9 % IV SOLN
INTRAVENOUS | Status: AC | PRN
  Administered 2022-06-03: 250 mL via INTRAVENOUS

## 2022-06-03 MED ORDER — MIDAZOLAM HCL 2 MG/2ML IJ SOLN
INTRAMUSCULAR | Status: DC | PRN
  Administered 2022-06-03: .5 mg via INTRAVENOUS

## 2022-06-03 MED ORDER — ACETAMINOPHEN 325 MG PO TABS
650.0000 mg | ORAL_TABLET | ORAL | Status: DC | PRN
  Administered 2022-06-05 – 2022-06-11 (×2): 650 mg via ORAL
  Filled 2022-06-03 (×2): qty 2

## 2022-06-03 MED ORDER — FUROSEMIDE 10 MG/ML IJ SOLN
40.0000 mg | Freq: Once | INTRAMUSCULAR | Status: AC
  Administered 2022-06-03: 40 mg via INTRAVENOUS
  Filled 2022-06-03: qty 4

## 2022-06-03 MED ORDER — SODIUM CHLORIDE 0.9% FLUSH
3.0000 mL | INTRAVENOUS | Status: DC | PRN
  Administered 2022-06-05: 3 mL via INTRAVENOUS

## 2022-06-03 MED ORDER — SODIUM CHLORIDE 0.9% FLUSH
3.0000 mL | Freq: Two times a day (BID) | INTRAVENOUS | Status: DC
  Administered 2022-06-03 – 2022-06-12 (×17): 3 mL via INTRAVENOUS

## 2022-06-03 MED ORDER — SODIUM CHLORIDE 0.9 % IV SOLN
250.0000 mL | INTRAVENOUS | Status: DC
  Administered 2022-06-03: 250 mL via INTRAVENOUS

## 2022-06-03 MED ORDER — HEPARIN (PORCINE) IN NACL 1000-0.9 UT/500ML-% IV SOLN
INTRAVENOUS | Status: DC | PRN
  Administered 2022-06-03: 500 mL

## 2022-06-03 MED ORDER — VANCOMYCIN HCL IN DEXTROSE 1-5 GM/200ML-% IV SOLN: 1000.0000 mg | Freq: Once | INTRAVENOUS | Status: DC

## 2022-06-03 MED ORDER — ONDANSETRON HCL 4 MG/2ML IJ SOLN: 4.0000 mg | Freq: Four times a day (QID) | INTRAMUSCULAR | Status: DC | PRN

## 2022-06-03 MED ORDER — FENTANYL CITRATE (PF) 100 MCG/2ML IJ SOLN
INTRAMUSCULAR | Status: DC | PRN
  Administered 2022-06-03: 12.5 ug via INTRAVENOUS

## 2022-06-03 MED ORDER — LACTATED RINGERS IV BOLUS
1000.0000 mL | Freq: Once | INTRAVENOUS | Status: AC
  Administered 2022-06-03: 1000 mL via INTRAVENOUS

## 2022-06-03 MED ORDER — COLCHICINE 0.6 MG PO TABS
0.6000 mg | ORAL_TABLET | Freq: Two times a day (BID) | ORAL | Status: DC
  Administered 2022-06-03 – 2022-06-10 (×15): 0.6 mg via ORAL
  Filled 2022-06-03 (×15): qty 1

## 2022-06-03 MED ORDER — LABETALOL HCL 5 MG/ML IV SOLN: 10.0000 mg | INTRAVENOUS | Status: AC | PRN

## 2022-06-03 MED ORDER — MIDAZOLAM HCL 2 MG/2ML IJ SOLN
INTRAMUSCULAR | Status: AC
  Filled 2022-06-03: qty 2

## 2022-06-03 MED ORDER — VANCOMYCIN HCL 750 MG/150ML IV SOLN
750.0000 mg | INTRAVENOUS | Status: DC
  Administered 2022-06-04: 750 mg via INTRAVENOUS
  Filled 2022-06-03: qty 150

## 2022-06-03 MED ORDER — SODIUM CHLORIDE 0.9 % IV SOLN
2.0000 g | Freq: Once | INTRAVENOUS | Status: AC
  Administered 2022-06-03: 2 g via INTRAVENOUS
  Filled 2022-06-03: qty 12.5

## 2022-06-03 MED ORDER — HEPARIN SODIUM (PORCINE) 1000 UNIT/ML IJ SOLN
INTRAMUSCULAR | Status: AC
  Filled 2022-06-03: qty 10

## 2022-06-03 MED ORDER — ONDANSETRON HCL 4 MG/2ML IJ SOLN
INTRAMUSCULAR | Status: AC
  Administered 2022-06-03: 4 mg via INTRAVENOUS
  Filled 2022-06-03: qty 2

## 2022-06-03 MED ORDER — CHLORHEXIDINE GLUCONATE CLOTH 2 % EX PADS
6.0000 | MEDICATED_PAD | Freq: Every day | CUTANEOUS | Status: DC
  Administered 2022-06-04 – 2022-06-07 (×5): 6 via TOPICAL

## 2022-06-03 MED ORDER — SODIUM CHLORIDE 0.9 % IV SOLN
2.0000 g | INTRAVENOUS | Status: DC
  Administered 2022-06-04: 2 g via INTRAVENOUS
  Filled 2022-06-03: qty 12.5

## 2022-06-03 MED ORDER — HYDRALAZINE HCL 20 MG/ML IJ SOLN: 10.0000 mg | INTRAMUSCULAR | Status: AC | PRN

## 2022-06-03 SURGICAL SUPPLY — 9 items
KIT HEART LEFT (KITS) IMPLANT
KIT MICROPUNCTURE NIT STIFF (SHEATH) IMPLANT
PACK CARDIAC CATHETERIZATION (CUSTOM PROCEDURE TRAY) IMPLANT
SHEATH PROBE COVER 6X72 (BAG) IMPLANT
TRANSDUCER W/STOPCOCK (MISCELLANEOUS) IMPLANT
TRAY PERICARDIOCENTESIS 6FX60 (TRAY / TRAY PROCEDURE) IMPLANT
WIRE HI TORQ VERSACORE-J 145CM (WIRE) IMPLANT
WIRE MICRO SET SILHO 5FR 7 (SHEATH) IMPLANT
WIRE MICROINTRODUCER 60CM (WIRE) IMPLANT

## 2022-06-03 NOTE — Interval H&P Note (Signed)
History and Physical Interval Note:  06/03/2022 10:37 AM  Sheena Taylor Height  has presented today for surgery, with the diagnosis of PERICARDIAL EFFUSION.  The various methods of treatment have been discussed with the patient and family. After consideration of risks, benefits and other options for treatment, the patient has consented to  Procedure(s): PERICARDIOCENTESIS (N/A) as a surgical intervention.  The patient's history has been reviewed, patient examined, no change in status, stable for surgery.  I have reviewed the patient's chart and labs.  Questions were answered to the patient's satisfaction.     [place]

## 2022-06-03 NOTE — ED Notes (Signed)
Echo at bedside

## 2022-06-03 NOTE — Progress Notes (Signed)
   06/03/22 1005  Spiritual Encounters  Type of Visit Initial  Care provided to: Patient  Referral source Code page  Reason for visit Code  OnCall Visit Yes  Advance Directives (For Healthcare)  Does Patient Have a Medical Advance Directive? Yes  Type of Paramedic of Echo;Living will  [place]  Does Patient Have a Mental Health Advance Directive? No   Ch responded to code STEMI. Family was present. No follow-up needed at this time.

## 2022-06-03 NOTE — ED Provider Notes (Signed)
[facility] Provider Note   CSN: [id number] Arrival date & time: 06/03/22  [id number]     History  Chief Complaint  Patient presents with   Palpitations    Sheena Taylor is a 88 y.o. female.  HPI 88 year old female presents with near syncope.  History is from patient and later the husband.  She has been feeling some left chest pain and left upper back/shoulder pain that she thinks is from pushing wheelchairs.  However last night/this morning she went to the bathroom and felt lightheaded like she was going to pass out.  She is also been feeling on and off palpitations and high heart rates from A-fib over the last week.  She reports compliance with her meds including her Eliquis.  She denies any leg swelling.  She had a cough but no fevers.  She does currently feel short of breath.  No recent vomiting, diarrhea, etc.  EMS found her to be quite tachycardic and gave her 10 mg of Cardizem.  Home Medications Prior to Admission medications   Medication Sig Start Date End Date Taking? Authorizing Provider  acetaminophen (TYLENOL) 500 MG tablet Take 2 tablets (1,000 mg total) by mouth every 8 (eight) hours. 01/28/19   Danae Orleans, PA-C  apixaban (ELIQUIS) 5 MG TABS tablet Take 1 tablet (5 mg total) by mouth 2 (two) times daily. 12/20/21   Custovic, Sabina, DO  ELIQUIS 5 MG TABS tablet TAKE 1 TABLET BY MOUTH TWICE A DAY 03/07/21   Cantwell, Celeste C, PA-C  hydrochlorothiazide (HYDRODIURIL) 25 MG tablet Take 25 mg by mouth daily.    [provider]  lisinopril (ZESTRIL) 20 MG tablet TAKE 1 TABLET BY MOUTH EVERY DAY 04/20/22   Custovic, Collene Mares, DO  Multiple Vitamins-Minerals (MULTIVITAMIN WITH MINERALS) tablet Take 1 tablet by mouth daily.    [provider]  Multiple Vitamins-Minerals (OCUVITE PRESERVISION PO) Take 1 tablet by mouth 2 (two) times daily.    [provider]  pravastatin (PRAVACHOL) 20 MG tablet Take 20 mg by  mouth daily.    [provider]  vitamin C (ASCORBIC ACID) 500 MG tablet Take 500 mg by mouth daily after breakfast.    [provider]      Allergies    Codeine    Review of Systems   Review of Systems  Constitutional:  Negative for fever.  Respiratory:  Positive for cough and shortness of breath.   Cardiovascular:  Positive for chest pain.  Musculoskeletal:  Positive for back pain.  Neurological:  Positive for light-headedness. Negative for syncope and headaches.    Physical Exam Updated Vital Signs BP 99/74   Pulse (!) 137   Temp 97.7 F (36.5 C) (Oral)   Resp (!) 21   Wt 69.9 kg   SpO2 99%   BMI 26.43 kg/m  Physical Exam Vitals and nursing note reviewed. Exam conducted with a chaperone present.  Constitutional:      General: She is not in acute distress.    Appearance: She is well-developed. She is not ill-appearing or diaphoretic.  HENT:     Head: Normocephalic and atraumatic.  Cardiovascular:     Rate and Rhythm: Tachycardia present. Rhythm irregular.     Heart sounds: Normal heart sounds.  Pulmonary:     Effort: Pulmonary effort is normal.     Breath sounds: Normal breath sounds.  Chest:    Abdominal:     General: There is no distension.  Palpations: Abdomen is soft.     Tenderness: There is no abdominal tenderness.  Musculoskeletal:       Back:  Skin:    General: Skin is warm and dry.  Neurological:     Mental Status: She is alert.     ED Results / Procedures / Treatments   Labs (all labs ordered are listed, but only abnormal results are displayed) Labs Reviewed  COMPREHENSIVE METABOLIC PANEL - Abnormal; Notable for the following components:      Result Value   Sodium 133 (*)    CO2 19 (*)    Glucose, Bld 114 (*)    BUN 33 (*)    Creatinine, Ser 1.26 (*)    Calcium 8.7 (*)    Total Protein 6.0 (*)    Albumin 2.6 (*)    GFR, Estimated 42 (*)    Anion gap 16 (*)    All other components within normal limits  CBC WITH  DIFFERENTIAL/PLATELET - Abnormal; Notable for the following components:   WBC 13.1 (*)    RBC 3.22 (*)    Hemoglobin 9.7 (*)    HCT 30.9 (*)    Neutro Abs 9.7 (*)    Monocytes Absolute 1.5 (*)    Abs Immature Granulocytes 0.10 (*)    All other components within normal limits  LACTIC ACID, PLASMA - Abnormal; Notable for the following components:   Lactic Acid, Venous 2.9 (*)    All other components within normal limits  LACTIC ACID, PLASMA - Abnormal; Notable for the following components:   Lactic Acid, Venous 2.0 (*)    All other components within normal limits  RESP PANEL BY RT-PCR (RSV, FLU A&B, COVID)  RVPGX2  CULTURE, BLOOD (ROUTINE X 2)  CULTURE, BLOOD (ROUTINE X 2)  MRSA NEXT GEN BY PCR, NASAL  BODY FLUID CULTURE W GRAM STAIN  MAGNESIUM  URINALYSIS, W/ REFLEX TO CULTURE (INFECTION SUSPECTED)  BODY FLUID CELL COUNT WITH DIFFERENTIAL  GLUCOSE, BODY FLUID OTHER            PROTEIN, BODY FLUID (OTHER)  LD, BODY FLUID (OTHER)  POC OCCULT BLOOD, ED  CYTOLOGY - NON PAP  TROPONIN I (HIGH SENSITIVITY)  TROPONIN I (HIGH SENSITIVITY)    EKG EKG Interpretation  Date/Time:  Sunday June 03 2022 07:03:25 EST Ventricular Rate:  131 PR Interval:    QRS Duration: 70 QT Interval:  368 QTC Calculation: 544 R Axis:   30 Text Interpretation: Atrial fibrillation Low voltage, extremity and precordial leads Prolonged QT interval ST changes seem improved compared to 2020 Confirmed by Sherwood Gambler [phone]) on 06/03/2022 7:21:48 AM  Radiology DG Chest Portable 1 View  Result Date: 06/03/2022 CLINICAL DATA:  Atrial fibrillation.  Near syncope. EXAM: PORTABLE CHEST 1 VIEW COMPARISON:  None Available. FINDINGS: There is cardiac enlargement. No signs of pleural effusion or edema. No airspace opacities identified. The visualized osseous structures are unremarkable. IMPRESSION: Cardiac enlargement.  No acute abnormality. Electronically Signed   By: Kerby Moors M.D.   On: 06/03/2022 07:43     Procedures .Critical Care  Performed by: Sherwood Gambler, MD Authorized by: Sherwood Gambler, MD   Critical care provider statement:    Critical care time (minutes):  50   Critical care time was exclusive of:  Separately billable procedures and treating other patients   Critical care was necessary to treat or prevent imminent or life-threatening deterioration of the following conditions:  Circulatory failure and cardiac failure   Critical care was time spent  personally by me on the following activities:  Development of treatment plan with patient or surrogate, discussions with consultants, evaluation of patient's response to treatment, examination of patient, ordering and review of laboratory studies, ordering and review of radiographic studies, ordering and performing treatments and interventions, pulse oximetry, re-evaluation of patient's condition and review of old charts     Medications Ordered in ED Medications  0.9 %  sodium chloride infusion (0 mLs Intravenous Stopped 06/03/22 1004)  norepinephrine (LEVOPHED) 57m in 2566m(0.016 mg/mL) premix infusion ( Intravenous MAR Hold 06/03/22 1042)  vancomycin (VANCOREADY) IVPB 1500 mg/300 mL ( Intravenous MAR Hold 06/03/22 1042)  ceFEPIme (MAXIPIME) 2 g in sodium chloride 0.9 % 100 mL IVPB ( Intravenous Automatically Held 06/12/22 0900)  vancomycin (VANCOREADY) IVPB 750 mg/150 mL ( Intravenous Automatically Held 06/12/22 0900)  Heparin (Porcine) in NaCl 1000-0.9 UT/500ML-% SOLN (500 mLs  Given 06/03/22 1037)  lidocaine (PF) (XYLOCAINE) 1 % injection (15 mLs  Given 06/03/22 1043)  0.9 %  sodium chloride infusion (250 mLs Intravenous New Bag/Given 06/03/22 1100)  midazolam (VERSED) injection (0.5 mg Intravenous Given 06/03/22 1100)  fentaNYL (SUBLIMAZE) injection (12.5 mcg Intravenous Given 06/03/22 1100)  lactated ringers bolus 500 mL (0 mLs Intravenous Stopped 06/03/22 0804)  lactated ringers bolus 500 mL (0 mLs Intravenous Stopped 06/03/22 0754)   ondansetron (ZOFRAN) injection 4 mg (4 mg Intravenous Given 06/03/22 0753)  ceFEPIme (MAXIPIME) 2 g in sodium chloride 0.9 % 100 mL IVPB (0 g Intravenous Stopped 06/03/22 0858)  metroNIDAZOLE (FLAGYL) IVPB 500 mg (0 mg Intravenous Stopped 06/03/22 0937)  lactated ringers bolus 1,000 mL (0 mLs Intravenous Stopped 06/03/22 [id number]   ED Course/ Medical Decision Making/ A&P Clinical Course as of 06/03/22 1158  Sun Jun 03, 2022  0730 Patient's blood pressure has now dropped into the 70s and 80s.  She is feeling a little more lightheaded/symptomatic.  However her heart rate is not that bad, max of around 115 on the monitor.  Will hold the Cardizem and give fluids.  She denies any type of infectious symptoms besides a mild cough and I do not think this is sepsis at this time. [SG]  [id number] remains hypotensive despite almost 500 cc IV fluid bolus.  She will be given another 500 cc for 1 L total, and this will be applied pressure bag.  Pressure is coming up somewhat.  She remains in A-fib though only mildly tachycardic and husband notes that he thinks she is in A-fib nearly all the time.  Given all this, I think cardioversion would not help as I do not know that this is all A-fib related.  This may be more volume loss/dehydration from poor p.o. intake but the same time is unclear if this might be sepsis and so we will start sepsis protocol and get cultures, antibiotics, etc. [SG]    Clinical Course User Index [SG] GoSherwood GamblerMD                             Medical Decision Making Amount and/or Complexity of Data Reviewed Independent Historian: spouse Labs: ordered.    Details: Lack date is elevated at 2.9 but improved to 2.0.  Mild anemia which is similar to a couple years ago, no labs in between.  Normal troponin. Radiology: ordered and independent interpretation performed.    Details: Cardiac enlargement but no CHF ECG/medicine tests: ordered and independent interpretation performed.     Details: Atrial fibrillation  but no ischemia  Risk Prescription drug management. Decision regarding hospitalization.   Patient presents with near syncope and weakness.  She is found to be hypotensive here and was requiring IV fluids.  I ordered peripheral pressors but after the fluids she is actually improved enough that she has not required these yet.  I do not think this is primarily coming from the A-fib and ultimately discussed with cardiology Dr. Virgina Jock.  Given the unclear source of her illness, she had originally been started on some antibiotics but after cards eval it seems like this is more of a pericardial effusion.  She does not have clear tamponade during this bedside echo but at the same time clinically she seems to have shock and so he will take her for an urgent pericardiocentesis.  Otherwise she is urgently transferred to the Cath Lab.        Final Clinical Impression(s) / ED Diagnoses Final diagnoses:  Pericardial effusion    Rx / DC Orders ED Discharge Orders     None         Sherwood Gambler, MD 06/03/22 1200

## 2022-06-03 NOTE — ED Notes (Addendum)
Cath lab activated for large pericardial effusion. Not a code stemi.

## 2022-06-03 NOTE — ED Notes (Signed)
Critical Lactic of 2.9 called from lab. Regenia Skeeter MD notified.

## 2022-06-03 NOTE — ED Notes (Signed)
Phlebotomy at bedside obtaining blood cultures

## 2022-06-03 NOTE — ED Triage Notes (Signed)
Patient BIB [facility] for shortness of breath, chest pain and afib RVR. Per EMS, patient has been having L sided chest pain that radiates towards her shoulder and back for the past week. With this, she states that she has been having exertional shortness of breath. This morning, patient woke up feeling more short of breath and 911 was called. EMS arrived and found patient in Afib RVR. Has had one episode in the past, was seen by cardiology and given Eliquis but no rate control medication. HR with EMS was 150s-160s, 10 of Cardizem was given IV.

## 2022-06-03 NOTE — Progress Notes (Signed)
Lactic acid trending down. Post pericardiocentesis CXR shows vascular congestion. She did receive 2 L fluids in ER. Will give IV lasix 40 mg now and reassess the need later. Afib RVR remains. Given normal LVEF, started diltiazem drip.   Nigel Mormon, MD Pager: [phone] Office: [phone]

## 2022-06-03 NOTE — Sepsis Progress Note (Signed)
Elink following code sepsis °

## 2022-06-03 NOTE — Progress Notes (Signed)
Pharmacy Antibiotic Note  Sheena Taylor is a 88 y.o. female admitted on 06/03/2022 with sepsis with shortness of breath and chest pain. Now on vasopressor support  Pharmacy has been consulted for vancomycin and cefepime dosing. Flagyl per MD  Historical weight 70kg; height 64 inches in 03/15/2022.. Scr 1.26 > CrCl ~39m/min   Plan: Vancomycin 15018mIV x 1 followed by vancomycin 75051m 24h (goal 15-39m13mL)  Cefepime 2 g IV q24h F/u renal func for dosing adjustments, current weight (used historical for dosing)  F/u culture data , clinical progress     Temp (24hrs), Avg:97.7 F (36.5 C), Min:97.7 F (36.5 C), Max:97.7 F (36.5 C)  Recent Labs  Lab 06/03/22 0703  WBC 13.1*  CREATININE 1.26*    CrCl cannot be calculated (Unknown ideal weight.).    Allergies  Allergen Reactions   Codeine Nausea And Vomiting    Antimicrobials this admission: 2/18 cefepime> 2/18 vanc>   Dose adjustments this admission:   Microbiology results: 2/18 Bcx 2/18 UA/Ucx 2/18 MRSA PCR 2/18 RVP   Thank you for allowing pharmacy to be a part of this patient's care.  MadeWilson SingerarmD Clinical Pharmacist 06/03/2022 8:39 AM

## 2022-06-03 NOTE — ED Notes (Signed)
Dr Regenia Skeeter at bedside after being informed of low bp. Pt remains AxOx4. Currently holding off on the cardizem

## 2022-06-03 NOTE — H&P (Addendum)
Sheena Taylor is an 88 y.o. female.   Chief Complaint: Shortness of breath  Referring Physician: [name] Reason for Consultation:  Afib  HPI:   88 y.o. Caucasian female with hypertension, arthritis, paroxysmal atrial fibrillation.   Patient had been feeling poorly for last several days. It started with cough a few weeks ago, followed by chest, upper back pain and shoulder pain for 1-2 weeks. This morning, she got very lightheaded, which prompted ER visit.   In the ER, she is short of breath, feels unwell. She has been borderline hypotensive around 90-100/60-70 in spite of 2 L fluids, is in Afib w/RVR in 140-150. She denies fever, chills, nausea, vomiting, diarrhea.    Bedside ultrasound by me showed large circumferential pericardial effusion. Lactic acid elevated at 2.9.  Past Medical History:  Diagnosis Date   Arthritis    Atrial fibrillation (HCC)    Complication of anesthesia    Dr. 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   History of laparotomy    for ectopic pregnancy   Hypertension    Macular degeneration    Pneumonia    as a child   S/P bunionectomy    bilateral and hammer toe-correction    Past Surgical History:  Procedure Laterality Date   ABDOMINAL HYSTERECTOMY     partial   BREAST EXCISIONAL BIOPSY Left 1992   BREAST EXCISIONAL BIOPSY Right 1992   BUNIONECTOMY     bilateral with hammer toe and amputation 2nd right toe with this   CATARACT EXTRACTION Left    LAPAROTOMY     for ruptured ectopic pregnancy   TOTAL KNEE ARTHROPLASTY Right 01/17/2015   Procedure: TOTAL RIGHT KNEE ARTHROPLASTY;  Surgeon: Paralee Cancel, MD;  Location: [facility];  Service: Orthopedics;  Laterality: Right;   TOTAL KNEE ARTHROPLASTY Left 01/27/2019   Procedure: TOTAL KNEE  ARTHROPLASTY;  Surgeon: Paralee Cancel, MD;  Location: [facility];  Service: Orthopedics;  Laterality: Left;  70 mins   VARICOSE VEIN SURGERY     bilateral     Family History  Problem Relation Age of Onset   Heart disease Mother    Breast cancer Niece        twice    Social History:  reports that she has been smoking cigarettes. She has never used smokeless tobacco. She reports current alcohol use. She reports that she does not use drugs.  Allergies:  Allergies  Allergen Reactions   Codeine Nausea And Vomiting    ROS Constitutional: Positive for malaise/fatigue.  Cardiovascular:  Positive for chest pain. Negative for dyspnea on exertion, leg swelling, palpitations and syncope.  Respiratory:  Positive for shortness of breath.    Blood pressure 93/73, pulse (!) 144, temperature 97.7 F (36.5 C), temperature source Oral, resp. rate (!) 28, weight 69.9 kg, SpO2 100 %. Body mass index is 26.43 kg/m.   Physical Exam Vitals and nursing note reviewed.  Constitutional:      General: She is not in acute distress.    Appearance: She is ill-appearing.  Neck:     Vascular: JVD present.  Cardiovascular:     Rate and Rhythm: Tachycardia present. Rhythm irregular.     Heart sounds: Heart sounds are distant. No  murmur heard. Pulmonary:     Effort: Respiratory distress present.     Breath sounds: Normal breath sounds. No wheezing or rales.  Musculoskeletal:     Right lower leg: Edema (Trace) present.     Left lower leg: Edema (Trace) present.   (Not in a hospital admission)     Current Facility-Administered Medications:    0.9 %  sodium chloride infusion, 250 mL, Intravenous, Continuous, Sherwood Gambler, MD, Stopped at 06/03/22 1004   [START ON 06/04/2022] ceFEPIme (MAXIPIME) 2 g in sodium chloride 0.9 % 100 mL IVPB, 2 g, Intravenous, Q24H, Wilson Singer I, RPH   norepinephrine (LEVOPHED) 52m in 2559m(0.016 mg/mL) premix infusion, 2-10 mcg/min, Intravenous, Titrated, GoSherwood GamblerMD, Held at 06/03/22 0840   vancomycin (VANCOREADY) IVPB 1500 mg/300 mL, 1,500 mg, Intravenous, Once, MiWilson Singer, RPH   [START ON 06/04/2022] vancomycin (VANCOREADY) IVPB 750 mg/150 mL, 750 mg, Intravenous, Q24H, MiWilson Singer, RPStraith Hospital For Special SurgeryCurrent Outpatient Medications:    acetaminophen (TYLENOL) 500 MG tablet, Take 2 tablets (1,000 mg total) by mouth every 8 (eight) hours., Disp: 30 tablet, Rfl: 0   apixaban (ELIQUIS) 5 MG TABS tablet, Take 1 tablet (5 mg total) by mouth 2 (two) times daily., Disp: 60 tablet, Rfl: 6   ELIQUIS 5 MG TABS tablet, TAKE 1 TABLET BY MOUTH TWICE A DAY, Disp: 180 tablet, Rfl: 2   hydrochlorothiazide (HYDRODIURIL) 25 MG tablet, Take 25 mg by mouth daily., Disp: , Rfl:    lisinopril (ZESTRIL) 20 MG tablet, TAKE 1 TABLET BY MOUTH EVERY DAY, Disp: 90 tablet, Rfl: 2   Multiple Vitamins-Minerals (MULTIVITAMIN WITH MINERALS) tablet, Take 1 tablet by mouth daily., Disp: , Rfl:    Multiple Vitamins-Minerals (OCUVITE PRESERVISION PO), Take 1 tablet by mouth 2 (two) times daily., Disp: , Rfl:    pravastatin (PRAVACHOL) 20 MG tablet, Take 20 mg by mouth daily., Disp: , Rfl:    vitamin C (ASCORBIC ACID) 500 MG tablet, Take 500 mg by mouth daily after breakfast., Disp: , Rfl:    Today's Vitals   06/03/22 0900 06/03/22 0915 06/03/22 0945 06/03/22 1000  BP: 101/65 100/65 94/72 93/73 $  Pulse: (!) 138 (!) 128 (!) 145 (!) 144  Resp: 19 (!) 26 (!) 32 (!) 28  Temp:      TempSrc:      SpO2: 99% 98% 100% 100%  Weight:       Body mass index is 26.43 kg/m.   Lab Results: Reviewed and interpreted: CBC, BMP, lactic acid  CXR 06/03/2022:  FINDINGS: There is cardiac enlargement. No signs of pleural effusion or edema. No airspace opacities identified. The visualized osseous structures are unremarkable.   IMPRESSION: Cardiac enlargement.  No acute abnormality.  Tests ordered:  Lab Orders         Resp panel by RT-PCR (RSV, Flu A&B, Covid) Anterior Nasal Swab          Culture, blood (routine x 2)         MRSA Next Gen by PCR, Nasal         Comprehensive metabolic panel         CBC with Differential         Magnesium         Lactic acid, plasma         Urinalysis, w/ Reflex to Culture (Infection Suspected) -Urine, Clean Catch         POC occult blood, ED       Cardiac Studies:  Afib w/RVR in 140s   EKG 06/03/2022: Afib w/RVR No acute ischemic changes   Echocardiogram 06/03/2022: Moderate to large pericardial effusion Few septae seen Largest pocket apical and posterior IVC dilated and not collapsing Mitral and tricuspid inflow w/increased respiratory variation   Echocardiogram 2020:  1. The left ventricle has hyperdynamic systolic function, with an  ejection fraction of >65%. The cavity size was normal. Left ventricular  diastolic Doppler parameters are consistent with impaired relaxation. No  evidence of left ventricular regional wall   motion abnormalities.   2. The right ventricle has normal systolic function. The cavity was  normal. There is no increase in right ventricular wall thickness.   3. Mild ot moderate mitral regurgitation.   4. Mild tricuspid regurgitation. PASP 23 mmhg.      Assessment & Recommendations:   88 y.o. Caucasian female with hypertension, arthritis, paroxysmal atrial fibrillation, large pericardial effusion   Pericardial effusion: Hypotensive, elevated lactic acid s/o mild cardiogenic shock Effusion could be complex Plethoric IVC, increased respiratory variation of mitral and tricuspid inflow. Clinical and echocardiographic signs of cardiac tamponade, even though effusion is not the largest.   She is on eliquis, which increases bleeding risk, but benefits of pericardiocentesis outweigh the risks- which include, but not limited to, increased bleeding risk., infection, injury to the heart, lung, liver, vessels, arrhythmia, pneumothorax, death.  Patient and husband understand the risks and agree with  proceeding.    Etiology most likely pericarditis. Will determine further after pericardiocentesis.  May consider MRI down the road.    Hold Eliquis. Last dose on 2/17 8 PM. In the event of major bleeding complication, will use reversal agent. I would hold off using at this time given increased risk of thromboembolic events with its use.    Afib w/RVR: Baseline Afib, RVR due to cardiac tamponade.   Discussed interpretation of tests and management recommendations with the primary team   CRITICAL CARE Performed by: Vernell Leep   Total critical care time: 45 minutes   Critical care time was exclusive of separately billable procedures and treating other patients.   Critical care was necessary to treat or prevent imminent or life-threatening deterioration.   Critical care was time spent personally by me on the following activities: development of treatment plan with patient and/or surrogate as well as nursing, discussions with consultants, evaluation of patient's response to treatment, examination of patient, obtaining history from patient or surrogate, ordering and performing treatments and interventions, ordering and review of laboratory studies, ordering and review of radiographic studies, pulse oximetry and re-evaluation of patient's condition.    Nigel Mormon, MD Pager: [phone] Office: [phone]

## 2022-06-03 NOTE — ED Notes (Signed)
Cardiologist at bed side

## 2022-06-03 NOTE — Progress Notes (Signed)
PIV consult for vasopressor: B forearms assessed with [place]. Veins too small for cannulation. B AC sites infusing. Recommend central line if prolonged vasoactive infusions are required.

## 2022-06-03 NOTE — ED Notes (Signed)
Pt to cathlab with this rn. Pt on full cardiac monitor and zoll. Pt AxOx4.

## 2022-06-04 ENCOUNTER — Other Ambulatory Visit (HOSPITAL_COMMUNITY): Payer: Medicare Other

## 2022-06-04 ENCOUNTER — Inpatient Hospital Stay (HOSPITAL_COMMUNITY): Payer: Medicare Other

## 2022-06-04 ENCOUNTER — Encounter (HOSPITAL_COMMUNITY): Payer: Self-pay | Admitting: Cardiology

## 2022-06-04 DIAGNOSIS — I3139 Other pericardial effusion (noninflammatory): Secondary | ICD-10-CM

## 2022-06-04 DIAGNOSIS — J90 Pleural effusion, not elsewhere classified: Secondary | ICD-10-CM | POA: Diagnosis not present

## 2022-06-04 LAB — ECHOCARDIOGRAM LIMITED
Area-P 1/2: 4.93 cm2
Calc EF: 61.8 %
Height: 64 in
MV M vel: 4.09 m/s
MV Peak grad: 66.9 mmHg
S' Lateral: 2.5 cm
Single Plane A2C EF: 65.6 %
Single Plane A4C EF: 59.2 %
Weight: 2464 oz
Weight: 2464 oz

## 2022-06-04 LAB — BASIC METABOLIC PANEL
Anion gap: 17 — ABNORMAL HIGH (ref 5–15)
BUN: 38 mg/dL — ABNORMAL HIGH (ref 8–23)
CO2: 22 mmol/L (ref 22–32)
Calcium: 8.9 mg/dL (ref 8.9–10.3)
Chloride: 95 mmol/L — ABNORMAL LOW (ref 98–111)
Creatinine, Ser: 1.32 mg/dL — ABNORMAL HIGH (ref 0.44–1.00)
GFR, Estimated: 40 mL/min — ABNORMAL LOW (ref >60)
Glucose, Bld: 109 mg/dL — ABNORMAL HIGH (ref 70–99)
Potassium: 3.8 mmol/L (ref 3.5–5.1)
Sodium: 134 mmol/L — ABNORMAL LOW (ref 135–145)

## 2022-06-04 LAB — CBC
HCT: 30.5 % — ABNORMAL LOW (ref 36.0–46.0)
Hemoglobin: 10.4 g/dL — ABNORMAL LOW (ref 12.0–15.0)
MCH: 31 pg (ref 26.0–34.0)
MCHC: 34.1 g/dL (ref 30.0–36.0)
MCV: 90.8 fL (ref 80.0–100.0)
Platelets: 385 10*3/uL (ref 150–400)
RBC: 3.36 MIL/uL — ABNORMAL LOW (ref 3.87–5.11)
RDW: 13.4 % (ref 11.5–15.5)
WBC: 11.8 10*3/uL — ABNORMAL HIGH (ref 4.0–10.5)
nRBC: 0 % (ref 0.0–0.2)

## 2022-06-04 MED ORDER — MELATONIN 5 MG PO TABS
5.0000 mg | ORAL_TABLET | Freq: Every evening | ORAL | Status: DC | PRN
  Administered 2022-06-04 – 2022-06-11 (×5): 5 mg via ORAL
  Filled 2022-06-04 (×5): qty 1

## 2022-06-04 MED ORDER — MORPHINE SULFATE (PF) 2 MG/ML IV SOLN
1.0000 mg | Freq: Once | INTRAVENOUS | Status: AC
  Administered 2022-06-04: 1 mg via INTRAVENOUS

## 2022-06-04 MED ORDER — DILTIAZEM HCL 60 MG PO TABS: 60.0000 mg | ORAL_TABLET | Freq: Three times a day (TID) | ORAL | Status: DC

## 2022-06-04 MED ORDER — DILTIAZEM HCL-DEXTROSE 125-5 MG/125ML-% IV SOLN (PREMIX)
5.0000 mg/h | INTRAVENOUS | Status: DC
  Administered 2022-06-04: 5 mg/h via INTRAVENOUS
  Administered 2022-06-05 – 2022-06-06 (×3): 15 mg/h via INTRAVENOUS
  Filled 2022-06-04 (×4): qty 125

## 2022-06-04 MED ORDER — GADOBUTROL 1 MMOL/ML IV SOLN
8.5000 mL | Freq: Once | INTRAVENOUS | Status: AC | PRN
  Administered 2022-06-04: 8.5 mL via INTRAVENOUS

## 2022-06-04 MED ORDER — MORPHINE SULFATE (PF) 2 MG/ML IV SOLN
INTRAVENOUS | Status: AC
  Filled 2022-06-04: qty 1

## 2022-06-04 MED FILL — Verapamil HCl IV Soln 2.5 MG/ML: INTRAVENOUS | Qty: 2 | Status: AC

## 2022-06-04 MED FILL — Lidocaine HCl Local Preservative Free (PF) Inj 1%: INTRAMUSCULAR | Qty: 30 | Status: AC

## 2022-06-04 NOTE — Progress Notes (Signed)
Subjective:  Pain improved   Current Facility-Administered Medications:    0.9 %  sodium chloride infusion, 250 mL, Intravenous, Continuous, Sherwood Gambler, MD, Stopped at 06/03/22 0821   0.9 %  sodium chloride infusion, 250 mL, Intravenous, PRN, Marriana Hibberd J, MD   acetaminophen (TYLENOL) tablet 1,000 mg, 1,000 mg, Oral, Q8H, Hadia Minier J, MD, 1,000 mg at 06/04/22 0607   acetaminophen (TYLENOL) tablet 650 mg, 650 mg, Oral, Q4H PRN, Cedar Roseman J, MD   ceFEPIme (MAXIPIME) 2 g in sodium chloride 0.9 % 100 mL IVPB, 2 g, Intravenous, Q24H, Mitchell, Madeline I, RPH   Chlorhexidine Gluconate Cloth 2 % PADS 6 each, 6 each, Topical, Daily, Charliene Inoue J, MD, 6 each at 06/04/22 0225   colchicine tablet 0.6 mg, 0.6 mg, Oral, BID, Anjolina Byrer J, MD, 0.6 mg at 06/03/22 2247   diltiazem (CARDIZEM) 125 mg in dextrose 5% 125 mL (1 mg/mL) infusion, 5-15 mg/hr, Intravenous, Continuous, Kaimana Lurz J, MD, Last Rate: 11 mL/hr at 06/04/22 0700, 11 mg/hr at 06/04/22 0700   ondansetron (ZOFRAN) injection 4 mg, 4 mg, Intravenous, Q6H PRN, Malaquias Lenker J, MD   Oral care mouth rinse, 15 mL, Mouth Rinse, PRN, Issiac Jamar J, MD   sodium chloride flush (NS) 0.9 % injection 3 mL, 3 mL, Intravenous, Q12H, Zan Triska J, MD, 3 mL at 06/03/22 2249   sodium chloride flush (NS) 0.9 % injection 3 mL, 3 mL, Intravenous, PRN, Yoseline Andersson J, MD   vancomycin (VANCOREADY) IVPB 1500 mg/300 mL, 1,500 mg, Intravenous, Once, Wilson Singer I, RPH   vancomycin (VANCOREADY) IVPB 750 mg/150 mL, 750 mg, Intravenous, Q24H, Wilson Singer I, RPH   Objective:  Vital Signs in the last 24 hours: Temp:  [97.4 F (36.3 C)-98 F (36.7 C)] 97.4 F (36.3 C) (02/18 2300) Pulse Rate:  [44-156] 102 (02/19 0700) Resp:  [17-32] 29 (02/19 0700) BP: (73-131)/(51-116) 102/60 (02/19 0600) SpO2:  [86 %-100 %] 90 % (02/19 0700) Weight:  [69.9 kg] 69.9 kg (02/18  0852)  Intake/Output from previous day: 02/18 0701 - 02/19 0700 In: 403.4 [P.O.:180; I.V.:223.4] Out: 1100 [Urine:1100]  Physical Exam Vitals and nursing note reviewed.  Constitutional:      General: She is not in acute distress. Neck:     Vascular: No JVD.  Cardiovascular:     Rate and Rhythm: Tachycardia present. Rhythm irregular.     Heart sounds: Normal heart sounds. No murmur heard. Pulmonary:     Effort: Pulmonary effort is normal.     Breath sounds: Transmitted upper airway sounds present. Decreased breath sounds (at bases) present. No wheezing or rales.      Imaging/tests reviewed and independently interpreted:  CXR 06/04/2022: COMPARISON:  Chest radiograph dated 06/03/2022   FINDINGS: Normal lung volumes. Patchy left retrocardiac opacities. No pleural effusion or pneumothorax. Similar enlarged cardiomediastinal silhouette. The visualized skeletal structures are unremarkable.   IMPRESSION: Patchy left retrocardiac opacities, which may represent atelectasis or infection.    Cardiac Studies:  Telemetry 06/04/2022: Afib, rate 90-110  EKG 06/03/2022: Atrial fibrillation w/RVR Low voltage, extremity and precordial leads Prolonged QT interval  Echocardiogram 06/04/2022: EF 55-60%. Septal flattening due to RVPO Mild MR, mod TR, mild PH Small posterior pericardial effusion  Pericardiocentesis 2/18: Ultrasound guided pericardiocentesis through apical access. 600 cc of hemorrhagic fluid drained.  Assessment & Recommendations:  88 y.o. Caucasian female with hypertension, arthritis, paroxysmal atrial fibrillation, large pericardial effusion   Pericardial effusion: Cardiac tamponade on 2/18, underwent pericardiocentesis w/600 cc hemorrhagic pericardial  fluid drained. No infectious findings. Cytology pending. No recurrent fluid today. Drain removed by me.  Continue to hold Eliquis for now. Will consider resuming next week. Will check cardiac MRI to look for  any clues for pericarditis.  Atelectasis: No s/s of pneumonia. Use incentive spirometry.  Afib: Rate 90-110. Stopped diltiazem drip this morning. Monitor for now. If needed, will add PO diltiazem.   CRITICAL CARE Performed by: Vernell Leep   Total critical care time: 35 minutes   Critical care time was exclusive of separately billable procedures and treating other patients.   Critical care was necessary to treat or prevent imminent or life-threatening deterioration.   Critical care was time spent personally by me on the following activities: development of treatment plan with patient and/or surrogate as well as nursing, discussions with consultants, evaluation of patient's response to treatment, examination of patient, obtaining history from patient or surrogate, ordering and performing treatments and interventions, ordering and review of laboratory studies, ordering and review of radiographic studies, pulse oximetry and re-evaluation of patient's condition.   Will transfer to telemetry.   Nigel Mormon, MD Pager: [phone] Office: [phone]

## 2022-06-05 DIAGNOSIS — I3139 Other pericardial effusion (noninflammatory): Secondary | ICD-10-CM

## 2022-06-05 LAB — LD, BODY FLUID (OTHER): LD, Body Fluid: 1368 IU/L

## 2022-06-05 LAB — CBC
HCT: 31.6 % — ABNORMAL LOW (ref 36.0–46.0)
Hemoglobin: 10.6 g/dL — ABNORMAL LOW (ref 12.0–15.0)
MCH: 30.5 pg (ref 26.0–34.0)
MCHC: 33.5 g/dL (ref 30.0–36.0)
MCV: 91.1 fL (ref 80.0–100.0)
Platelets: 426 10*3/uL — ABNORMAL HIGH (ref 150–400)
RBC: 3.47 MIL/uL — ABNORMAL LOW (ref 3.87–5.11)
RDW: 13.3 % (ref 11.5–15.5)
WBC: 15.1 10*3/uL — ABNORMAL HIGH (ref 4.0–10.5)
nRBC: 0 % (ref 0.0–0.2)

## 2022-06-05 LAB — BASIC METABOLIC PANEL
Anion gap: 12 (ref 5–15)
BUN: 33 mg/dL — ABNORMAL HIGH (ref 8–23)
CO2: 22 mmol/L (ref 22–32)
Calcium: 8.8 mg/dL — ABNORMAL LOW (ref 8.9–10.3)
Chloride: 98 mmol/L (ref 98–111)
Creatinine, Ser: 0.96 mg/dL (ref 0.44–1.00)
GFR, Estimated: 58 mL/min — ABNORMAL LOW (ref >60)
Glucose, Bld: 97 mg/dL (ref 70–99)
Potassium: 4 mmol/L (ref 3.5–5.1)
Sodium: 132 mmol/L — ABNORMAL LOW (ref 135–145)

## 2022-06-05 LAB — GLUCOSE, BODY FLUID OTHER: Glucose, Body Fluid Other: <2 mg/dL

## 2022-06-05 LAB — CYTOLOGY - NON PAP

## 2022-06-05 LAB — PROTEIN, BODY FLUID (OTHER): Total Protein, Body Fluid Other: 4.9 g/dL

## 2022-06-05 MED ORDER — DILTIAZEM HCL ER 90 MG PO CP12
90.0000 mg | ORAL_CAPSULE | Freq: Two times a day (BID) | ORAL | Status: DC
  Administered 2022-06-05 – 2022-06-06 (×3): 90 mg via ORAL
  Filled 2022-06-05 (×5): qty 1

## 2022-06-05 MED ORDER — IBUPROFEN 400 MG PO TABS
600.0000 mg | ORAL_TABLET | Freq: Three times a day (TID) | ORAL | Status: DC | PRN
  Administered 2022-06-07 – 2022-06-08 (×2): 600 mg via ORAL
  Filled 2022-06-05 (×2): qty 2
  Filled 2022-06-05: qty 1
  Filled 2022-06-05: qty 2

## 2022-06-05 NOTE — Care Management (Signed)
  Transition of Care [facility]) Screening Note   Patient Details  Name: PATSI HAEUSSLER Date of Birth: 05/07/1934   Transition of Care [facility]) CM/SW Contact:    Bethena Roys, RN Phone Number: 06/05/2022, 11:50 AM    Transition of Care Department [facility]) has reviewed the patient and no TOC needs have been identified at this time. Patient presented for shortness of breath and palpitations-Post pericardiocentesis that yielded 2 Liters. We will continue to monitor patient advancement through interdisciplinary progression rounds. If new patient transition needs arise, please place a TOC consult.

## 2022-06-05 NOTE — Consult Note (Cosign Needed Addendum)
[address]       [place], [address]             [place] Record [id number] Date of Birth: 1935/03/06  Referring: Dr. Virgina Jock, MD Primary Care: [name], MD Primary Cardiologist:None  Chief Complaint:    Chief Complaint  Patient presents with   Palpitations, chest/back/shoulder pain, SOB  Reason for consultation: Pericardial effusion  History of Present Illness:     This is an 88 year old female with a past medical history of atrial fibrillation, hypertension, macular degeneration, arthritis, bilateral vein stripping, depression, and tobacco abuse who presented to [name] ED on 06/03/2022 with shortness of breath, chest/upper back/shoulder pain, lightheadedness, and " just feeling poorly". She was fairly active until recently. She did have complaints of a cough but denies fever, chills, or sputum production. 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 This has become increasingly difficult. Initial labs showed creatinine slightly elevated at 1.26, Troponin (high sensitivity) all WNL, lactic acid 2.9 and WBC 13,100. CXR upon admission showed cardiac enlargement. Echo showed LVEF to be normal, moderate to large circumferential pericardial effusion with likely mixed material and focal strands , tamponade. She underwent a pericardiocentesis and 600 cc of hemorrhagic fluid was drained. MRI showed large, complex pericardial effusion and pericardial inflammation, normal systolic function (RF [id number]).  Of note, she was on Apixban prior to admission and this has not been restarted yet. Echo done yesterday showed LVEF 55-60%, mild to moderate MR, moderate TR, and a  small pericardial effusion is present. The pericardial effusion is posterior to the left ventricle. Cardiothoracic surgery has been consulted for consideration of possible pericardial window.    Current Activity/ Functional Status: Patient is independent with mobility/ambulation, transfers, ADL's, IADL's.   Zubrod Score: At the time of surgery this patient's most appropriate activity status/level should be described as: []$     0    Normal activity, no symptoms []$     1    Restricted in physical strenuous activity but ambulatory, able to do out light work [x]$     2    Ambulatory and capable of self care, unable to do work activities, up and about more than 50% of the time                            []$     3    Only limited self care, in bed greater than 50% of waking hours []$     4    Completely disabled, no self care, confined to bed or chair []$     5    Moribund  Past Medical History:  Diagnosis Date   Arthritis    Atrial fibrillation ([place])    Complication of anesthesia    Dr. 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   History of laparotomy    for ectopic pregnancy   Hypertension    Macular degeneration    Pneumonia    as a child   S/P bunionectomy  bilateral and hammer toe-correction    Past Surgical History:  Procedure Laterality Date   ABDOMINAL HYSTERECTOMY     partial   BREAST EXCISIONAL BIOPSY Left 1992   BREAST EXCISIONAL BIOPSY Right 1992   BUNIONECTOMY     bilateral with hammer toe and amputation 2nd right toe with this   CATARACT EXTRACTION Left    LAPAROTOMY     for ruptured ectopic pregnancy   PERICARDIOCENTESIS N/A 06/03/2022   Procedure: PERICARDIOCENTESIS;  Surgeon: Nigel Mormon, MD;  Location: [facility];  Service: Cardiovascular;  Laterality: N/A;   TOTAL KNEE ARTHROPLASTY Right 01/17/2015   Procedure: TOTAL RIGHT KNEE ARTHROPLASTY;  Surgeon: Paralee Cancel, MD;  Location: [facility];  Service: Orthopedics;  Laterality: Right;   TOTAL KNEE  ARTHROPLASTY Left 01/27/2019   Procedure: TOTAL KNEE ARTHROPLASTY;  Surgeon: Paralee Cancel, MD;  Location: [facility];  Service: Orthopedics;  Laterality: Left;  70 mins   VARICOSE VEIN SURGERY     bilateral    Social History   Tobacco Use  Smoking Status Light Smoker   Types: Cigarettes  Smokeless Tobacco Never  Tobacco Comments   smokes about once a week when having a glass of wine   She quit cigarettes 1-2 years ago Social History   Substance and Sexual Activity  Alcohol Use Yes   Comment: occassionally  wine     Allergies  Allergen Reactions   Codeine Nausea And Vomiting    Current Facility-Administered Medications  Medication Dose Route Frequency Provider Last Rate Last Admin   0.9 %  sodium chloride infusion  250 mL Intravenous Continuous Sherwood Gambler, MD   Stopped at 06/03/22 0821   0.9 %  sodium chloride infusion  250 mL Intravenous PRN Patwardhan, Manish J, MD       acetaminophen (TYLENOL) tablet 1,000 mg  1,000 mg Oral Q8H Patwardhan, Manish J, MD   1,000 mg at 06/05/22 0522   acetaminophen (TYLENOL) tablet 650 mg  650 mg Oral Q4H PRN Patwardhan, Manish J, MD       Chlorhexidine Gluconate Cloth 2 % PADS 6 each  6 each Topical Daily Patwardhan, Manish J, MD   6 each at 06/05/22 0916   colchicine tablet 0.6 mg  0.6 mg Oral BID Patwardhan, Manish J, MD   0.6 mg at 06/05/22 0915   diltiazem (CARDIZEM) 125 mg in dextrose 5% 125 mL (1 mg/mL) infusion  5-15 mg/hr Intravenous Continuous Patwardhan, Manish J, MD 15 mL/hr at 06/05/22 1000 15 mg/hr at 06/05/22 1000   melatonin tablet 5 mg  5 mg Oral QHS PRN Patwardhan, Reynold Bowen, MD   5 mg at 06/04/22 2120   ondansetron (ZOFRAN) injection 4 mg  4 mg Intravenous Q6H PRN Patwardhan, Manish J, MD       Oral care mouth rinse  15 mL Mouth Rinse PRN Patwardhan, Manish J, MD       sodium chloride flush (NS) 0.9 % injection 3 mL  3 mL Intravenous Q12H Patwardhan, Manish J, MD   3 mL at 06/05/22 0916   sodium chloride flush (NS) 0.9 %  injection 3 mL  3 mL Intravenous PRN Patwardhan, Manish J, MD        Medications Prior to Admission  Medication Sig Dispense Refill Last Dose   acetaminophen (TYLENOL) 500 MG tablet Take 2 tablets (1,000 mg total) by mouth every 8 (eight) hours. 30 tablet 0 Past Week   ELIQUIS 5 MG TABS tablet TAKE 1 TABLET BY MOUTH TWICE A  DAY (Patient taking differently: Take 5 mg by mouth 2 (two) times daily.) 180 tablet 2 06/02/2022 at Night   hydrochlorothiazide (HYDRODIURIL) 25 MG tablet Take 25 mg by mouth daily.   Past Week   lisinopril (ZESTRIL) 20 MG tablet TAKE 1 TABLET BY MOUTH EVERY DAY (Patient taking differently: Take 20 mg by mouth daily.) 90 tablet 2 Past Week   Multiple Vitamins-Minerals (MULTIVITAMIN WITH MINERALS) tablet Take 1 tablet by mouth daily.   Past Week   Multiple Vitamins-Minerals (OCUVITE PRESERVISION PO) Take 1 tablet by mouth 2 (two) times daily.   Past Week   naproxen sodium (ALEVE) 220 MG tablet Take 220 mg by mouth daily as needed (pain).   Past Week   pravastatin (PRAVACHOL) 20 MG tablet Take 20 mg by mouth daily.   Past Week   vitamin C (ASCORBIC ACID) 500 MG tablet Take 500 mg by mouth daily after breakfast.   Past Week   apixaban (ELIQUIS) 5 MG TABS tablet Take 1 tablet (5 mg total) by mouth 2 (two) times daily. (Patient not taking: Reported on 06/04/2022) 60 tablet 6 Not Taking    Family History  Problem Relation Age of Onset   Heart disease Mother    Breast cancer Niece        twice     Review of Systems:      Cardiac Review of Systems: Y or  [  N  ]= no   Resting SOB [ Y  ] Exertional SOB  [ Y ]     Pedal Edema [ N  ]    Palpitations [ Y ] Syncope  Aqua.Slicker  ]     General Review of Systems: [Y] = yes [ N ]=no Constitional: fatigue [ Y ]; nausea [ N ]; night sweats [ N ]; fever [  ]; or chills [ N ]                                                                Eye :  Amaurosis fugax[  N]; Resp: cough [ Y ];  wheezing[ N ];  hemoptysis[N  ];  GI:  vomiting[ N ];   melena[  N];  hematochezia Aqua.Slicker  ];  GU: hematuria[ N ];                 Heme/Lymph:   anemia[Y  ];  Neuro: TIA[ N ];   stroke[ N ];  seizures[ N ];   paresthesias[  ];  difficulty walking[  ];  Psych:depression[ Y ];   Endocrine: diabetes[N  ];    Physical Exam: BP 95/61   Pulse 97   Temp 98.3 F (36.8 C) (Oral)   Resp (!) 22   Ht 5' 4"$  (1.626 m)   Wt 75.5 kg   SpO2 95%   BMI 28.57 kg/m    General appearance: alert, cooperative, and no distress Head: Normocephalic, without obvious abnormality, atraumatic Neck: supple, symmetrical, trachea midline and JVD, no carotid bruit Resp: Clear to auscultation bilaterally Cardio: IRRR IRRR GI: soft, non-tender; bowel sounds normal; no masses,  no organomegaly Extremities: No LE edema Neurologic: Grossly normal Wound: Dressing is clean and dry  Diagnostic Studies & Laboratory data:     Recent Radiology Findings:   MR CARDIAC  MORPHOLOGY W WO CONTRAST  Result Date: 06/04/2022 CLINICAL DATA:  76F with PAF presents with large pericardial effusion s/p pericardiocentesis EXAM: CARDIAC MRI TECHNIQUE: The patient was scanned on a 1.5 Tesla Siemens magnet. A dedicated cardiac coil was used. Functional imaging was done using Fiesta sequences. 2,3, and 4 chamber views were done to assess for RWMA's. Modified Simpson's rule using a short axis stack was used to calculate an ejection fraction on a dedicated work Conservation officer, nature. The patient received 8.5 cc of Gadavist. After 10 minutes inversion recovery sequences were used to assess for infiltration and scar tissue. Phase contrast velocity mapping was performed above the aortic and pulmonic valves CONTRAST:  8.5 cc  of Gadavist FINDINGS: Left ventricle: -Normal wall thickness -Normal size -Low normal systolic function -Elevated ECV (34%) -No LGE LV EF:  50% (Normal 52-79%) Absolute volumes: LV EDV: 117m (Normal 78-167 mL) LV ESV: 595m(Normal 21-64 mL) LV SV: 5368mNormal 52-114 mL) CO:  4.8L/min (Normal 2.7-6.3 L/min) Indexed volumes: LV EDV: 30m29m-m (Normal 50-96 mL/sq-m) LV ESV: 30mL48mm (Normal 10-40 mL/sq-m) LV SV: 30mL/64m (Normal 33-64 mL/sq-m) CI: 2.7L/min/sq-m (Normal 1.9-3.9 L/min/sq-m) Right ventricle: Normal size with mild systolic dysfunction RV EF: 46% (Normal 52-80%) Absolute volumes: RV EDV: 110mL (43mal 79-175 mL) RV ESV: 59mL (N66ml 13-75 mL) RV SV: 51mL (No74m 56-110 mL) CO: 4.6L/min (Normal 2.7-6 L/min) Indexed volumes: RV EDV: 62mL/sq-m55mrmal 51-97 mL/sq-m) RV ESV: 33mL/sq-m 52mmal 9-42 mL/sq-m) RV SV: 29mL/sq-m (109mal 35-61 mL/sq-m) CI: 2.6L/min/sq-m (Normal 1.8-3.8 L/min/sq-m) Left atrium: Moderate enlargement Right atrium: Mild enlargement Mitral valve: Mild regurgitation Aortic valve: No regurgitation Tricuspid valve: Mild regurgitation Pulmonic valve: No regurgitation Aorta: Normal proximal ascending aorta Pericardium: Large complex pericardial effusion localized around LV lateral wall, measures up to 24mm. Perica51ml LGE and hyperenhancement on T2 STIR imaging consistent with pericardial inflammation Extracardiac structures: Bilateral pleural effusions IMPRESSION: 1. Large complex pericardial effusion localized around LV lateral wall, measures up to 24mm. There i62mricardial LGE and hyperenhancement on T2 STIR imaging consistent with pericardial inflammation 2. Normal LV size, normal wall thickness, and low normal systolic function (EF 50%). No LGE [id number] myocardial scar 3.  Normal RV size with mild systolic dysfunction (EF 46%) [id number] Signed   By: Christopher  SOswaldo Milian/19/2024 23:47   MR CARDIAC VELOCITY FLOW MAP  Result Date: 06/04/2022 CLINICAL DATA:  76F with PAF presents with large pericardial effusion s/p pericardiocentesis EXAM: CARDIAC MRI TECHNIQUE: The patient was scanned on a 1.5 Tesla Siemens magnet. A dedicated cardiac coil was used. Functional imaging was done using Fiesta sequences. 2,3, and 4 chamber views were  done to assess for RWMA's. Modified Simpson's rule using a short axis stack was used to calculate an ejection fraction on a dedicated work station using Conservation officer, naturereceived 8.5 cc of Gadavist. After 10 minutes inversion recovery sequences were used to assess for infiltration and scar tissue. Phase contrast velocity mapping was performed above the aortic and pulmonic valves CONTRAST:  8.5 cc  of Gadavist FINDINGS: Left ventricle: -Normal wall thickness -Normal size -Low normal systolic function -Elevated ECV (34%) -No LGE LV EF:  50% (Normal 52-79%) Absolute volumes: LV EDV: 106mL (Normal 753m7 mL) LV ESV: 53mL (Normal 2166mmL) LV SV: 53mL (Normal 52-30mmL) CO: 4.8L/min (Normal 2.7-6.3 L/min) Indexed volumes: LV EDV: 30mL/sq-m (Normal51m96 mL/sq-m) LV ESV: 30mL/sq-m (Normal 59m0 mL/sq-m) LV SV: 30mL/sq-m (Normal 375m mL/sq-m) CI: 2.7L/min/sq-m (Normal 1.9-3.9 L/min/sq-m) Right ventricle: Normal size with mild  systolic dysfunction RV EF: 46% (Normal 52-80%) Absolute volumes: RV EDV: 137m (Normal 79-175 mL) RV ESV: 580m(Normal 13-75 mL) RV SV: 515mNormal 56-110 mL) CO: 4.6L/min (Normal 2.7-6 L/min) Indexed volumes: RV EDV: 58m71m-m (Normal 51-97 mL/sq-m) RV ESV: 33mL84mm (Normal 9-42 mL/sq-m) RV SV: 29mL/30m (Normal 35-61 mL/sq-m) CI: 2.6L/min/sq-m (Normal 1.8-3.8 L/min/sq-m) Left atrium: Moderate enlargement Right atrium: Mild enlargement Mitral valve: Mild regurgitation Aortic valve: No regurgitation Tricuspid valve: Mild regurgitation Pulmonic valve: No regurgitation Aorta: Normal proximal ascending aorta Pericardium: Large complex pericardial effusion localized around LV lateral wall, measures up to 24mm. 74mcardial LGE and hyperenhancement on T2 STIR imaging consistent with pericardial inflammation Extracardiac structures: Bilateral pleural effusions IMPRESSION: 1. Large complex pericardial effusion localized around LV lateral wall, measures up to 24mm. T59m is pericardial LGE and  hyperenhancement on T2 STIR imaging consistent with pericardial inflammation 2. Normal LV size, normal wall thickness, and low normal systolic function (EF 50%). [id number] to suggest myocardial scar 3.  Normal RV size with mild systolic dysfunction (EF 46%) [id number] Signed   By: ChristopOswaldo MilianOn: 06/04/2022 23:47   MR CARDIAC VELOCITY FLOW MAP  Result Date: 06/04/2022 CLINICAL DATA:  31F with PAF presents with large pericardial effusion s/p pericardiocentesis EXAM: CARDIAC MRI TECHNIQUE: The patient was scanned on a 1.5 Tesla Siemens magnet. A dedicated cardiac coil was used. Functional imaging was done using Fiesta sequences. 2,3, and 4 chamber views were done to assess for RWMA's. Modified Simpson's rule using a short axis stack was used to calculate an ejection fraction on a dedicated work station Conservation officer, naturetient received 8.5 cc of Gadavist. After 10 minutes inversion recovery sequences were used to assess for infiltration and scar tissue. Phase contrast velocity mapping was performed above the aortic and pulmonic valves CONTRAST:  8.5 cc  of Gadavist FINDINGS: Left ventricle: -Normal wall thickness -Normal size -Low normal systolic function -Elevated ECV (34%) -No LGE LV EF:  50% (Normal 52-79%) Absolute volumes: LV EDV: 106mL (No46m 78-167 mL) LV ESV: 53mL (Nor69m21-64 mL) LV SV: 53mL (Norm18m2-114 mL) CO: 4.8L/min (Normal 2.7-6.3 L/min) Indexed volumes: LV EDV: 60mL/sq-m (54mal 50-96 mL/sq-m) LV ESV: 30mL/sq-m (N73ml 10-40 mL/sq-m) LV SV: 30mL/sq-m (No94m 33-64 mL/sq-m) CI: 2.7L/min/sq-m (Normal 1.9-3.9 L/min/sq-m) Right ventricle: Normal size with mild systolic dysfunction RV EF: 46% (Normal 52-80%) Absolute volumes: RV EDV: 110mL (Normal 741m5 mL) RV ESV: 59mL (Normal 1349mmL) RV SV: 51mL (Normal 56-75mmL) CO: 4.6L/min (Normal 2.7-6 L/min) Indexed volumes: RV EDV: 58mL/sq-m (Normal28m97 mL/sq-m) RV ESV: 33mL/sq-m (Normal 86m mL/sq-m) RV SV: 29mL/sq-m  (Normal 1m1 mL/sq-m) CI: 2.6L/min/sq-m (Normal 1.8-3.8 L/min/sq-m) Left atrium: Moderate enlargement Right atrium: Mild enlargement Mitral valve: Mild regurgitation Aortic valve: No regurgitation Tricuspid valve: Mild regurgitation Pulmonic valve: No regurgitation Aorta: Normal proximal ascending aorta Pericardium: Large complex pericardial effusion localized around LV lateral wall, measures up to 24mm. Pericardial LG73md hyperenhancement on T2 STIR imaging consistent with pericardial inflammation Extracardiac structures: Bilateral pleural effusions IMPRESSION: 1. Large complex pericardial effusion localized around LV lateral wall, measures up to 24mm. There is perica57ml LGE and hyperenhancement on T2 STIR imaging consistent with pericardial inflammation 2. Normal LV size, normal wall thickness, and low normal systolic function (EF 50%). No LGE to [id number] scar 3.  Normal RV size with mild systolic dysfunction (EF 46%) Electronically [id number]   By: Christopher  Schumann Oswaldo Milian 23:47   ECHOCARDIOGRAM LIMITED  Result Date: 06/04/2022  ECHOCARDIOGRAM LIMITED REPORT   Patient Name:   NELIA HOSP Date of Exam: 06/04/2022 Medical Rec #:  [id number]           Taylor:       64.0 in Accession #:    [id number]          Weight:       154.0 lb Date of Birth:  09-Sep-1934           BSA:          1.751 m Patient Age:    9 years            BP:           113/65 mmHg Patient Gender: F                   HR:           86 bpm. Exam Location:  Inpatient Procedure: 2D Echo, Cardiac Doppler and Color Doppler                              MODIFIED REPORT: This report was modified by Vernell Leep MD on 06/04/2022 due to Error.  Indications:     Pericardial effusion I31.3  History:         Patient has prior history of Echocardiogram examinations, most                  recent 06/03/2022. Signs/Symptoms:Murmur; Risk                  Factors:Hypertension. Pericardial effusion. Post                   pericardiocentesis.  Sonographer:     Philipp Deputy RDCS Referring Phys:  [id number] [facility] J PATWARDHAN Diagnosing Phys: Vernell Leep MD  Sonographer Comments: Global longitudinal strain was attempted. IMPRESSIONS  1. Left ventricular ejection fraction, by estimation, is 55 to 60%. The left ventricle has normal function. There is mild left ventricular hypertrophy. Left ventricular diastolic parameters are indeterminate. There is the interventricular septum is flattened in systole, consistent with right ventricular pressure overload.  2. The right ventricular size is mildly enlarged. There is mildly elevated pulmonary artery systolic pressure.  3. Right atrial size was moderately dilated.  4. A small pericardial effusion is present. The pericardial effusion is posterior to the left ventricle.  5. Mild to moderate mitral valve regurgitation.  6. Tricuspid valve regurgitation is moderate.  7. The aortic valve is tricuspid. There is mild calcification of the aortic valve.  8. The inferior vena cava is dilated in size with >50% respiratory variability, suggesting right atrial pressure of 8 mmHg. Comparison(s): Compared to previous complete study in 2020, right sided dilatation, PH are new findings. Compared to studies on 06/03/2022, there is resolution of the large pericardial efufsion after pericardiocentesis. FINDINGS  Left Ventricle: Left ventricular ejection fraction, by estimation, is 55 to 60%. The left ventricle has normal function. The left ventricular internal cavity size was normal in size. There is mild left ventricular hypertrophy. The interventricular septum is flattened in systole, consistent with right ventricular pressure overload. Left ventricular diastolic parameters are indeterminate. Right Ventricle: The right ventricular size is mildly enlarged. There is mildly elevated pulmonary artery systolic pressure. The tricuspid regurgitant velocity is 2.94 m/s, and with an assumed right atrial pressure  of 8 mmHg, the estimated right ventricular systolic pressure is [id number] mmHg. Right Atrium: Right  atrial size was moderately dilated. Pericardium: A small pericardial effusion is present. The pericardial effusion is posterior to the left ventricle. Mitral Valve: Mild to moderate mitral valve regurgitation. Tricuspid Valve: The tricuspid valve is normal in structure. Tricuspid valve regurgitation is moderate . No evidence of tricuspid stenosis. Aortic Valve: The aortic valve is tricuspid. There is mild calcification of the aortic valve. Venous: The inferior vena cava is dilated in size with greater than 50% respiratory variability, suggesting right atrial pressure of 8 mmHg. LEFT VENTRICLE PLAX 2D LVIDd:         4.00 cm   Diastology LVIDs:         2.50 cm   LV e' medial:    7.83 cm/s LV PW:         1.20 cm   LV E/e' medial:  12.0 LV IVS:        1.20 cm   LV e' lateral:   9.79 cm/s LVOT diam:     1.90 cm   LV E/e' lateral: 9.6 LV SV:         31 LV SV Index:   18 LVOT Area:     2.84 cm  RIGHT VENTRICLE             IVC RV Basal diam:  2.70 cm     IVC diam: 2.30 cm RV S prime:     10.10 cm/s TAPSE (M-mode): 1.1 cm LEFT ATRIUM           Index        RIGHT ATRIUM           Index LA diam:      5.00 cm 2.86 cm/m   RA Area:     17.30 cm LA Vol (A4C): 45.3 ml 25.88 ml/m  RA Volume:   41.50 ml  23.70 ml/m  AORTIC VALVE LVOT Vmax:   59.10 cm/s LVOT Vmean:  43.500 cm/s LVOT VTI:    0.109 m  AORTA Ao Root diam: 3.00 cm Ao Asc diam:  3.50 cm Ao Desc diam: 1.70 cm MITRAL VALVE               TRICUSPID VALVE MV Area (PHT): 4.93 cm    TR Peak grad:   34.6 mmHg MV Decel Time: 154 msec    TR Vmax:        294.00 cm/s MR Peak grad: 66.9 mmHg MR Mean grad: 46.0 mmHg    SHUNTS MR Vmax:      409.00 cm/s  Systemic VTI:  0.11 m MR Vmean:     321.0 cm/s   Systemic Diam: 1.90 cm MV E velocity: 93.80 cm/s Vernell Leep MD Electronically signed by Vernell Leep MD Signature Date/Time: 06/04/2022/12:50:29 PM    Final (Updated)     ECHOCARDIOGRAM LIMITED  Result Date: 06/04/2022    ECHOCARDIOGRAM LIMITED REPORT   Patient Name:   Sheena Taylor Date of Exam: 06/03/2022 Medical Rec #:  [id number]           Taylor:       64.0 in Accession #:    [id number]          Weight:       154.0 lb Date of Birth:  [birth year]           BSA:          1.751 m Patient Age:    62 years            BP:  99/74 mmHg Patient Gender: F                   HR:           135 bpm. Exam Location:  Inpatient Procedure: Limited Echo                              MODIFIED REPORT: This report was modified by Vernell Leep MD on 06/04/2022 due to Error.  Indications:     I31.3 Pericardial effusion  History:         Patient has prior history of Echocardiogram examinations, most                  recent 06/03/2022. Arrythmias:Atrial Fibrillation; Risk                  Factors:Hypertension.  Sonographer:     Raquel Sarna Senior RDCS Referring Phys:  [id number] Reynold Bowen PATWARDHAN Diagnosing Phys: Vernell Leep MD  Sonographer Comments: Pericardiocentesis IMPRESSIONS  1. Multiple intraprocedural images during pericardiocentesis show initial     circumferential pericardial effusion, agitated saline injection into the     pericardial space through the needle, followed by near complete resolution     of pericardial effusion. Small amount of posterior pericardial effusion remains.  2. Left ventricular ejection fraction, by estimation, is 55 to 60%. The left ventricle has normal function. The left ventricle has no regional wall motion abnormalities. FINDINGS  Left Ventricle: Left ventricular ejection fraction, by estimation, is 55 to 60%. The left ventricle has normal function. The left ventricle has no regional wall motion abnormalities. Pericardium: Multiple intraprocedural images during pericardiocentesis show initial circumferential pericardial effusion, agitated saline injection into the pericardial space through the needle, followed by near complete resolution of  pericardial effusion. Small amount of posterior pericardial effusion remains.   LV Volumes (MOD) LV vol d, MOD A2C: 61.0 ml LV vol d, MOD A4C: 69.4 ml LV vol s, MOD A2C: 21.0 ml LV vol s, MOD A4C: 28.3 ml LV SV MOD A2C:     40.0 ml LV SV MOD A4C:     69.4 ml LV SV MOD BP:      41.6 ml AORTIC VALVE LVOT Vmax:   103.00 cm/s LVOT Vmean:  65.400 cm/s LVOT VTI:    0.171 m  SHUNTS Systemic VTI: 0.17 m Vernell Leep MD Electronically signed by Vernell Leep MD Signature Date/Time: 06/03/2022/1:52:20 PM    Final (Updated)    DG Chest Port 1 View  Result Date: 06/04/2022 CLINICAL DATA:  Shortness of breath EXAM: PORTABLE CHEST 1 VIEW COMPARISON:  Chest radiograph dated 06/03/2022 FINDINGS: Normal lung volumes. Patchy left retrocardiac opacities. No pleural effusion or pneumothorax. Similar enlarged cardiomediastinal silhouette. The visualized skeletal structures are unremarkable. IMPRESSION: Patchy left retrocardiac opacities, which may represent atelectasis or infection. Electronically Signed   By: Darrin Nipper M.D.   On: 06/04/2022 08:18   DG Chest Port 1 View  Result Date: 06/03/2022 CLINICAL DATA:  [id number] Pericardial effusion [id number] EXAM: PORTABLE CHEST - 1 VIEW COMPARISON:  06/03/2022 FINDINGS: Cardiac silhouette is prominent. There is pulmonary interstitial prominence with vascular congestion. No focal consolidation. No pneumothorax or pleural effusion identified. Aorta is calcified. IMPRESSION: Findings suggest CHF. Electronically Signed   By: Sammie Bench M.D.   On: 06/03/2022 13:08     I have independently reviewed the above radiologic studies and discussed with the patient   Recent Lab Findings: Lab  Results  Component Value Date   WBC 15.1 (H) 06/05/2022   HGB 10.6 (L) 06/05/2022   HCT 31.6 (L) 06/05/2022   PLT 426 (H) 06/05/2022   GLUCOSE 97 06/05/2022   ALT 21 06/03/2022   AST 30 06/03/2022   NA 132 (L) 06/05/2022   K 4.0 06/05/2022   CL 98 06/05/2022   CREATININE 0.96 06/05/2022    BUN 33 (H) 06/05/2022   CO2 22 06/05/2022   INR 0.96 01/10/2015   Assessment / Plan:   Pericardial effusion-s/p pericardiocentesis. Hemorrhagic fluid removed. ?etiology...possible inflammatory process (I.e. Viral syndrome, pericarditis). Recent echo done 02/19 showed small pericardial effusion, LVEF 55-60%, mild LVH, mild to moderate MR and moderated TR. Surgeon to determine if needs pericardial window. She is on Colchicine. Await cytology result 2. History of atrial fibrillation-on Cardizem drip. Apixaban has not been restarted yet.  I  spent 20 minutes counseling the patient face to face.   Lars Pinks PA-C 06/05/2022 11:36 AM    Agree with above Images reviewed and case discussed with patient and family.  Pericardial effusion is much more evident on MR compared to Echo.  Clinically stable, does complain of some shortness of breath.  Have recommended that she undergo thoracentesis given bilateral pleural effusions.   No plans for pericardial window at this point.  Repeat echo as outpatient.  Harrell Bary Leriche

## 2022-06-05 NOTE — Progress Notes (Signed)
Subjective:  Pleuritic pain with mild shortness of breath   Current Facility-Administered Medications:    0.9 %  sodium chloride infusion, 250 mL, Intravenous, Continuous, Sherwood Gambler, MD, Stopped at 06/03/22 0821   0.9 %  sodium chloride infusion, 250 mL, Intravenous, PRN, Henley Boettner J, MD   acetaminophen (TYLENOL) tablet 1,000 mg, 1,000 mg, Oral, Q8H, Darryl Blumenstein J, MD, 1,000 mg at 06/05/22 1253   acetaminophen (TYLENOL) tablet 650 mg, 650 mg, Oral, Q4H PRN, Rosalyn Archambault J, MD, 650 mg at 06/05/22 1943   Chlorhexidine Gluconate Cloth 2 % PADS 6 each, 6 each, Topical, Daily, Munirah Doerner J, MD, 6 each at 06/05/22 0916   colchicine tablet 0.6 mg, 0.6 mg, Oral, BID, Tamas Suen J, MD, 0.6 mg at 06/05/22 0915   diltiazem (CARDIZEM SR) 12 hr capsule 90 mg, 90 mg, Oral, Q12H, Meher Kucinski J, MD, 90 mg at 06/05/22 1854   diltiazem (CARDIZEM) 125 mg in dextrose 5% 125 mL (1 mg/mL) infusion, 5-15 mg/hr, Intravenous, Continuous, Dearia Wilmouth J, MD, Last Rate: 10 mL/hr at 06/05/22 2000, 10 mg/hr at 06/05/22 2000   ibuprofen (ADVIL) tablet 600 mg, 600 mg, Oral, Q8H PRN, Braelynn Lupton J, MD   melatonin tablet 5 mg, 5 mg, Oral, QHS PRN, Keywon Mestre J, MD, 5 mg at 06/04/22 2120   ondansetron (ZOFRAN) injection 4 mg, 4 mg, Intravenous, Q6H PRN, Haneen Bernales J, MD   Oral care mouth rinse, 15 mL, Mouth Rinse, PRN, Cavan Bearden J, MD   sodium chloride flush (NS) 0.9 % injection 3 mL, 3 mL, Intravenous, Q12H, Coalton Arch J, MD, 3 mL at 06/05/22 1938   sodium chloride flush (NS) 0.9 % injection 3 mL, 3 mL, Intravenous, PRN, Ab Leaming J, MD   Objective:  Vital Signs in the last 24 hours: Temp:  [98.1 F (36.7 C)-99.2 F (37.3 C)] 99 F (37.2 C) (02/20 2044) Pulse Rate:  [94-127] 122 (02/20 2044) Resp:  [20-35] 20 (02/20 2044) BP: (92-136)/(53-108) 117/67 (02/20 2044) SpO2:  [84 %-96 %] 93 % (02/20 2044) Weight:  [75.5  kg] 75.5 kg (02/20 0520)  Intake/Output from previous day: 02/19 0701 - 02/20 0700 In: 1184.3 [P.O.:720; I.V.:164.4; IV Piggyback:249.9] Out: 1225 [Urine:1225]  Physical Exam Vitals and nursing note reviewed.  Constitutional:      General: She is not in acute distress. Neck:     Vascular: No JVD.  Cardiovascular:     Rate and Rhythm: Tachycardia present. Rhythm irregular.     Heart sounds: Normal heart sounds. No murmur heard. Pulmonary:     Effort: Pulmonary effort is normal.     Breath sounds: Transmitted upper airway sounds present. Decreased breath sounds (at bases) present. No wheezing or rales.      Imaging/tests reviewed and independently interpreted:  Cardiac MRI 06/04/2022: 1. Large complex pericardial effusion localized around LV lateral wall, measures up to 15m. There is pericardial LGE and hyperenhancement on T2 STIR imaging consistent with pericardial inflammation   2. Normal LV size, normal wall thickness, and low normal systolic function (EF [id number]. No LGE to suggest myocardial scar   3.  Normal RV size with mild systolic dysfunction (EF [id number]    CXR 06/04/2022: COMPARISON:  Chest radiograph dated 06/03/2022   FINDINGS: Normal lung volumes. Patchy left retrocardiac opacities. No pleural effusion or pneumothorax. Similar enlarged cardiomediastinal silhouette. The visualized skeletal structures are unremarkable.   IMPRESSION: Patchy left retrocardiac opacities, which may represent atelectasis or infection.    Cardiac Studies:  Telemetry  06/04/2022: Afib, rate 90-110  EKG 06/03/2022: Atrial fibrillation w/RVR Low voltage, extremity and precordial leads Prolonged QT interval  Echocardiogram 06/04/2022: EF 55-60%. Septal flattening due to RVPO Mild MR, mod TR, mild PH Small posterior pericardial effusion  Pericardiocentesis 2/18: Ultrasound guided pericardiocentesis through apical access. 600 cc of hemorrhagic fluid drained.  Assessment &  Recommendations:  88 y.o. Caucasian female with hypertension, arthritis, paroxysmal atrial fibrillation, large pericardial effusion   Pericardial effusion: Cardiac tamponade on 2/18, underwent pericardiocentesis w/600 cc hemorrhagic pericardial fluid drained. No infectious findings. Cytology pending. Residual large loculation of complex pericardial fluid, mostly lateral and posterior (MRI 06/04/2022). This is not drainable by repeat pericardiocentesis. I will get CT surgery opinion if she would benefit from window.  Continue to hold Eliquis for now. Will consider resuming next week.  Atelectasis: No s/s of pneumonia. Use incentive spirometry.  Afib: Continue diltiazem drip. Will add PO later today.   Nigel Mormon, MD Pager: [phone] Office: [phone]

## 2022-06-06 ENCOUNTER — Inpatient Hospital Stay (HOSPITAL_COMMUNITY): Payer: Medicare Other

## 2022-06-06 LAB — CBC
HCT: 31 % — ABNORMAL LOW (ref 36.0–46.0)
Hemoglobin: 10.4 g/dL — ABNORMAL LOW (ref 12.0–15.0)
MCH: 30.7 pg (ref 26.0–34.0)
MCHC: 33.5 g/dL (ref 30.0–36.0)
MCV: 91.4 fL (ref 80.0–100.0)
Platelets: 452 10*3/uL — ABNORMAL HIGH (ref 150–400)
RBC: 3.39 MIL/uL — ABNORMAL LOW (ref 3.87–5.11)
RDW: 13.4 % (ref 11.5–15.5)
WBC: 17.4 10*3/uL — ABNORMAL HIGH (ref 4.0–10.5)
nRBC: 0 % (ref 0.0–0.2)

## 2022-06-06 LAB — BODY FLUID CULTURE W GRAM STAIN: Culture: NO GROWTH

## 2022-06-06 LAB — BASIC METABOLIC PANEL
Anion gap: 9 (ref 5–15)
BUN: 22 mg/dL (ref 8–23)
CO2: 22 mmol/L (ref 22–32)
Calcium: 8.5 mg/dL — ABNORMAL LOW (ref 8.9–10.3)
Chloride: 99 mmol/L (ref 98–111)
Creatinine, Ser: 0.73 mg/dL (ref 0.44–1.00)
GFR, Estimated: >60 mL/min (ref >60)
Glucose, Bld: 112 mg/dL — ABNORMAL HIGH (ref 70–99)
Potassium: 4 mmol/L (ref 3.5–5.1)
Sodium: 130 mmol/L — ABNORMAL LOW (ref 135–145)

## 2022-06-06 MED ORDER — FUROSEMIDE 10 MG/ML IJ SOLN
40.0000 mg | Freq: Every day | INTRAMUSCULAR | Status: DC
  Administered 2022-06-06 – 2022-06-10 (×5): 40 mg via INTRAVENOUS
  Filled 2022-06-06 (×5): qty 4

## 2022-06-06 MED ORDER — AMIODARONE HCL IN DEXTROSE 360-4.14 MG/200ML-% IV SOLN
30.0000 mg/h | INTRAVENOUS | Status: AC
  Administered 2022-06-06 – 2022-06-09 (×6): 30 mg/h via INTRAVENOUS
  Filled 2022-06-06 (×7): qty 200

## 2022-06-06 MED ORDER — AMIODARONE LOAD VIA INFUSION
150.0000 mg | Freq: Once | INTRAVENOUS | Status: AC
  Administered 2022-06-06: 150 mg via INTRAVENOUS
  Filled 2022-06-06: qty 83.34

## 2022-06-06 MED ORDER — GUAIFENESIN 200 MG PO TABS
200.0000 mg | ORAL_TABLET | Freq: Four times a day (QID) | ORAL | Status: DC | PRN
  Administered 2022-06-06 – 2022-06-11 (×10): 200 mg via ORAL
  Filled 2022-06-06 (×12): qty 1

## 2022-06-06 MED ORDER — AMIODARONE HCL IN DEXTROSE 360-4.14 MG/200ML-% IV SOLN
60.0000 mg/h | INTRAVENOUS | Status: AC
  Administered 2022-06-06 (×2): 60 mg/h via INTRAVENOUS
  Filled 2022-06-06 (×2): qty 200

## 2022-06-06 NOTE — Progress Notes (Signed)
MEWS Progress Note  Patient Details Name: VOLANDA HALLAK MRN: [id number] DOB: [birth year] Today's Date: 06/06/2022   MEWS Flowsheet Documentation:  Assess: MEWS Score Temp: 98.4 F (36.9 C) BP: 113/73 MAP (mmHg): 85 Pulse Rate: (!) 124 ECG Heart Rate: (!) 123 Resp: (!) 24 Level of Consciousness: Alert SpO2: 95 % O2 Device: Room Air Patient Activity (if Appropriate): In bed O2 Flow Rate (L/min): 2 L/min Assess: MEWS Score MEWS Temp: 0 MEWS Systolic: 0 MEWS Pulse: 2 MEWS RR: 1 MEWS LOC: 0 MEWS Score: 3 MEWS Score Color: Yellow Assess: SIRS CRITERIA SIRS Temperature : 0 SIRS Respirations : 1 SIRS Pulse: 1 SIRS WBC: 1 SIRS Score Sum : 3 SIRS Temperature : 0 SIRS Pulse: 1 SIRS Respirations : 1 SIRS WBC: 1 SIRS Score Sum : 3 Assess: if the MEWS score is Yellow or Red Were vital signs taken at a resting state?: Yes Focused Assessment: No change from prior assessment Does the patient meet 2 or more of the SIRS criteria?: No MEWS guidelines implemented : Yes, yellow Treat MEWS Interventions: Considered administering scheduled or prn medications/treatments as ordered Take Vital Signs Increase Vital Sign Frequency : Yellow: Q2hr x1, continue Q4hrs until patient remains green for 12hrs Escalate MEWS: Escalate: Yellow: Discuss with charge nurse and consider notifying provider and/or RRT Notify: Charge Nurse/RN Name of Charge Nurse/RN Notified: Ivin Booty, RN Provider Notification Provider Name/Title: Dr. Virgina Jock Date Provider Notified: 06/05/22 Time Provider Notified: [id number] Method of Notification:  (secure chat) Notification Reason: Other (Comment) (still on diltiazem drip at 67m. she had 973moral at 1854. hr is 100s at rest. 120s with any activity in bed. per report dilt drip stops at 2300) Provider response: Other (Comment) (per MD continue diltiazem drip overnight) Date of Provider Response: 06/05/22 Time of Provider Response: [id number]      SaFrederico Hamman/21/2024, 5:37 AM

## 2022-06-06 NOTE — Care Management Important Message (Signed)
Important Message  Patient Details  Name: KAYDENSE SCIALDONE MRN: [id number] Date of Birth: 1934/10/23   Medicare Important Message Given:  Yes     Shelda Altes 06/06/2022, 10:27 AM

## 2022-06-06 NOTE — Progress Notes (Signed)
Subjective:  Pleuritic pain with mild shortness of breath   Current Facility-Administered Medications:    0.9 %  sodium chloride infusion, 250 mL, Intravenous, Continuous, Sherwood Gambler, MD, Stopped at 06/03/22 0821   0.9 %  sodium chloride infusion, 250 mL, Intravenous, PRN, Devonte Migues J, MD   acetaminophen (TYLENOL) tablet 1,000 mg, 1,000 mg, Oral, Q8H, Rithik Odea J, MD, 1,000 mg at 06/06/22 [id number]   acetaminophen (TYLENOL) tablet 650 mg, 650 mg, Oral, Q4H PRN, Arayna Illescas J, MD, 650 mg at 06/05/22 1943   [COMPLETED] amiodarone (NEXTERONE) 1.8 mg/mL load via infusion 150 mg, 150 mg, Intravenous, Once, 150 mg at 06/06/22 0942 **FOLLOWED BY** amiodarone (NEXTERONE PREMIX) 360-4.14 MG/200ML-% (1.8 mg/mL) IV infusion, 60 mg/hr, Intravenous, Continuous, Last Rate: 33.3 mL/hr at 06/06/22 0950, 60 mg/hr at 06/06/22 0950 **FOLLOWED BY** amiodarone (NEXTERONE PREMIX) 360-4.14 MG/200ML-% (1.8 mg/mL) IV infusion, 30 mg/hr, Intravenous, Continuous, Lailyn Appelbaum J, MD   Chlorhexidine Gluconate Cloth 2 % PADS 6 each, 6 each, Topical, Daily, Martise Waddell J, MD, 6 each at 06/05/22 0916   colchicine tablet 0.6 mg, 0.6 mg, Oral, BID, Addalyne Vandehei J, MD, 0.6 mg at 06/06/22 0950   diltiazem (CARDIZEM SR) 12 hr capsule 90 mg, 90 mg, Oral, Q12H, Kenadee Gates J, MD, 90 mg at 06/06/22 0950   furosemide (LASIX) injection 40 mg, 40 mg, Intravenous, Daily, Jearlene Bridwell J, MD, 40 mg at 06/06/22 0940   ibuprofen (ADVIL) tablet 600 mg, 600 mg, Oral, Q8H PRN, Coady Train J, MD   melatonin tablet 5 mg, 5 mg, Oral, QHS PRN, Kiefer Opheim J, MD, 5 mg at 06/05/22 2309   ondansetron (ZOFRAN) injection 4 mg, 4 mg, Intravenous, Q6H PRN, Helmer Dull J, MD   Oral care mouth rinse, 15 mL, Mouth Rinse, PRN, Aolanis Crispen J, MD   sodium chloride flush (NS) 0.9 % injection 3 mL, 3 mL, Intravenous, Q12H, Shamanda Len J, MD, 3 mL at 06/06/22 0952   sodium  chloride flush (NS) 0.9 % injection 3 mL, 3 mL, Intravenous, PRN, Demesha Boorman J, MD, 3 mL at 06/05/22 2309   Objective:  Vital Signs in the last 24 hours: Temp:  [98.2 F (36.8 C)-99.2 F (37.3 C)] 98.2 F (36.8 C) (02/21 1101) Pulse Rate:  [94-129] 104 (02/21 1101) Resp:  [20-33] 20 (02/21 1101) BP: (104-138)/(56-108) 138/73 (02/21 1101) SpO2:  [92 %-98 %] 98 % (02/21 1101)  Intake/Output from previous day: 02/20 0701 - 02/21 0700 In: 529.7 [P.O.:240; I.V.:289.7] Out: 690 [Urine:690]  Physical Exam Vitals and nursing note reviewed.  Constitutional:      General: She is not in acute distress. Neck:     Vascular: No JVD.  Cardiovascular:     Rate and Rhythm: Tachycardia present. Rhythm irregular.     Heart sounds: Normal heart sounds. No murmur heard. Pulmonary:     Effort: Pulmonary effort is normal.     Breath sounds: Transmitted upper airway sounds present. Decreased breath sounds (at bases) present. No wheezing or rales.      Imaging/tests reviewed and independently interpreted:  Cardiac MRI 06/04/2022: 1. Large complex pericardial effusion localized around LV lateral wall, measures up to 29m. There is pericardial LGE and hyperenhancement on T2 STIR imaging consistent with pericardial inflammation   2. Normal LV size, normal wall thickness, and low normal systolic function (EF 5A999333. No LGE to suggest myocardial scar   3.  Normal RV size with mild systolic dysfunction (EF [id number]    CXR 06/04/2022: COMPARISON:  Chest  radiograph dated 06/03/2022   FINDINGS: Normal lung volumes. Patchy left retrocardiac opacities. No pleural effusion or pneumothorax. Similar enlarged cardiomediastinal silhouette. The visualized skeletal structures are unremarkable.   IMPRESSION: Patchy left retrocardiac opacities, which may represent atelectasis or infection.    Cardiac Studies:  Telemetry 06/04/2022: Afib, rate 90-110  EKG 06/03/2022: Atrial fibrillation  w/RVR Low voltage, extremity and precordial leads Prolonged QT interval  Echocardiogram 06/04/2022: EF 55-60%. Septal flattening due to RVPO Mild MR, mod TR, mild PH Small posterior pericardial effusion  Pericardiocentesis 2/18: Ultrasound guided pericardiocentesis through apical access. 600 cc of hemorrhagic fluid drained.  Assessment & Recommendations:  88 y.o. Caucasian female with hypertension, arthritis, paroxysmal atrial fibrillation, large pericardial effusion   Pericardial effusion: Cardiac tamponade on 2/18, underwent pericardiocentesis w/600 cc hemorrhagic pericardial fluid drained. No infectious findings. Cytology negative for malignancy Residual large loculation of complex pericardial fluid, mostly lateral and posterior (MRI 06/04/2022). This is not drainable by repeat pericardiocentesis. Discussed with Dr. Kipp Brood. We decided to pursue conservative approach for now in absence of tamponade.  She is not thrilled about either pericardial window or thoracentesis. Continue incentive spirometry. IV lasix 40 mg daily for now. Continue to hold Eliquis for now given bloody effusion.  Underlying inflammation evident on MRI. Started n ibuprofen 600 mg tid for now. Continue colchicine 0.6 mg bid.  Afib: Keep PO diltiazem 90 mg bid. Changed IV diltiazem to IV amiodarone.  Social work consult for discharge planning.    Nigel Mormon, MD Pager: [phone] Office: [phone]

## 2022-06-06 NOTE — TOC Progression Note (Signed)
Transition of Care [facility]) - Progression Note    Patient Details  Name: Sheena Taylor MRN: [id number] Date of Birth: 1934-11-21  Transition of Care [facility]) CM/SW [place], [place] Phone Number: 06/06/2022, 3:11 PM  Clinical Narrative:     Patient is from [place]   Expected Discharge Plan: Assisted Living Barriers to Discharge: Continued Medical Work up  Expected Discharge Plan and Services                                               Social Determinants of Health (SDOH) Interventions SDOH Screenings   Food Insecurity: No Food Insecurity (06/03/2022)  Housing: Low Risk  (06/03/2022)  Transportation Needs: No Transportation Needs (06/03/2022)  Utilities: Not At Risk (06/03/2022)  Tobacco Use: High Risk (06/04/2022)    Readmission Risk Interventions     No data to display

## 2022-06-07 ENCOUNTER — Inpatient Hospital Stay (HOSPITAL_COMMUNITY): Payer: Medicare Other

## 2022-06-07 HISTORY — PX: IR THORACENTESIS ASP PLEURAL SPACE W/IMG GUIDE: IMG5380

## 2022-06-07 LAB — GRAM STAIN

## 2022-06-07 LAB — PROTEIN, PLEURAL OR PERITONEAL FLUID: Total protein, fluid: <3 g/dL

## 2022-06-07 LAB — ALBUMIN, PLEURAL OR PERITONEAL FLUID: Albumin, Fluid: <1.5 g/dL

## 2022-06-07 LAB — CBC
HCT: 30.2 % — ABNORMAL LOW (ref 36.0–46.0)
Hemoglobin: 10.4 g/dL — ABNORMAL LOW (ref 12.0–15.0)
MCH: 31.1 pg (ref 26.0–34.0)
MCHC: 34.4 g/dL (ref 30.0–36.0)
MCV: 90.4 fL (ref 80.0–100.0)
Platelets: 439 10*3/uL — ABNORMAL HIGH (ref 150–400)
RBC: 3.34 MIL/uL — ABNORMAL LOW (ref 3.87–5.11)
RDW: 13.2 % (ref 11.5–15.5)
WBC: 18.6 10*3/uL — ABNORMAL HIGH (ref 4.0–10.5)
nRBC: 0 % (ref 0.0–0.2)

## 2022-06-07 LAB — BASIC METABOLIC PANEL
Anion gap: 11 (ref 5–15)
BUN: 20 mg/dL (ref 8–23)
CO2: 23 mmol/L (ref 22–32)
Calcium: 8.4 mg/dL — ABNORMAL LOW (ref 8.9–10.3)
Chloride: 97 mmol/L — ABNORMAL LOW (ref 98–111)
Creatinine, Ser: 0.8 mg/dL (ref 0.44–1.00)
GFR, Estimated: >60 mL/min (ref >60)
Glucose, Bld: 113 mg/dL — ABNORMAL HIGH (ref 70–99)
Potassium: 3.3 mmol/L — ABNORMAL LOW (ref 3.5–5.1)
Sodium: 131 mmol/L — ABNORMAL LOW (ref 135–145)

## 2022-06-07 LAB — GLUCOSE, PLEURAL OR PERITONEAL FLUID: Glucose, Fluid: 114 mg/dL

## 2022-06-07 LAB — BRAIN NATRIURETIC PEPTIDE: B Natriuretic Peptide: 302 pg/mL — ABNORMAL HIGH (ref 0.0–100.0)

## 2022-06-07 MED ORDER — OXYCODONE HCL 5 MG PO TABS
2.5000 mg | ORAL_TABLET | Freq: Once | ORAL | Status: AC | PRN
  Administered 2022-06-09: 2.5 mg via ORAL
  Filled 2022-06-07: qty 1

## 2022-06-07 MED ORDER — METOPROLOL TARTRATE 25 MG PO TABS
25.0000 mg | ORAL_TABLET | Freq: Two times a day (BID) | ORAL | Status: DC
  Administered 2022-06-07 – 2022-06-09 (×5): 25 mg via ORAL
  Filled 2022-06-07 (×5): qty 1

## 2022-06-07 MED ORDER — LIDOCAINE HCL 1 % IJ SOLN
INTRAMUSCULAR | Status: AC
  Filled 2022-06-07: qty 20

## 2022-06-07 MED ORDER — POTASSIUM CHLORIDE CRYS ER 20 MEQ PO TBCR
40.0000 meq | EXTENDED_RELEASE_TABLET | ORAL | Status: AC
  Administered 2022-06-07 (×2): 40 meq via ORAL
  Filled 2022-06-07 (×2): qty 2

## 2022-06-07 MED ORDER — DILTIAZEM HCL ER 60 MG PO CP12
120.0000 mg | ORAL_CAPSULE | Freq: Two times a day (BID) | ORAL | Status: DC
  Administered 2022-06-07 – 2022-06-12 (×11): 120 mg via ORAL
  Filled 2022-06-07 (×12): qty 2

## 2022-06-07 NOTE — Evaluation (Signed)
Physical Therapy Evaluation Patient Details Name: Sheena Taylor MRN: [id number] DOB: [birth year] Today's Date: 06/07/2022  History of Present Illness  88 yo female admitted 2/18 from [place] with CP, SOB, AFib with RVR and pericardial effusion s/p pericardiocentesis same date. PMHx; Afib, HTN, bil TKA  Clinical Impression  Pt pleasant and eager to get OOB but limited by fatigue, weakness and SOB with HR 125-145 during session. Pt with decreased strength, balance and function who will benefit from acute therapy to maximize mobility, safety and independence to decrease burden of care.  SpO2 96% on RA       Recommendations for follow up therapy are one component of a multi-disciplinary discharge planning process, led by the attending physician.  Recommendations may be updated based on patient status, additional functional criteria and insurance authorization.  Follow Up Recommendations Skilled nursing-short term rehab (<3 hours/day) Can patient physically be transported by private vehicle: No    Assistance Recommended at Discharge Frequent or constant Supervision/Assistance  Patient can return home with the following  A lot of help with walking and/or transfers;A little help with bathing/dressing/bathroom;Assistance with cooking/housework;Direct supervision/assist for medications management;Assist for transportation    Equipment Recommendations Rolling walker (2 wheels);BSC/3in1  Recommendations for Other Services       Functional Status Assessment Patient has had a recent decline in their functional status and/or demonstrates limited ability to make significant improvements in function in a reasonable and predictable amount of time     Precautions / Restrictions Precautions Precautions: Fall;Other (comment) Precaution Comments: watch HR      Mobility  Bed Mobility Overal bed mobility: Needs Assistance Bed Mobility: Rolling, Supine to Sit Rolling: Min assist   Supine  to sit: Min guard     General bed mobility comments: min assist to roll for bed pan removal and pericare. Min guard with rail and increased time to transition to sitting with HOB 20 degrees    Transfers Overall transfer level: Needs assistance   Transfers: Sit to/from Stand, Bed to chair/wheelchair/BSC Sit to Stand: Min assist Stand pivot transfers: Min assist         General transfer comment: min assist to rise, min assist with single UE support to pivot from bed to chair. HR 130-140 with pt unable to tolerate further mobility    Ambulation/Gait               General Gait Details: unable  Stairs            Wheelchair Mobility    Modified Rankin (Stroke Patients Only)       Balance Overall balance assessment: Needs assistance   Sitting balance-Leahy Scale: Fair     Standing balance support: Single extremity supported Standing balance-Leahy Scale: Poor                               Pertinent Vitals/Pain Pain Assessment Pain Assessment: No/denies pain    Home Living Family/patient expects to be discharged to:: Skilled nursing facility Living Arrangements: Spouse/significant other Available Help at Discharge: Family Type of Home: Independent living facility Home Access: Level entry       Home Layout: One level Home Equipment: None Additional Comments: Pt lives at [facility] with spouse and plans on SNF at D/C    Prior Function Prior Level of Function : Independent/Modified Independent  Hand Dominance        Extremity/Trunk Assessment   Upper Extremity Assessment Upper Extremity Assessment: Generalized weakness    Lower Extremity Assessment Lower Extremity Assessment: Generalized weakness    Cervical / Trunk Assessment Cervical / Trunk Assessment: Kyphotic  Communication   Communication: No difficulties  Cognition Arousal/Alertness: Awake/alert Behavior During Therapy: WFL for tasks  assessed/performed Overall Cognitive Status: Within Functional Limits for tasks assessed                                          General Comments      Exercises General Exercises - Lower Extremity Long Arc Quad: AROM, Both, Seated, 10 reps Hip Flexion/Marching: AROM, Both, Seated, 10 reps   Assessment/Plan    PT Assessment Patient needs continued PT services  PT Problem List Decreased strength;Decreased mobility;Decreased activity tolerance;Decreased balance;Decreased knowledge of use of DME       PT Treatment Interventions DME instruction;Therapeutic activities;Gait training;Therapeutic exercise;Patient/family education;Functional mobility training;Balance training    PT Goals (Current goals can be found in the Care Plan section)  Acute Rehab PT Goals Patient Stated Goal: return to walking and my apartment after rehab PT Goal Formulation: With patient/family Time For Goal Achievement: 06/21/22 Potential to Achieve Goals: Fair    Frequency Min 3X/week     Co-evaluation               AM-PAC PT "6 Clicks" Mobility  Outcome Measure Help needed turning from your back to your side while in a flat bed without using bedrails?: A Little Help needed moving from lying on your back to sitting on the side of a flat bed without using bedrails?: A Little Help needed moving to and from a bed to a chair (including a wheelchair)?: A Little Help needed standing up from a chair using your arms (e.g., wheelchair or bedside chair)?: A Little Help needed to walk in hospital room?: A Lot Help needed climbing 3-5 steps with a railing? : Total 6 Click Score: 15    End of Session   Activity Tolerance: Patient tolerated treatment well Patient left: in chair;with call bell/phone within reach;with chair alarm set;with family/visitor present Nurse Communication: Mobility status PT Visit Diagnosis: Other abnormalities of gait and mobility (R26.89);Muscle weakness  (generalized) (M62.81)    Time: [id number] PT Time Calculation (min) (ACUTE ONLY): 24 min   Charges:   PT Evaluation $PT Eval Moderate Complexity: 1 Mod          Annalese Stiner P, PT Acute Rehabilitation Services Office: [phone]   Lamarr Lulas 06/07/2022, 12:38 PM

## 2022-06-07 NOTE — Evaluation (Signed)
Occupational Therapy Evaluation Patient Details Name: Sheena Taylor MRN: [id number] DOB: July 23, 1934 Today's Date: 06/07/2022   History of Present Illness 88 yo female admitted 2/18 from [place] with CP, SOB, AFib with RVR and pericardial effusion s/p pericardiocentesis same date. PMHx; Afib, HTN, bil TKA   Clinical Impression   Patient admitted for the diagnosis above.  PTA she lives with her spouse at a local ILF, and the patient needed no assist for ADL, iADL or mobility.  Currently she is needing up to Min A for basic mobility with increased shortness of breath and elevated HR, and up to Mod A for lower body ADL.  OT is indicated in the acute setting to address deficits listed, and assist with transition to SNF for continued rehab to maximize her functional status.        Recommendations for follow up therapy are one component of a multi-disciplinary discharge planning process, led by the attending physician.  Recommendations may be updated based on patient status, additional functional criteria and insurance authorization.   Follow Up Recommendations  Skilled nursing-short term rehab (<3 hours/day)     Assistance Recommended at Discharge Frequent or constant Supervision/Assistance  Patient can return home with the following Assist for transportation;Assistance with cooking/housework;A lot of help with bathing/dressing/bathroom;A lot of help with walking and/or transfers    Functional Status Assessment  Patient has had a recent decline in their functional status and demonstrates the ability to make significant improvements in function in a reasonable and predictable amount of time.  Equipment Recommendations  None recommended by OT    Recommendations for Other Services       Precautions / Restrictions Precautions Precautions: Fall;Other (comment) Precaution Comments: watch HR Restrictions Weight Bearing Restrictions: No      Mobility Bed Mobility Overal bed  mobility: Needs Assistance Bed Mobility: Supine to Sit, Sit to Supine     Supine to sit: Min assist Sit to supine: Min assist        Transfers Overall transfer level: Needs assistance   Transfers: Sit to/from Stand, Bed to chair/wheelchair/BSC Sit to Stand: Min assist     Step pivot transfers: Min assist            Balance Overall balance assessment: Needs assistance Sitting-balance support: Feet supported Sitting balance-Leahy Scale: Good     Standing balance support: Reliant on assistive device for balance Standing balance-Leahy Scale: Poor                             ADL either performed or assessed with clinical judgement   ADL Overall ADL's : Needs assistance/impaired Eating/Feeding: Set up;Bed level   Grooming: Wash/dry hands;Wash/dry face;Set up;Sitting   Upper Body Bathing: Minimal assistance;Sitting   Lower Body Bathing: Moderate assistance;Sit to/from stand   Upper Body Dressing : Minimal assistance;Sitting   Lower Body Dressing: Moderate assistance;Sit to/from stand   Toilet Transfer: Minimal assistance;Stand-pivot;Rolling walker (2 wheels);BSC/3in1                   Vision Patient Visual Report: No change from baseline       Perception     Praxis      Pertinent Vitals/Pain Pain Assessment Pain Assessment: No/denies pain     Hand Dominance Right   Extremity/Trunk Assessment Upper Extremity Assessment Upper Extremity Assessment: Generalized weakness   Lower Extremity Assessment Lower Extremity Assessment: Defer to PT evaluation   Cervical / Trunk Assessment Cervical /  Trunk Assessment: Kyphotic   Communication Communication Communication: No difficulties   Cognition Arousal/Alertness: Awake/alert Behavior During Therapy: WFL for tasks assessed/performed Overall Cognitive Status: Within Functional Limits for tasks assessed                                       General Comments  SpO2 93% on  RA with pivotal transfer; HR 130's bpm with exertional task and down to ~110-115 bpm resting.  Pretty close to similar numbers post thoracentesis.      Exercises     Shoulder Instructions      Home Living Family/patient expects to be discharged to:: Private residence Living Arrangements: Spouse/significant other Available Help at Discharge: Family Type of Home: Independent living facility Home Access: Level entry     Home Layout: One level     Bathroom Shower/Tub: Occupational psychologist: Handicapped height Bathroom Accessibility: Yes How Accessible: Accessible via walker;Accessible via wheelchair Home Equipment: None   Additional Comments: Pt lives at [facility] with spouse and plans on SNF at D/C      Prior Functioning/Environment Prior Level of Function : Independent/Modified Independent                        OT Problem List: Decreased strength;Decreased activity tolerance;Impaired balance (sitting and/or standing)      OT Treatment/Interventions: Self-care/ADL training;Therapeutic exercise;Therapeutic activities;Patient/family education;Balance training;DME and/or AE instruction    OT Goals(Current goals can be found in the care plan section) Acute Rehab OT Goals Patient Stated Goal: Return home OT Goal Formulation: With patient Time For Goal Achievement: 06/21/22 Potential to Achieve Goals: Good ADL Goals Pt Will Perform Grooming: with supervision;standing Pt Will Perform Lower Body Dressing: with supervision;sit to/from stand Pt Will Transfer to Toilet: with supervision;ambulating;regular height toilet Pt/caregiver will Perform Home Exercise Program: Increased strength;Both right and left upper extremity;With Supervision;With theraband  OT Frequency: Min 2X/week    Co-evaluation              AM-PAC OT "6 Clicks" Daily Activity     Outcome Measure Help from another person eating meals?: None Help from another person taking care  of personal grooming?: A Little Help from another person toileting, which includes using toliet, bedpan, or urinal?: A Little Help from another person bathing (including washing, rinsing, drying)?: A Lot Help from another person to put on and taking off regular upper body clothing?: A Little Help from another person to put on and taking off regular lower body clothing?: A Lot 6 Click Score: 17   End of Session Equipment Utilized During Treatment: Gait belt;Rolling walker (2 wheels) Nurse Communication: Mobility status  Activity Tolerance: Patient limited by fatigue Patient left: in bed;with call bell/phone within reach;with family/visitor present  OT Visit Diagnosis: Unsteadiness on feet (R26.81);Muscle weakness (generalized) (M62.81)                Time: [id number] OT Time Calculation (min): 19 min Charges:  OT General Charges $OT Visit: 1 Visit OT Evaluation $OT Eval Moderate Complexity: 1 Mod  06/07/2022  RP, OTR/L  Acute Rehabilitation Services  Office:  [phone]   Metta Clines 06/07/2022, 4:42 PM

## 2022-06-07 NOTE — Progress Notes (Signed)
Subjective:  Pleuritic pain with mild shortness of breath  Increased stool frequency, not watery No fever   Current Facility-Administered Medications:    0.9 %  sodium chloride infusion, 250 mL, Intravenous, Continuous, Sherwood Gambler, MD, Stopped at 06/03/22 0821   0.9 %  sodium chloride infusion, 250 mL, Intravenous, PRN, Zackeriah Kissler J, MD   acetaminophen (TYLENOL) tablet 1,000 mg, 1,000 mg, Oral, Q8H, Philena Obey J, MD, 1,000 mg at 06/07/22 0700   acetaminophen (TYLENOL) tablet 650 mg, 650 mg, Oral, Q4H PRN, Devyne Hauger J, MD, 650 mg at 06/05/22 1943   [COMPLETED] amiodarone (NEXTERONE) 1.8 mg/mL load via infusion 150 mg, 150 mg, Intravenous, Once, 150 mg at 06/06/22 0942 **FOLLOWED BY** [EXPIRED] amiodarone (NEXTERONE PREMIX) 360-4.14 MG/200ML-% (1.8 mg/mL) IV infusion, 60 mg/hr, Intravenous, Continuous, Last Rate: 33.3 mL/hr at 06/06/22 1235, 60 mg/hr at 06/06/22 1235 **FOLLOWED BY** amiodarone (NEXTERONE PREMIX) 360-4.14 MG/200ML-% (1.8 mg/mL) IV infusion, 30 mg/hr, Intravenous, Continuous, Huxley Vanwagoner J, MD, Last Rate: 16.67 mL/hr at 06/07/22 1029, 30 mg/hr at 06/07/22 1029   Chlorhexidine Gluconate Cloth 2 % PADS 6 each, 6 each, Topical, Daily, Yaroslav Gombos J, MD, 6 each at 06/07/22 1000   colchicine tablet 0.6 mg, 0.6 mg, Oral, BID, Izaha Shughart J, MD, 0.6 mg at 06/07/22 1035   diltiazem (CARDIZEM SR) 12 hr capsule 120 mg, 120 mg, Oral, Q12H, Yailene Badia J, MD, 120 mg at 06/07/22 1147   furosemide (LASIX) injection 40 mg, 40 mg, Intravenous, Daily, Yves Fodor J, MD, 40 mg at 06/07/22 1037   guaiFENesin tablet 200 mg, 200 mg, Oral, Q6H PRN, Dao Memmott J, MD, 200 mg at 06/07/22 1035   ibuprofen (ADVIL) tablet 600 mg, 600 mg, Oral, Q8H PRN, Roshanda Balazs J, MD   melatonin tablet 5 mg, 5 mg, Oral, QHS PRN, Ruben Mahler J, MD, 5 mg at 06/05/22 2309   metoprolol tartrate (LOPRESSOR) tablet 25 mg, 25 mg, Oral, BID,  Tamula Morrical J, MD, 25 mg at 06/07/22 1035   ondansetron (ZOFRAN) injection 4 mg, 4 mg, Intravenous, Q6H PRN, Mads Borgmeyer J, MD   Oral care mouth rinse, 15 mL, Mouth Rinse, PRN, Cionna Collantes J, MD   potassium chloride SA (KLOR-CON M) CR tablet 40 mEq, 40 mEq, Oral, Q4H, Kynzlee Hucker J, MD, 40 mEq at 06/07/22 1147   sodium chloride flush (NS) 0.9 % injection 3 mL, 3 mL, Intravenous, Q12H, Koleen Celia J, MD, 3 mL at 06/07/22 1152   sodium chloride flush (NS) 0.9 % injection 3 mL, 3 mL, Intravenous, PRN, Kenedie Dirocco J, MD, 3 mL at 06/05/22 2309   Objective:  Vital Signs in the last 24 hours: Temp:  [98.1 F (36.7 C)-98.5 F (36.9 C)] 98.1 F (36.7 C) (02/22 0715) Pulse Rate:  [108-136] 136 (02/22 0715) Resp:  [17-20] 20 (02/22 0715) BP: (116-151)/(68-88) 151/88 (02/22 0715) SpO2:  [93 %-98 %] 98 % (02/22 0715)  Intake/Output from previous day: 02/21 0701 - 02/22 0700 In: 439.8 [I.V.:439.8] Out: -   Physical Exam Vitals and nursing note reviewed.  Constitutional:      General: She is not in acute distress. Neck:     Vascular: No JVD.  Cardiovascular:     Rate and Rhythm: Tachycardia present. Rhythm irregular.     Heart sounds: Normal heart sounds. No murmur heard. Pulmonary:     Effort: Pulmonary effort is normal.     Breath sounds: Transmitted upper airway sounds present. Decreased breath sounds (at bases) present. No wheezing or rales.  Imaging/tests reviewed and independently interpreted:  Cardiac MRI 06/04/2022: 1. Large complex pericardial effusion localized around LV lateral wall, measures up to 28m. There is pericardial LGE and hyperenhancement on T2 STIR imaging consistent with pericardial inflammation   2. Normal LV size, normal wall thickness, and low normal systolic function (EF [id number]. No LGE to suggest myocardial scar   3.  Normal RV size with mild systolic dysfunction (EF [id number]    CXR 06/04/2022: COMPARISON:  Chest  radiograph dated 06/03/2022   FINDINGS: Normal lung volumes. Patchy left retrocardiac opacities. No pleural effusion or pneumothorax. Similar enlarged cardiomediastinal silhouette. The visualized skeletal structures are unremarkable.   IMPRESSION: Patchy left retrocardiac opacities, which may represent atelectasis or infection.    Cardiac Studies:  Telemetry 06/04/2022: Afib, rate 90-110  EKG 06/03/2022: Atrial fibrillation w/RVR Low voltage, extremity and precordial leads Prolonged QT interval  Echocardiogram 06/04/2022: EF 55-60%. Septal flattening due to RVPO Mild MR, mod TR, mild PH Small posterior pericardial effusion  Pericardiocentesis 2/18: Ultrasound guided pericardiocentesis through apical access. 600 cc of hemorrhagic fluid drained.  Assessment & Recommendations:  88y.o. Caucasian female with hypertension, arthritis, paroxysmal atrial fibrillation, large pericardial effusion   Pericardial effusion: Cardiac tamponade on 2/18, underwent pericardiocentesis w/600 cc hemorrhagic pericardial fluid drained. No infectious findings. Cytology negative for malignancy Residual large loculation of complex pericardial fluid, mostly lateral and posterior (MRI 06/04/2022). This is not drainable by repeat pericardiocentesis. Discussed with Dr. LKipp Brood We decided to pursue conservative approach for now in absence of tamponade.  Continue incentive spirometry. IV lasix 40 mg daily for now. Given persistent pleural effusion and dyspnea, recommend thoracentesis. Patient is agreeable now.  Continue to hold Eliquis for now given bloody effusion.  Underlying inflammation evident on MRI. Started n ibuprofen 600 mg tid for now. Continue colchicine 0.6 mg bid.  Afib: Continue diltiazem, increase 120 mg bid. Continue IV amiodarone. Added PO metoprolol tartrate 25 mg bid.  PT/OT if HR can tolerate [facility] work consult for discharge  planning.    MNigel Mormon MD Pager: [phone]: [phone]

## 2022-06-07 NOTE — Procedures (Signed)
PROCEDURE SUMMARY:  Successful US guided right thoracentesis. Yielded 650 mL of clear yellow fluid. Patient tolerated procedure well. No immediate complications. EBL = trace  Specimen was sent for labs.  Post procedure chest X-ray reveals no pneumothorax  Jakaree Pickard S Nakai Pollio PA-C 06/07/2022 12:57 PM

## 2022-06-07 NOTE — Progress Notes (Addendum)
Physical Therapy Treatment Patient Details Name: Sheena Taylor MRN: [id number] DOB: 11/08/1934 Today's Date: 06/07/2022   History of Present Illness 88 yo female admitted 2/18 from [facility] with CP, SOB, AFib with RVR and pericardial effusion s/p pericardiocentesis same date. PMHx; Afib, HTN, bil TKA    PT Comments    Pt received in chair, NT requesting PTA assist pt to return safely from chair to bed as transport tech imminently arriving to take pt off floor (PT evaluated pt just prior to pt working with PTA). Pt HR elevated to mid-130's bpm with exertion and 110-115 bpm resting. SpO2 93% on RA with exertion and pt not significantly dyspneic. Pt given handouts for supine LE exercises and use of IS at end of session to reinforce. Pt preparing to be transported off unit at end of session, heels floated for pressure relief. Pt continues to benefit from PT services to progress toward functional mobility goals.   Recommendations for follow up therapy are one component of a multi-disciplinary discharge planning process, led by the attending physician.  Recommendations may be updated based on patient status, additional functional criteria and insurance authorization.  Follow Up Recommendations  Skilled nursing-short term rehab (<3 hours/day) Can patient physically be transported by private vehicle: No   Assistance Recommended at Discharge Frequent or constant Supervision/Assistance  Patient can return home with the following A lot of help with walking and/or transfers;A little help with bathing/dressing/bathroom;Assistance with cooking/housework;Direct supervision/assist for medications management;Assist for transportation   Equipment Recommendations  Rolling walker (2 wheels);BSC/3in1    Recommendations for Other Services       Precautions / Restrictions Precautions Precautions: Fall;Other (comment) Precaution Comments: watch HR Restrictions Weight Bearing Restrictions: No      Mobility  Bed Mobility Overal bed mobility: Needs Assistance Bed Mobility: Sit to Supine       Sit to supine: Min assist, +2 for safety/equipment   General bed mobility comments: min assist to roll for bed pan removal and pericare. Min guard with rail and increased time to transition to sitting with HOB 20 degrees    Transfers Overall transfer level: Needs assistance   Transfers: Sit to/from Stand, Bed to chair/wheelchair/BSC Sit to Stand: Min assist, +2 safety/equipment   Step pivot transfers: Min assist, +2 physical assistance       General transfer comment: min assist +2 to stand from chair and with BUE HHA support to pivot from bed to chair and turn 360 degrees to avoid sitting on IV line (pole stuck behind bed/chair). HR up to mid-130's bpm and transport entering room to take her off floor at end of session.    Ambulation/Gait Ambulation/Gait assistance: Min assist, +2 physical assistance   Assistive device: 2 person hand held assist Gait Pattern/deviations: Shuffle, Step-to pattern, Leaning posteriorly, Trunk flexed     Pre-gait activities: ~59f pivotal steps from chair>bed; tachy, transport arriving to room General Gait Details: unable     Balance Overall balance assessment: Needs assistance Sitting-balance support: Single extremity supported Sitting balance-Leahy Scale: Fair Sitting balance - Comments: limited assessment; pt able to side scoot toward HOB without LOB with BUE support   Standing balance support: Bilateral upper extremity supported Standing balance-Leahy Scale: Poor Standing balance comment: +2 HHA for step pivot transfer                            Cognition Arousal/Alertness: Awake/alert Behavior During Therapy: WFL for tasks assessed/performed Overall Cognitive Status: Within  Functional Limits for tasks assessed                                 General Comments: spouse present and encouraging         Exercises Other Exercises Other Exercises: supine BLE HEP handout brought to room at end of session for pt to work on between sessions, spouse receptive and accepting handout Other Exercises: IS handout brought to room to reinforce safe technique and so she can track her efforts on chart    General Comments General comments (skin integrity, edema, etc.): SpO2 93% on RA with pivotal transfer; HR 130's bpm with exertional task and down to ~110-115 bpm resting      Pertinent Vitals/Pain Pain Assessment Pain Assessment: No/denies pain    Home Living Family/patient expects to be discharged to:: Skilled nursing facility Living Arrangements: Spouse/significant other Available Help at Discharge: Family Type of Home: Independent living facility Home Access: Level entry       Home Layout: One level Home Equipment: None Additional Comments: Pt lives at [facility] with spouse and plans on SNF at D/C    Prior Function            PT Goals (current goals can now be found in the care plan section) Acute Rehab PT Goals Patient Stated Goal: return to walking and my apartment after rehab PT Goal Formulation: With patient/family Time For Goal Achievement: 06/21/22 Potential to Achieve Goals: Fair Progress towards PT goals: Progressing toward goals    Frequency    Min 3X/week      PT Plan Current plan remains appropriate       AM-PAC PT "6 Clicks" Mobility   Outcome Measure  Help needed turning from your back to your side while in a flat bed without using bedrails?: A Little Help needed moving from lying on your back to sitting on the side of a flat bed without using bedrails?: A Lot (anticipated from flat bed without rails) Help needed moving to and from a bed to a chair (including a wheelchair)?: A Little Help needed standing up from a chair using your arms (e.g., wheelchair or bedside chair)?: A Little Help needed to walk in hospital room?: A Lot Help needed climbing 3-5  steps with a railing? : Total 6 Click Score: 14    End of Session Equipment Utilized During Treatment: Gait belt Activity Tolerance: Patient tolerated treatment well;Treatment limited secondary to medical complications (Comment);Other (comment) (tachycardia limiting standing tolerance; transport arriving to the room) Patient left: with call bell/phone within reach;in bed;Other (comment);with family/visitor present (in care of transport tech; spouse in room) Nurse Communication: Mobility status;Precautions (pt HR) PT Visit Diagnosis: Other abnormalities of gait and mobility (R26.89);Muscle weakness (generalized) (M62.81)     Time: 1216-1224 PT Time Calculation (min) (ACUTE ONLY): 8 min  Charges:  $Therapeutic Activity: 8-22 mins                     Jannifer Fischler P., PTA Acute Rehabilitation Services Secure Chat Preferred 9a-5:30pm Office: [place] 06/07/2022, 2:07 PM

## 2022-06-08 LAB — CULTURE, BLOOD (ROUTINE X 2)
Culture: NO GROWTH
Culture: NO GROWTH

## 2022-06-08 MED ORDER — PANTOPRAZOLE SODIUM 40 MG PO TBEC
40.0000 mg | DELAYED_RELEASE_TABLET | Freq: Every day | ORAL | Status: DC
  Administered 2022-06-09 – 2022-06-12 (×4): 40 mg via ORAL
  Filled 2022-06-08 (×3): qty 1

## 2022-06-08 NOTE — Progress Notes (Signed)
Mobility Specialist Progress Note:   06/08/22 1047  Mobility  Activity Refused mobility   Pt wanting to drink coffee before getting up to chair. Will follow-up as time allows.   Gareth Eagle Taden Witter Mobility Specialist Please contact via [employer] or  Rehab Office at [phone]

## 2022-06-08 NOTE — Progress Notes (Signed)
Subjective:  Breathing improved.  Heart rate improved overnight after 650 cc thoracentesis from right side.  However, she has had pain at the site of thoracentesis, primary operator.  Current Facility-Administered Medications:    0.9 %  sodium chloride infusion, 250 mL, Intravenous, Continuous, Sherwood Gambler, MD, Stopped at 06/03/22 0821   0.9 %  sodium chloride infusion, 250 mL, Intravenous, PRN, Chick Cousins J, MD   acetaminophen (TYLENOL) tablet 1,000 mg, 1,000 mg, Oral, Q8H, Delfin Squillace J, MD, 1,000 mg at 06/07/22 2139   acetaminophen (TYLENOL) tablet 650 mg, 650 mg, Oral, Q4H PRN, Pierce Biagini J, MD, 650 mg at 06/05/22 1943   [COMPLETED] amiodarone (NEXTERONE) 1.8 mg/mL load via infusion 150 mg, 150 mg, Intravenous, Once, 150 mg at 06/06/22 0942 **FOLLOWED BY** [EXPIRED] amiodarone (NEXTERONE PREMIX) 360-4.14 MG/200ML-% (1.8 mg/mL) IV infusion, 60 mg/hr, Intravenous, Continuous, Last Rate: 33.3 mL/hr at 06/06/22 1235, 60 mg/hr at 06/06/22 1235 **FOLLOWED BY** amiodarone (NEXTERONE PREMIX) 360-4.14 MG/200ML-% (1.8 mg/mL) IV infusion, 30 mg/hr, Intravenous, Continuous, Sharnay Cashion J, MD, Last Rate: 16.67 mL/hr at 06/08/22 0111, 30 mg/hr at 06/08/22 0111   Chlorhexidine Gluconate Cloth 2 % PADS 6 each, 6 each, Topical, Daily, Adon Gehlhausen J, MD, 6 each at 06/07/22 1000   colchicine tablet 0.6 mg, 0.6 mg, Oral, BID, Cove Haydon J, MD, 0.6 mg at 06/07/22 2142   diltiazem (CARDIZEM SR) 12 hr capsule 120 mg, 120 mg, Oral, Q12H, Michella Detjen J, MD, 120 mg at 06/07/22 2143   furosemide (LASIX) injection 40 mg, 40 mg, Intravenous, Daily, Xion Debruyne J, MD, 40 mg at 06/07/22 1037   guaiFENesin tablet 200 mg, 200 mg, Oral, Q6H PRN, Paytin Ramakrishnan J, MD, 200 mg at 06/07/22 2138   ibuprofen (ADVIL) tablet 600 mg, 600 mg, Oral, Q8H PRN, Taysen Bushart J, MD, 600 mg at 06/07/22 2030   melatonin tablet 5 mg, 5 mg, Oral, QHS PRN, Rahshawn Remo J,  MD, 5 mg at 06/05/22 2309   metoprolol tartrate (LOPRESSOR) tablet 25 mg, 25 mg, Oral, BID, Shaolin Armas J, MD, 25 mg at 06/07/22 2140   ondansetron (ZOFRAN) injection 4 mg, 4 mg, Intravenous, Q6H PRN, Crysta Gulick J, MD   Oral care mouth rinse, 15 mL, Mouth Rinse, PRN, Glorimar Stroope J, MD   oxyCODONE (Oxy IR/ROXICODONE) immediate release tablet 2.5 mg, 2.5 mg, Oral, Once PRN, Osude, Nkiru C, MD   sodium chloride flush (NS) 0.9 % injection 3 mL, 3 mL, Intravenous, Q12H, Deontrae Drinkard J, MD, 3 mL at 06/07/22 2143   sodium chloride flush (NS) 0.9 % injection 3 mL, 3 mL, Intravenous, PRN, Jourden Delmont J, MD, 3 mL at 06/05/22 2309   Objective:  Vital Signs in the last 24 hours: Temp:  [97.9 F (36.6 C)-98.3 F (36.8 C)] 98 F (36.7 C) (02/23 0449) Pulse Rate:  [91-136] 100 (02/23 0449) Resp:  [20-25] 20 (02/23 0449) BP: (111-151)/(64-88) 116/64 (02/23 0449) SpO2:  [94 %-98 %] 95 % (02/23 0449)  Intake/Output from previous day: 02/22 0701 - 02/23 0700 In: 608.1 [P.O.:240; I.V.:368.1] Out: 1000 [Urine:1000]  Physical Exam Vitals and nursing note reviewed.  Constitutional:      General: She is not in acute distress. Neck:     Vascular: No JVD.  Cardiovascular:     Rate and Rhythm: Tachycardia present. Rhythm irregular.     Heart sounds: Normal heart sounds. No murmur heard. Pulmonary:     Effort: Pulmonary effort is normal.     Breath sounds: Transmitted upper airway  sounds present. Decreased breath sounds (at bases) present. No wheezing or rales.      Imaging/tests reviewed and independently interpreted:  Cardiac MRI 06/04/2022: 1. Large complex pericardial effusion localized around LV lateral wall, measures up to 31m. There is pericardial LGE and hyperenhancement on T2 STIR imaging consistent with pericardial inflammation   2. Normal LV size, normal wall thickness, and low normal systolic function (EF [id number]. No LGE to suggest myocardial scar    3.  Normal RV size with mild systolic dysfunction (EF [id number]    CXR 06/04/2022: COMPARISON:  Chest radiograph dated 06/03/2022   FINDINGS: Normal lung volumes. Patchy left retrocardiac opacities. No pleural effusion or pneumothorax. Similar enlarged cardiomediastinal silhouette. The visualized skeletal structures are unremarkable.   IMPRESSION: Patchy left retrocardiac opacities, which may represent atelectasis or infection.    Cardiac Studies:  Telemetry 06/04/2022: Afib, rate 90-110  EKG 06/03/2022: Atrial fibrillation w/RVR Low voltage, extremity and precordial leads Prolonged QT interval  Echocardiogram 06/04/2022: EF 55-60%. Septal flattening due to RVPO Mild MR, mod TR, mild PH Small posterior pericardial effusion  Pericardiocentesis 2/18: Ultrasound guided pericardiocentesis through apical access. 600 cc of hemorrhagic fluid drained.  Assessment & Recommendations:  88y.o. Caucasian female with hypertension, arthritis, paroxysmal atrial fibrillation, large pericardial effusion   Pericardial effusion: Cardiac tamponade on 2/18, underwent pericardiocentesis w/600 cc hemorrhagic pericardial fluid drained. No infectious findings. Cytology negative for malignancy Residual large loculation of complex pericardial fluid, mostly lateral and posterior (MRI 06/04/2022). This is not drainable by repeat pericardiocentesis. Discussed with Dr. LKipp Brood We decided to pursue conservative approach for now in absence of tamponade.  Continue incentive spirometry. IV lasix 40 mg daily for now. S/p Rt sided thoracentesis 2/22, likely transudative.  Continue to hold Eliquis for now given bloody effusion.  Underlying inflammation evident on MRI. Started n ibuprofen 600 mg tid for now.  Added pantoprazole 40 mg daily. I may change ibuprofen to aspirin on discharge. Continue colchicine 0.6 mg bid.  Afib: Continue diltiazem 120 mg bid, metoprolol tartrate 25 mg twice daily. Will opt  to transition from IV amiodarone to p.o. amiodarone 200 mg twice daily over the next 2 days.  PT/OT if HR can tolerate [facility].  At the current moment, she may need short-term rehab.  Pain that she relates to her over the next 2-3 days [place] [facility]  Social work consult for discharge planning.    MNigel Mormon MD Pager: [phone]: [phone]

## 2022-06-08 NOTE — Progress Notes (Signed)
Mobility Specialist Progress Note:   06/08/22 1141  Mobility  Activity Ambulated with assistance in room;Transferred from bed to chair  Level of Assistance Minimal assist, patient does 75% or more  Assistive Device  (HHA)  Distance Ambulated (ft) 2 ft  Activity Response Tolerated well  $Mobility charge 1 Mobility   During Mobility: 111 HR Post Mobility:   99 HR  Pt in bed willing to get up to chair. Complaints of R side pain. MinA to stand from bed. Left in chair with call bell in reach and all needs met.   Gareth Eagle Jillyn Stacey Mobility Specialist Please contact via [employer] or  Rehab Office at [phone]

## 2022-06-09 DIAGNOSIS — I4891 Unspecified atrial fibrillation: Secondary | ICD-10-CM

## 2022-06-09 DIAGNOSIS — I300 Acute nonspecific idiopathic pericarditis: Secondary | ICD-10-CM

## 2022-06-09 DIAGNOSIS — E871 Hypo-osmolality and hyponatremia: Secondary | ICD-10-CM

## 2022-06-09 DIAGNOSIS — I4819 Other persistent atrial fibrillation: Secondary | ICD-10-CM

## 2022-06-09 DIAGNOSIS — E876 Hypokalemia: Secondary | ICD-10-CM

## 2022-06-09 DIAGNOSIS — J90 Pleural effusion, not elsewhere classified: Secondary | ICD-10-CM

## 2022-06-09 LAB — BASIC METABOLIC PANEL
Anion gap: 12 (ref 5–15)
BUN: 26 mg/dL — ABNORMAL HIGH (ref 8–23)
CO2: 22 mmol/L (ref 22–32)
Calcium: 8.4 mg/dL — ABNORMAL LOW (ref 8.9–10.3)
Chloride: 96 mmol/L — ABNORMAL LOW (ref 98–111)
Creatinine, Ser: 0.94 mg/dL (ref 0.44–1.00)
GFR, Estimated: 59 mL/min — ABNORMAL LOW (ref >60)
Glucose, Bld: 104 mg/dL — ABNORMAL HIGH (ref 70–99)
Potassium: 3.4 mmol/L — ABNORMAL LOW (ref 3.5–5.1)
Sodium: 130 mmol/L — ABNORMAL LOW (ref 135–145)

## 2022-06-09 LAB — CBC
HCT: 32.2 % — ABNORMAL LOW (ref 36.0–46.0)
Hemoglobin: 11 g/dL — ABNORMAL LOW (ref 12.0–15.0)
MCH: 30.5 pg (ref 26.0–34.0)
MCHC: 34.2 g/dL (ref 30.0–36.0)
MCV: 89.2 fL (ref 80.0–100.0)
Platelets: 516 10*3/uL — ABNORMAL HIGH (ref 150–400)
RBC: 3.61 MIL/uL — ABNORMAL LOW (ref 3.87–5.11)
RDW: 13.5 % (ref 11.5–15.5)
WBC: 17.3 10*3/uL — ABNORMAL HIGH (ref 4.0–10.5)
nRBC: 0 % (ref 0.0–0.2)

## 2022-06-09 MED ORDER — AMIODARONE HCL 200 MG PO TABS
200.0000 mg | ORAL_TABLET | Freq: Two times a day (BID) | ORAL | Status: DC
  Administered 2022-06-10 – 2022-06-12 (×5): 200 mg via ORAL
  Filled 2022-06-09 (×5): qty 1

## 2022-06-09 MED ORDER — METOPROLOL TARTRATE 25 MG PO TABS
25.0000 mg | ORAL_TABLET | Freq: Three times a day (TID) | ORAL | Status: DC
  Administered 2022-06-09 – 2022-06-10 (×4): 25 mg via ORAL
  Filled 2022-06-09 (×4): qty 1

## 2022-06-09 MED ORDER — ASPIRIN 325 MG PO TBEC
325.0000 mg | DELAYED_RELEASE_TABLET | Freq: Three times a day (TID) | ORAL | Status: DC
  Administered 2022-06-09 – 2022-06-12 (×10): 325 mg via ORAL
  Filled 2022-06-09 (×10): qty 1

## 2022-06-09 MED ORDER — POTASSIUM CHLORIDE 20 MEQ PO PACK
40.0000 meq | PACK | Freq: Every day | ORAL | Status: DC
  Administered 2022-06-09 – 2022-06-12 (×4): 40 meq via ORAL
  Filled 2022-06-09 (×4): qty 2

## 2022-06-09 MED ORDER — ENSURE ENLIVE PO LIQD
237.0000 mL | Freq: Two times a day (BID) | ORAL | Status: DC
  Administered 2022-06-10 – 2022-06-11 (×2): 237 mL via ORAL

## 2022-06-09 NOTE — Progress Notes (Signed)
Mobility Specialist Progress Note:   06/09/22 0940  Mobility  Activity Ambulated with assistance in room;Stood at bedside  Level of Assistance Contact guard assist, steadying assist  Assistive Device Front wheel walker  Distance Ambulated (ft) 3 ft  Activity Response Tolerated poorly  Mobility Referral Yes  $Mobility charge 1 Mobility   Pt agreeable to mobility with min encouragement. Required minG assist to stand with RW. After taking first few steps, pt stating she feels "sick" and "can't do it". Declined further mobility. Pt left in chair with all needs met. Will try again if time allows.  Nelta Numbers Mobility Specialist Please contact via [employer] or  Rehab office at [phone]

## 2022-06-09 NOTE — Progress Notes (Signed)
Subjective:  Sitting upright.   Accompanied by her husband, daughter, son-in-law. Denies chest pain. Shortness of breath improving. Feeling tired and fatigued.   Current Facility-Administered Medications:    0.9 %  sodium chloride infusion, 250 mL, Intravenous, Continuous, Sherwood Gambler, MD, Stopped at 06/03/22 0821   0.9 %  sodium chloride infusion, 250 mL, Intravenous, PRN, Patwardhan, Manish J, MD   acetaminophen (TYLENOL) tablet 650 mg, 650 mg, Oral, Q4H PRN, Patwardhan, Manish J, MD, 650 mg at 06/05/22 1943   [COMPLETED] amiodarone (NEXTERONE) 1.8 mg/mL load via infusion 150 mg, 150 mg, Intravenous, Once, 150 mg at 06/06/22 0942 **FOLLOWED BY** [EXPIRED] amiodarone (NEXTERONE PREMIX) 360-4.14 MG/200ML-% (1.8 mg/mL) IV infusion, 60 mg/hr, Intravenous, Continuous, Last Rate: 33.3 mL/hr at 06/06/22 1235, 60 mg/hr at 06/06/22 1235 **FOLLOWED BY** amiodarone (NEXTERONE PREMIX) 360-4.14 MG/200ML-% (1.8 mg/mL) IV infusion, 30 mg/hr, Intravenous, Continuous, Patwardhan, Manish J, MD, Last Rate: 16.67 mL/hr at 06/09/22 1502, 30 mg/hr at 06/09/22 1502   aspirin EC tablet 325 mg, 325 mg, Oral, TID WC, Patwardhan, Manish J, MD   Chlorhexidine Gluconate Cloth 2 % PADS 6 each, 6 each, Topical, Daily, Patwardhan, Manish J, MD, 6 each at 06/07/22 1000   colchicine tablet 0.6 mg, 0.6 mg, Oral, BID, Patwardhan, Manish J, MD, 0.6 mg at 06/09/22 0819   diltiazem (CARDIZEM SR) 12 hr capsule 120 mg, 120 mg, Oral, Q12H, Patwardhan, Manish J, MD, 120 mg at 06/09/22 0819   [START ON 06/10/2022] feeding supplement (ENSURE ENLIVE / ENSURE PLUS) liquid 237 mL, 237 mL, Oral, BID BM, Magdalen Cabana, DO   furosemide (LASIX) injection 40 mg, 40 mg, Intravenous, Daily, Patwardhan, Manish J, MD, 40 mg at 06/09/22 [id number]   guaiFENesin tablet 200 mg, 200 mg, Oral, Q6H PRN, Patwardhan, Manish J, MD, 200 mg at 06/09/22 1325   melatonin tablet 5 mg, 5 mg, Oral, QHS PRN, Patwardhan, Manish J, MD, 5 mg at 06/05/22 2309   metoprolol  tartrate (LOPRESSOR) tablet 25 mg, 25 mg, Oral, TID, Agostino Gorin, DO   ondansetron (ZOFRAN) injection 4 mg, 4 mg, Intravenous, Q6H PRN, Patwardhan, Manish J, MD   Oral care mouth rinse, 15 mL, Mouth Rinse, PRN, Patwardhan, Manish J, MD   oxyCODONE (Oxy IR/ROXICODONE) immediate release tablet 2.5 mg, 2.5 mg, Oral, Once PRN, Osude, Nkiru C, MD   pantoprazole (PROTONIX) EC tablet 40 mg, 40 mg, Oral, Q0600, Patwardhan, Manish J, MD, 40 mg at 06/09/22 0610   potassium chloride (KLOR-CON) packet 40 mEq, 40 mEq, Oral, Daily, Alwaleed Obeso, DO   sodium chloride flush (NS) 0.9 % injection 3 mL, 3 mL, Intravenous, Q12H, Patwardhan, Manish J, MD, 3 mL at 06/09/22 [id number]   sodium chloride flush (NS) 0.9 % injection 3 mL, 3 mL, Intravenous, PRN, Patwardhan, Manish J, MD, 3 mL at 06/05/22 2309   Objective:  Vital Signs in the last 24 hours: Temp:  [97.7 F (36.5 C)-98.9 F (37.2 C)] 98.9 F (37.2 C) (02/24 0810) Pulse Rate:  [97-125] 121 (02/24 0810) Resp:  [19-20] 19 (02/24 0810) BP: (112-140)/(63-85) 119/65 (02/24 0810) SpO2:  [92 %-96 %] 96 % (02/24 0810)  Intake/Output from previous day: 02/23 0701 - 02/24 0700 In: 610.3 [P.O.:240; I.V.:370.3] Out: 1350 [Urine:1350]  Physical Exam Vitals and nursing note reviewed.  Constitutional:      General: She is not in acute distress. Neck:     Vascular: No JVD.  Cardiovascular:     Rate and Rhythm: Tachycardia present. Rhythm irregular.     Heart sounds: Normal heart  sounds. No murmur heard. Pulmonary:     Effort: Pulmonary effort is normal.     Breath sounds: Transmitted upper airway sounds present. Examination of the right-lower field reveals rales. Examination of the left-lower field reveals rales. Decreased breath sounds (at bases) and rales present. No wheezing.  Skin:    Comments: Ecchymoses at the IV sites.    Imaging/tests reviewed and independently interpreted:  Cardiac MRI 06/04/2022: 1. Large complex pericardial effusion localized  around LV lateral wall, measures up to 58m. There is pericardial LGE and hyperenhancement on T2 STIR imaging consistent with pericardial inflammation   2. Normal LV size, normal wall thickness, and low normal systolic function (EF [id number]. No LGE to suggest myocardial scar   3.  Normal RV size with mild systolic dysfunction (EF [id number]    CXR 06/04/2022: COMPARISON:  Chest radiograph dated 06/03/2022   FINDINGS: Normal lung volumes. Patchy left retrocardiac opacities. No pleural effusion or pneumothorax. Similar enlarged cardiomediastinal silhouette. The visualized skeletal structures are unremarkable.   IMPRESSION: Patchy left retrocardiac opacities, which may represent atelectasis or infection.    Cardiac Studies:  Telemetry 06/04/2022: Afib, rate 90-110  EKG 06/03/2022: Atrial fibrillation w/RVR Low voltage, extremity and precordial leads Prolonged QT interval  Echocardiogram 06/04/2022: EF 55-60%. Septal flattening due to RVPO Mild MR, mod TR, mild PH Small posterior pericardial effusion  Pericardiocentesis 2/18: Ultrasound guided pericardiocentesis through apical access. 600 cc of hemorrhagic fluid drained.  Assessment & Recommendations:  88y.o. Caucasian female with hypertension, arthritis, paroxysmal atrial fibrillation, large pericardial effusion   Pericardial effusion /pericarditis: Cardiac tamponade on 2/18, underwent pericardiocentesis w/600 cc hemorrhagic pericardial fluid drained. No infectious findings. Cytology negative for malignancy Residual large loculation of complex pericardial fluid, mostly lateral and posterior (MRI 06/04/2022) due to its location and not ideal for pericardiocentesis.  She has been evaluated by Dr. LKipp Broodfrom cardiothoracic surgery who recommends conservative management at this time. Due to the hemorrhagic pericardial fluid concerns with regards to high-dose ibuprofen and anticoagulation at this time. Will start aspirin 325 p.o. 3  times daily along with pantoprazole, and colchicine Continue incentive spirometry. IV lasix 40 mg daily for now.  Pleural effusion: S/p Rt sided thoracentesis 2/22. Incentive spirometry encouraged. Ensure supplementation with meals. Monitor for now.  Hypokalemia: K-Dur 40 mg p.o. daily  Hyponatremia: Chronic and stable.  Monitor for now  Afib: Rate controlled on telemetry Continue diltiazem 120 mg bid Increase metoprolol to tartrate 25 mg p.o. 3 times daily with holding parameters. Will focus on rate control strategy. Patient is aware to be more cognizant of symptoms of TIA/CVA as she is currently not on anticoagulation given her recent hemorrhagic pericardial effusion.  Will transition IV amiodarone to oral amiodarone tomorrow.   Patient attempted to work with PT earlier this morning but was unable to do so.  Plan of care discussed with patient and family.  SRex Kras DNev[facility] Pager: [phone]: [phone]

## 2022-06-10 LAB — CBC
HCT: 34.1 % — ABNORMAL LOW (ref 36.0–46.0)
Hemoglobin: 11.2 g/dL — ABNORMAL LOW (ref 12.0–15.0)
MCH: 29.6 pg (ref 26.0–34.0)
MCHC: 32.8 g/dL (ref 30.0–36.0)
MCV: 90.2 fL (ref 80.0–100.0)
Platelets: 566 10*3/uL — ABNORMAL HIGH (ref 150–400)
RBC: 3.78 MIL/uL — ABNORMAL LOW (ref 3.87–5.11)
RDW: 13.9 % (ref 11.5–15.5)
WBC: 18.4 10*3/uL — ABNORMAL HIGH (ref 4.0–10.5)
nRBC: 0 % (ref 0.0–0.2)

## 2022-06-10 LAB — BASIC METABOLIC PANEL
Anion gap: 10 (ref 5–15)
BUN: 25 mg/dL — ABNORMAL HIGH (ref 8–23)
CO2: 22 mmol/L (ref 22–32)
Calcium: 8.7 mg/dL — ABNORMAL LOW (ref 8.9–10.3)
Chloride: 97 mmol/L — ABNORMAL LOW (ref 98–111)
Creatinine, Ser: 0.83 mg/dL (ref 0.44–1.00)
GFR, Estimated: >60 mL/min (ref >60)
Glucose, Bld: 116 mg/dL — ABNORMAL HIGH (ref 70–99)
Potassium: 3.7 mmol/L (ref 3.5–5.1)
Sodium: 129 mmol/L — ABNORMAL LOW (ref 135–145)

## 2022-06-10 LAB — MAGNESIUM: Magnesium: 1.7 mg/dL (ref 1.7–2.4)

## 2022-06-10 MED ORDER — METOPROLOL TARTRATE 50 MG PO TABS
50.0000 mg | ORAL_TABLET | Freq: Two times a day (BID) | ORAL | Status: DC
  Administered 2022-06-10 – 2022-06-12 (×4): 50 mg via ORAL
  Filled 2022-06-10 (×4): qty 1

## 2022-06-10 MED ORDER — COLCHICINE 0.6 MG PO TABS
0.6000 mg | ORAL_TABLET | Freq: Every day | ORAL | Status: DC
  Administered 2022-06-11: 0.6 mg via ORAL
  Filled 2022-06-10: qty 1

## 2022-06-10 NOTE — Progress Notes (Signed)
Subjective:  Sitting up in chair resting. No family at bedside. Spoke to her husband over the phone. Denies chest pain. Has had 5 bowel movements over the last 24 hours. Feels tired and fatigue But overall better compared to yesterday   Current Facility-Administered Medications:    0.9 %  sodium chloride infusion, 250 mL, Intravenous, Continuous, Sherwood Gambler, MD, Stopped at 06/03/22 0821   0.9 %  sodium chloride infusion, 250 mL, Intravenous, PRN, Patwardhan, Manish J, MD   acetaminophen (TYLENOL) tablet 650 mg, 650 mg, Oral, Q4H PRN, Patwardhan, Manish J, MD, 650 mg at 06/05/22 1943   amiodarone (PACERONE) tablet 200 mg, 200 mg, Oral, BID, Ronnika Collett, DO, 200 mg at 06/10/22 0831   aspirin EC tablet 325 mg, 325 mg, Oral, TID WC, Patwardhan, Manish J, MD, 325 mg at 06/10/22 1154   [START ON 06/11/2022] colchicine tablet 0.6 mg, 0.6 mg, Oral, Daily, Won Kreuzer, DO   diltiazem (CARDIZEM SR) 12 hr capsule 120 mg, 120 mg, Oral, Q12H, Patwardhan, Manish J, MD, 120 mg at 06/10/22 0831   feeding supplement (ENSURE ENLIVE / ENSURE PLUS) liquid 237 mL, 237 mL, Oral, BID BM, Demario Faniel, DO, 237 mL at 06/10/22 1155   guaiFENesin tablet 200 mg, 200 mg, Oral, Q6H PRN, Patwardhan, Manish J, MD, 200 mg at 06/09/22 2145   melatonin tablet 5 mg, 5 mg, Oral, QHS PRN, Patwardhan, Manish J, MD, 5 mg at 06/09/22 2146   metoprolol tartrate (LOPRESSOR) tablet 50 mg, 50 mg, Oral, BID, Arlee Santosuosso, DO   ondansetron (ZOFRAN) injection 4 mg, 4 mg, Intravenous, Q6H PRN, Patwardhan, Manish J, MD   Oral care mouth rinse, 15 mL, Mouth Rinse, PRN, Patwardhan, Manish J, MD   pantoprazole (PROTONIX) EC tablet 40 mg, 40 mg, Oral, Q0600, Patwardhan, Manish J, MD, 40 mg at 06/10/22 [id number]   potassium chloride (KLOR-CON) packet 40 mEq, 40 mEq, Oral, Daily, Kim Oki, DO, 40 mEq at 06/10/22 0831   sodium chloride flush (NS) 0.9 % injection 3 mL, 3 mL, Intravenous, Q12H, Patwardhan, Manish J, MD, 3 mL at 06/10/22 0838    sodium chloride flush (NS) 0.9 % injection 3 mL, 3 mL, Intravenous, PRN, Patwardhan, Manish J, MD, 3 mL at 06/05/22 2309   Objective:  Vital Signs in the last 24 hours: Temp:  [97.6 F (36.4 C)-98.4 F (36.9 C)] 97.7 F (36.5 C) (02/25 1518) Pulse Rate:  [78-100] 100 (02/25 1518) Resp:  [18-23] 18 (02/25 1518) BP: (102-131)/(62-74) 107/62 (02/25 1518) SpO2:  [95 %-97 %] 96 % (02/25 1518)  Intake/Output from previous day: 02/24 0701 - 02/25 0700 In: 504.7 [P.O.:180; I.V.:324.7] Out: -   Physical Exam Vitals and nursing note reviewed.  Constitutional:      General: She is not in acute distress. Neck:     Vascular: No JVD.  Cardiovascular:     Rate and Rhythm: Tachycardia present. Rhythm irregular.     Heart sounds: Normal heart sounds. No murmur heard. Pulmonary:     Effort: Pulmonary effort is normal.     Breath sounds: Transmitted upper airway sounds present. Examination of the right-lower field reveals rales. Examination of the left-lower field reveals rales. Decreased breath sounds (at bases) and rales present. No wheezing.  Skin:    Comments: Ecchymoses at the IV sites.    Imaging/tests reviewed and independently interpreted:  Cardiac MRI 06/04/2022: 1. Large complex pericardial effusion localized around LV lateral wall, measures up to 41m. There is pericardial LGE and hyperenhancement on T2 STIR imaging  consistent with pericardial inflammation   2. Normal LV size, normal wall thickness, and low normal systolic function (EF [id number]). No LGE to suggest myocardial scar   3.  Normal RV size with mild systolic dysfunction (EF [id number])    CXR 06/04/2022: COMPARISON:  Chest radiograph dated 06/03/2022   FINDINGS: Normal lung volumes. Patchy left retrocardiac opacities. No pleural effusion or pneumothorax. Similar enlarged cardiomediastinal silhouette. The visualized skeletal structures are unremarkable.   IMPRESSION: Patchy left retrocardiac opacities, which may  represent atelectasis or infection.    Cardiac Studies:  Telemetry: Afib, rate controlled, personally reviewed  EKG 06/03/2022: Atrial fibrillation w/RVR Low voltage, extremity and precordial leads Prolonged QT interval  Echocardiogram 06/04/2022: EF 55-60%. Septal flattening due to RVPO Mild MR, mod TR, mild PH Small posterior pericardial effusion  Pericardiocentesis 2/18: Ultrasound guided pericardiocentesis through apical access. 600 cc of hemorrhagic fluid drained.  Assessment & Recommendations:  88 y.o. Caucasian female with hypertension, arthritis, paroxysmal atrial fibrillation, large pericardial effusion   Large pericardial effusion Status post pericardiocentesis due to tamponade physiology  Acute pericarditis: Cardiac tamponade on 2/18, underwent pericardiocentesis w/600 cc hemorrhagic pericardial fluid drained. No infectious findings. Cytology negative for malignancy Residual large loculation of complex pericardial fluid, mostly lateral and posterior (MRI 06/04/2022) due to its location and not ideal for pericardiocentesis.  She has been evaluated by Dr. Kipp Brood from cardiothoracic surgery who recommends conservative management at this time. Due to the hemorrhagic pericardial fluid concerns with regards to high-dose ibuprofen and anticoagulation at this time. Continue aspirin 325 p.o. 3 times daily along with pantoprazole, and colchicine Due to increased bowel movement/dehydration will reduce colchicine to daily Continue incentive spirometry.  Pleural effusion: S/p Rt sided thoracentesis 2/22. Incentive spirometry encouraged. Ensure supplementation with meals. Monitor for now.  Hypokalemia: K-Dur 40 mg p.o. daily  Hyponatremia: Chronic and stable.  Monitor for now.  Discontinue Lasix as she is overall euvolemic on physical examination  Afib: Better rate controlled compared to yesterday. Continue diltiazem 120 mg bid Tolerated Lopressor 25 mg p.o. 3 times  daily with holding parameters. Will increase Lopressor to 50 mg p.o. twice daily with holding parameters Transition from IV amiodarone to oral amiodarone as of 06/10/2022 Will focus on rate control strategy. Patient is aware to be more cognizant of symptoms of TIA/CVA as she is currently not on anticoagulation given her recent hemorrhagic pericardial effusion.   Encouraged her to ambulate with PT OT to see how she is doing clinically.  Should also help evaluate what her ventricular rate is with ambulation.    Plan of care discussed with patient, husband Abigail Butts over the phone, and nursing staff.  Rex Kras, [place], [facility]  Pager: [phone] Office: [phone]

## 2022-06-11 ENCOUNTER — Inpatient Hospital Stay (HOSPITAL_COMMUNITY): Payer: Medicare Other

## 2022-06-11 DIAGNOSIS — I3139 Other pericardial effusion (noninflammatory): Secondary | ICD-10-CM

## 2022-06-11 LAB — ECHOCARDIOGRAM LIMITED
Height: 64 in
Weight: 2663.16 oz

## 2022-06-11 LAB — BASIC METABOLIC PANEL
Anion gap: 12 (ref 5–15)
BUN: 26 mg/dL — ABNORMAL HIGH (ref 8–23)
CO2: 24 mmol/L (ref 22–32)
Calcium: 9.1 mg/dL (ref 8.9–10.3)
Chloride: 96 mmol/L — ABNORMAL LOW (ref 98–111)
Creatinine, Ser: 0.9 mg/dL (ref 0.44–1.00)
GFR, Estimated: >60 mL/min (ref >60)
Glucose, Bld: 101 mg/dL — ABNORMAL HIGH (ref 70–99)
Potassium: 4.1 mmol/L (ref 3.5–5.1)
Sodium: 132 mmol/L — ABNORMAL LOW (ref 135–145)

## 2022-06-11 LAB — CBC
HCT: 33.2 % — ABNORMAL LOW (ref 36.0–46.0)
Hemoglobin: 10.9 g/dL — ABNORMAL LOW (ref 12.0–15.0)
MCH: 29.5 pg (ref 26.0–34.0)
MCHC: 32.8 g/dL (ref 30.0–36.0)
MCV: 90 fL (ref 80.0–100.0)
Platelets: 615 10*3/uL — ABNORMAL HIGH (ref 150–400)
RBC: 3.69 MIL/uL — ABNORMAL LOW (ref 3.87–5.11)
RDW: 13.9 % (ref 11.5–15.5)
WBC: 15.8 10*3/uL — ABNORMAL HIGH (ref 4.0–10.5)
nRBC: 0 % (ref 0.0–0.2)

## 2022-06-11 LAB — MAGNESIUM: Magnesium: 1.8 mg/dL (ref 1.7–2.4)

## 2022-06-11 MED ORDER — FUROSEMIDE 10 MG/ML IJ SOLN
40.0000 mg | Freq: Once | INTRAMUSCULAR | Status: AC
  Administered 2022-06-11: 40 mg via INTRAVENOUS
  Filled 2022-06-11: qty 4

## 2022-06-11 MED ORDER — SACCHAROMYCES BOULARDII 250 MG PO CAPS
250.0000 mg | ORAL_CAPSULE | Freq: Two times a day (BID) | ORAL | Status: DC
  Administered 2022-06-11: 250 mg via ORAL
  Filled 2022-06-11: qty 1

## 2022-06-11 NOTE — Progress Notes (Signed)
Subjective:  Breathing improved.  Continues to have episodes of diarrhea, not watery.    Current Facility-Administered Medications:    0.9 %  sodium chloride infusion, 250 mL, Intravenous, Continuous, Sherwood Gambler, MD, Stopped at 06/03/22 0821   0.9 %  sodium chloride infusion, 250 mL, Intravenous, PRN, Mickie Kozikowski J, MD   acetaminophen (TYLENOL) tablet 650 mg, 650 mg, Oral, Q4H PRN, Shantaya Bluestone J, MD, 650 mg at 06/11/22 0813   amiodarone (PACERONE) tablet 200 mg, 200 mg, Oral, BID, Tolia, Sunit, DO, 200 mg at 06/11/22 0813   aspirin EC tablet 325 mg, 325 mg, Oral, TID WC, Damondre Pfeifle J, MD, 325 mg at 06/11/22 1206   diltiazem (CARDIZEM SR) 12 hr capsule 120 mg, 120 mg, Oral, Q12H, Leliana Kontz J, MD, 120 mg at 06/11/22 0814   feeding supplement (ENSURE ENLIVE / ENSURE PLUS) liquid 237 mL, 237 mL, Oral, BID BM, Tolia, Sunit, DO, 237 mL at 06/11/22 1328   furosemide (LASIX) injection 40 mg, 40 mg, Intravenous, Once, Gabriell Casimir J, MD   guaiFENesin tablet 200 mg, 200 mg, Oral, Q6H PRN, Charelle Petrakis J, MD, 200 mg at 06/10/22 2156   melatonin tablet 5 mg, 5 mg, Oral, QHS PRN, Unika Nazareno J, MD, 5 mg at 06/10/22 2156   metoprolol tartrate (LOPRESSOR) tablet 50 mg, 50 mg, Oral, BID, Tolia, Sunit, DO, 50 mg at 06/11/22 0813   ondansetron (ZOFRAN) injection 4 mg, 4 mg, Intravenous, Q6H PRN, Jaxyn Rout J, MD   Oral care mouth rinse, 15 mL, Mouth Rinse, PRN, Jessey Stehlin J, MD   pantoprazole (PROTONIX) EC tablet 40 mg, 40 mg, Oral, Q0600, Evi Mccomb J, MD, 40 mg at 06/11/22 [id number]   potassium chloride (KLOR-CON) packet 40 mEq, 40 mEq, Oral, Daily, Tolia, Sunit, DO, 40 mEq at 06/11/22 0813   saccharomyces boulardii (FLORASTOR) capsule 250 mg, 250 mg, Oral, BID, Smith, Rondell A, MD, 250 mg at 06/11/22 1327   sodium chloride flush (NS) 0.9 % injection 3 mL, 3 mL, Intravenous, Q12H, Abran Gavigan J, MD, 3 mL at 06/11/22 1208    sodium chloride flush (NS) 0.9 % injection 3 mL, 3 mL, Intravenous, PRN, Varick Keys J, MD, 3 mL at 06/05/22 2309   Objective:  Vital Signs in the last 24 hours: Temp:  [97.6 F (36.4 C)-98.2 F (36.8 C)] 98.2 F (36.8 C) (02/26 0810) Pulse Rate:  [94-127] 127 (02/26 0810) Resp:  [17-20] 17 (02/26 0335) BP: (107-125)/(62-77) 125/76 (02/26 0810) SpO2:  [95 %-97 %] 95 % (02/26 0810)  Intake/Output from previous day: 02/25 0701 - 02/26 0700 In: 3 [I.V.:3] Out: 500 [Urine:500]  Physical Exam Vitals and nursing note reviewed.  Constitutional:      General: She is not in acute distress. Neck:     Vascular: No JVD.  Cardiovascular:     Rate and Rhythm: Tachycardia present. Rhythm irregular.     Heart sounds: Normal heart sounds. No murmur heard. Pulmonary:     Effort: Pulmonary effort is normal.     Breath sounds: Transmitted upper airway sounds present. Decreased breath sounds (at bases) present. No wheezing or rales.  Musculoskeletal:     Right lower leg: No edema.     Left lower leg: No edema.      Imaging/tests reviewed and independently interpreted:  Echocardiogram 06/11/2022: 1. Left ventricular ejection fraction, by estimation, is 55 to 60%. The  left ventricle has normal function. The left ventricle has no regional  wall motion abnormalities.   2.  The inferior vena cava is dilated in size with >50% respiratory  variability, suggesting right atrial pressure of 8 mmHg.   Comparison(s): Compared to previous study on 06/04/2022, IVC now dilated.  Pleural effusion better visualized.   Cardiac MRI 06/04/2022: 1. Large complex pericardial effusion localized around LV lateral wall, measures up to 76m. There is pericardial LGE and hyperenhancement on T2 STIR imaging consistent with pericardial inflammation   2. Normal LV size, normal wall thickness, and low normal systolic function (EF [id number]. No LGE to suggest myocardial scar   3.  Normal RV size with mild  systolic dysfunction (EF [id number]   CXR 06/11/2022: 1. Cardiac enlargement and pulmonary vascular congestion. 2. Suspect small bilateral pleural effusions.      Cardiac Studies:  Telemetry 06/11/2022: Afib, rate 90-110  EKG 06/03/2022: Atrial fibrillation w/RVR Low voltage, extremity and precordial leads Prolonged QT interval  Echocardiogram 06/04/2022: EF 55-60%. Septal flattening due to RVPO Mild MR, mod TR, mild PH Small posterior pericardial effusion  Pericardiocentesis 2/18: Ultrasound guided pericardiocentesis through apical access. 600 cc of hemorrhagic fluid drained.  Assessment & Recommendations:  88y.o. Caucasian female with hypertension, arthritis, paroxysmal atrial fibrillation, large pericardial effusion   Pericardial effusion: Cardiac tamponade on 2/18, underwent pericardiocentesis w/600 cc hemorrhagic pericardial fluid drained. No infectious findings. Cytology negative for malignancy Residual large loculation of complex pericardial fluid, mostly lateral and posterior (MRI 06/04/2022). This is not drainable by repeat pericardiocentesis. Discussed with Dr. LKipp Brood We decided to pursue conservative approach for now in absence of tamponade.  Echocardiogram 06/11/2022 does not show any significant pericardial effusion. Started on colchicine, but has had significant diarrhea, therefore stopped.    Pleural effusion: Bilateral pleural effusion, underwent right-sided thoracentesis 05/2020 likely transudative 650 cc. Diagnosis multiple effusions on chest x-ray) 10/04/2022. I will diurese her aggressively today with hopes of improving pleural effusion.  I do not think repeat thoracentesis would be needed unless there is a large increase in pleural effusion.  Persistent Afib: Rate fairly well-controlled on p.o. diltiazem 120 mg twice daily, metoprolol tartrate 25 mg twice daily, amiodarone 200 mg twice daily. CHA2DS2VASc score at least 4, annual stroke risk 5%. Currently  holding anticoagulation bloody pericardial effusion requiring pericardiocentesis on 06/03/2022.   Currently, treating with high-dose aspirin for pericarditis.  We should be able to stop aspirin and resume anticoagulation sometime in mid March. Patient and husband understand stroke risk, but risk of bleeding and need for high-dose aspirin.  Diarrhea:  This could be due to colchicine, but recent watery diarrhea with recent use of antibiotics raises possibility of C. difficile.  Medicine consult requested for the management.  Appreciate their input.     Discharge disposition: If diarrhea improves, pleural effusion improved, and rate is fairly well-controlled, [facility]  Appreciate input from OT/PT/case manager/social work.   MNigel Mormon MD Pager: [phone]: [phone]

## 2022-06-11 NOTE — Progress Notes (Signed)
  Echocardiogram 2D Echocardiogram has been performed.  Sheena Taylor 06/11/2022, 12:00 PM

## 2022-06-11 NOTE — Consult Note (Signed)
Initial Consultation Note   Patient: Sheena Taylor [id number] DOB: [birth year] PCP: [name], MD DOA: 06/03/2022 DOS: the patient was seen and examined on 06/11/2022 Primary service: Nigel Mormon, MD  Referring physician: Dr. Terri Skains Reason for consult: Diarrhea   Assessment and Plan:  Pericardial effusion Cardiac tamponade Idiopathic pericarditis -Per cardiology    Diarrhea Acute.  Patient reports having diarrhea with anything she eats over the last 3 days with generalized achy abdominal pain.  Patient has been started on colchicine as well as ibuprofen for symptoms.  Records note patient had received at least 2 doses of cefepime IV on admission.  Question of secondary to colchicine which have been discontinued this morning versus possibility of C. difficile given recent antibiotics. -Strict intake and output -Enteric precautions -Probiotic -Check GI panel -Discussed with Dr. Baxter Flattery of ID who recommended holding off checking for C. difficile for an additional day  TRH will continue to follow the patient.  HPI: Sheena Taylor is a 88 y.o. female with past medical history of hypertension, paroxysmal atrial fibrillation, arthritis, depression, and tobacco abuse who presented on 2/18 with near syncope as well as left-sided chest , back, and shoulder pain.  Initial labs were significant for chest x-ray noted cardiac enlargement without acute abnormality.  Labs noted WBC 13.1, lactic acid 2.9, BUN 33, and creatinine 1.26.  She was also noted to be initially hypotensive requiring Levophed for which there was concern for possibility of sepsis and patient had been started on empiric antibiotics of vancomycin, metronidazole, and cefepime.  Echocardiogram and showed moderate to large circumferential pericardial effusion likely mixed material at focal strands concerning with tamponade.  She underwent pericardiocentesis on 2/18 with 600 cc of hemorrhagic fluid was drained.   Cardiac MRI showed large complex pericardial effusion and pericardial inflammation with normal systolic function.  Patient had been noted on colchicine and high-dose ibuprofen for concern over pericarditis.  Patient had also underwent thoracentesis 2/22 which did not show any signs of she was evaluated by cardiothoracic surgery who recommended conservative management.  The patient reports having diarrhea since getting to the floor from the ICU which seems to have beenapproximately 4 days ago.  Patient reports associated symptoms of generalized achy abdominal discomfort.  Stools are liquid and occur every time she eats.  Denies seeing any blood present in her stools.  She has had poor appetite.  Cardio Past Medical History:  Diagnosis Date   Arthritis    Atrial fibrillation (HCC)    Complication of anesthesia    Dr. 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   History of laparotomy    for ectopic pregnancy   Hypertension    Macular degeneration    Pneumonia    as a child   S/P bunionectomy    bilateral and hammer toe-correction   Past Surgical History:  Procedure Laterality Date   ABDOMINAL HYSTERECTOMY     partial   BREAST EXCISIONAL BIOPSY Left 1992   BREAST EXCISIONAL BIOPSY Right 1992   BUNIONECTOMY     bilateral with hammer toe and amputation 2nd right toe with this   CATARACT EXTRACTION Left    IR THORACENTESIS ASP PLEURAL SPACE W/IMG GUIDE  06/07/2022   LAPAROTOMY  for ruptured ectopic pregnancy   PERICARDIOCENTESIS N/A 06/03/2022   Procedure: PERICARDIOCENTESIS;  Surgeon: Nigel Mormon, MD;  Location: [facility];  Service: Cardiovascular;  Laterality: N/A;   TOTAL KNEE ARTHROPLASTY Right 01/17/2015   Procedure: TOTAL RIGHT KNEE ARTHROPLASTY;  Surgeon: Paralee Cancel,  MD;  Location: [facility];  Service: Orthopedics;  Laterality: Right;   TOTAL KNEE ARTHROPLASTY Left 01/27/2019   Procedure: TOTAL KNEE ARTHROPLASTY;  Surgeon: Paralee Cancel, MD;  Location: [facility];  Service: Orthopedics;  Laterality: Left;  70 mins   VARICOSE VEIN SURGERY     bilateral   Social History:  reports that she has been smoking cigarettes. She has never used smokeless tobacco. She reports current alcohol use. She reports that she does not use drugs.  Allergies  Allergen Reactions   Codeine Nausea And Vomiting    Family History  Problem Relation Age of Onset   Heart disease Mother    Breast cancer Niece        twice    Prior to Admission medications   Medication Sig Start Date End Date Taking? Authorizing Provider  acetaminophen (TYLENOL) 500 MG tablet Take 2 tablets (1,000 mg total) by mouth every 8 (eight) hours. 01/28/19  Yes Babish, Matthew, PA-C  ELIQUIS 5 MG TABS tablet TAKE 1 TABLET BY MOUTH TWICE A DAY Patient taking differently: Take 5 mg by mouth 2 (two) times daily. 03/07/21  Yes Cantwell, Celeste C, PA-C  hydrochlorothiazide (HYDRODIURIL) 25 MG tablet Take 25 mg by mouth daily.   Yes [provider]  lisinopril (ZESTRIL) 20 MG tablet TAKE 1 TABLET BY MOUTH EVERY DAY Patient taking differently: Take 20 mg by mouth daily. 04/20/22  Yes Custovic, Collene Mares, DO  Multiple Vitamins-Minerals (MULTIVITAMIN WITH MINERALS) tablet Take 1 tablet by mouth daily.   Yes [provider]  Multiple Vitamins-Minerals (OCUVITE PRESERVISION PO) Take 1 tablet by mouth 2 (two) times daily.   Yes [provider]  naproxen sodium (ALEVE) 220 MG tablet Take 220 mg by mouth daily as needed (pain).   Yes [provider]  pravastatin (PRAVACHOL) 20 MG tablet Take 20 mg by mouth daily.   Yes [provider]  vitamin C (ASCORBIC ACID) 500 MG tablet Take 500 mg by mouth daily after breakfast.   Yes [provider]  apixaban (ELIQUIS) 5 MG TABS tablet  Take 1 tablet (5 mg total) by mouth 2 (two) times daily. Patient not taking: Reported on 06/04/2022 12/20/21   Floydene Flock, DO    Physical Exam: Vitals:   06/10/22 1921 06/10/22 2316 06/11/22 0335 06/11/22 0810  BP: 119/77 110/65 110/62 125/76  Pulse:  94  (!) 127  Resp: '20 20 17   '$ Temp: 97.8 F (36.6 C) 97.9 F (36.6 C) 97.7 F (36.5 C) 98.2 F (36.8 C)  TempSrc: Oral Oral Oral Oral  SpO2: 97% 95%  95%  Weight:      Height:       Exam  Constitutional: Elderly female who appears ill but in no acute distress at this time Eyes: PERRL, lids and conjunctivae normal ENMT: Mucous membranes are moist. Neck: normal, supple  Respiratory: Mildly tachypneic without significant wheezes appreciated.  O2 saturation currently maintained on room air. Cardiovascular: Irregular.  Trace extremity edema present. Abdomen: Generalized tenderness to palpation.  Bowel sounds appreciated in all 4 quadrants.  Data Reviewed:   Reviewed labs, imaging, and pertinent records noted above in HPI.   Family Communication: Family updated at bedside Primary team communication:  Thank you very much for involving [place] in the care of your patient.  Author: Norval Morton, MD 06/11/2022 9:46 AM  For on call review [email or web address].

## 2022-06-11 NOTE — TOC Progression Note (Signed)
Transition of Care [facility]) - Progression Note    Patient Details  Name: Sheena Taylor MRN: [id number] Date of Birth: 01-21-1935  Transition of Care [facility]) CM/SW [place], [place] Phone Number: 06/11/2022, 12:37 PM  Clinical Narrative:     CW met with patient at bedside. CSW introduced self and explained role. Patient confirmed she is aware of PT/OT recommendations for short term rehab at SNF. She states she is agreeable to rehab at SNF([facility]).   CSW updated SNF, anticipated d/c tomorrow pending insurance authorization.   TOC will continue to follow and assist with discharge planning.  Thurmond Butts, MSW, LCSW Clinical Social Worker       Expected Discharge Plan: [place] (From [place]) Barriers to Discharge: Continued Medical Work up  Expected Discharge Plan and Services                                               Social Determinants of Health (SDOH) Interventions SDOH Screenings   Food Insecurity: No Food Insecurity (06/03/2022)  Housing: Low Risk  (06/03/2022)  Transportation Needs: No Transportation Needs (06/03/2022)  Utilities: Not At Risk (06/03/2022)  Tobacco Use: High Risk (06/07/2022)    Readmission Risk Interventions     No data to display

## 2022-06-11 NOTE — Progress Notes (Signed)
Physical Therapy Treatment Patient Details Name: Sheena Taylor MRN: [id number] DOB: May 29, 1934 Today's Date: 06/11/2022   History of Present Illness 88 yo female admitted 2/18 from [facility] with CP, SOB, AFib with RVR and pericardial effusion s/p pericardiocentesis same date. PMHx; Afib, HTN, bil TKA    PT Comments    Pt received in chair, agreeable to therapy session and with good effort for transfer and gait training with RW. Pt needing up to minA to perform functional mobility tasks including household distance gait trial. Gait speed <0.4 m/s indicates increased risk of falls in community dwelling older adults. Pt would benefit from low intensity post-acute rehab prior to return to ILF to build strength/endurance as she remains below functional baseline. Pt continues to benefit from PT services to progress toward functional mobility goals.   Recommendations for follow up therapy are one component of a multi-disciplinary discharge planning process, led by the attending physician.  Recommendations may be updated based on patient status, additional functional criteria and insurance authorization.  Follow Up Recommendations  Skilled nursing-short term rehab (<3 hours/day) Can patient physically be transported by private vehicle: No   Assistance Recommended at Discharge Frequent or constant Supervision/Assistance  Patient can return home with the following A lot of help with walking and/or transfers;A little help with bathing/dressing/bathroom;Assistance with cooking/housework;Direct supervision/assist for medications management;Assist for transportation   Equipment Recommendations  Rolling walker (2 wheels);BSC/3in1    Recommendations for Other Services       Precautions / Restrictions Precautions Precautions: Fall;Other (comment) Precaution Comments: Enteric precs; watch HR Restrictions Weight Bearing Restrictions: No     Mobility  Bed Mobility Overal bed mobility:  Needs Assistance             General bed mobility comments: pt received up in chair    Transfers Overall transfer level: Needs assistance Equipment used: Rolling walker (2 wheels) Transfers: Sit to/from Stand, Bed to chair/wheelchair/BSC Sit to Stand: Min assist           General transfer comment: from chair>RW and RW>chair, pt needs safety cues not to pull up on RW.    Ambulation/Gait Ambulation/Gait assistance: Min assist Gait Distance (Feet): 100 Feet Assistive device: Rolling walker (2 wheels) Gait Pattern/deviations: Step-through pattern, Decreased stride length, Drifts right/left Gait velocity: grossly <0.4 m/s     General Gait Details: min cues for upright posture and intermittent cues for proximity to RW, RW lowered for proper height. HR to 125 with exertion, (mostly 110-116 bpm) and SpO2 WFL on RA.   Stairs             Wheelchair Mobility    Modified Rankin (Stroke Patients Only)       Balance Overall balance assessment: Needs assistance Sitting-balance support: Feet supported Sitting balance-Leahy Scale: Good     Standing balance support: Reliant on assistive device for balance, Bilateral upper extremity supported Standing balance-Leahy Scale: Fair Standing balance comment: static standing single UE support, dynamic standing needs BUE support of RW                            Cognition Arousal/Alertness: Awake/alert Behavior During Therapy: WFL for tasks assessed/performed, Flat affect Overall Cognitive Status: Within Functional Limits for tasks assessed                                 General Comments: Family x 3 present and  encouraging. Pt flat but following commands well with some increased processing time.        Exercises Other Exercises Other Exercises: IS x 5 reps (250-750 mL)    General Comments General comments (skin integrity, edema, etc.): BP stable 115/70 sitting HR 108 bpm and remained stable  standing when checked.      Pertinent Vitals/Pain Pain Assessment Pain Assessment: Faces Faces Pain Scale: Hurts little more Pain Location: legs are sensitive to pressure/palpation Pain Descriptors / Indicators: Tender, Discomfort Pain Intervention(s): Monitored during session, Repositioned (pt heels floated in recliner)     PT Goals (current goals can now be found in the care plan section) Acute Rehab PT Goals Patient Stated Goal: return to walking and my apartment after rehab PT Goal Formulation: With patient/family Time For Goal Achievement: 06/21/22 Progress towards PT goals: Progressing toward goals    Frequency    Min 3X/week      PT Plan Current plan remains appropriate       AM-PAC PT "6 Clicks" Mobility   Outcome Measure  Help needed turning from your back to your side while in a flat bed without using bedrails?: A Little Help needed moving from lying on your back to sitting on the side of a flat bed without using bedrails?: A Lot (without rails) Help needed moving to and from a bed to a chair (including a wheelchair)?: A Little Help needed standing up from a chair using your arms (e.g., wheelchair or bedside chair)?: A Little Help needed to walk in hospital room?: A Little Help needed climbing 3-5 steps with a railing? : Total 6 Click Score: 15    End of Session Equipment Utilized During Treatment: Gait belt Activity Tolerance: Patient tolerated treatment well Patient left: in chair;with call bell/phone within reach;with family/visitor present Nurse Communication: Mobility status;Other (comment) (pt needs a new purewick, hers was slightly soiled) PT Visit Diagnosis: Other abnormalities of gait and mobility (R26.89);Muscle weakness (generalized) (M62.81)     Time: [id number] PT Time Calculation (min) (ACUTE ONLY): 20 min  Charges:  $Gait Training: 8-22 mins                     Nicholette Dolson P., PTA Acute Rehabilitation Services Secure Chat Preferred  9a-5:30pm Office: [place] 06/11/2022, 2:09 PM

## 2022-06-11 NOTE — TOC Progression Note (Signed)
Transition of Care [facility]) - Progression Note    Patient Details  Name: Sheena Taylor MRN: [id number] Date of Birth: 1934/05/27  Transition [facility]) CM/SW [place], [place] Phone Number: 06/11/2022, 4:18 PM  Clinical Narrative:     Insurance authorization started & Licensed conveyancer # [id number].   TOC will continue to follow and assist with discharge planning.  Thurmond Butts, MSW, LCSW Clinical Social Worker    Expected Discharge Plan: [place] (From [place]) Barriers to Discharge: Continued Medical Work up  Expected Discharge Plan and Services                                               Social Determinants of Health (SDOH) Interventions SDOH Screenings   Food Insecurity: No Food Insecurity (06/03/2022)  Housing: Low Risk  (06/03/2022)  Transportation Needs: No Transportation Needs (06/03/2022)  Utilities: Not At Risk (06/03/2022)  Tobacco Use: High Risk (06/07/2022)    Readmission Risk Interventions     No data to display

## 2022-06-11 NOTE — Progress Notes (Signed)
Occupational Therapy Treatment Patient Details Name: Sheena Taylor MRN: [id number] DOB: Oct 29, 1934 Today's Date: 06/11/2022   History of present illness 88 yo female admitted 2/18 from [facility] with CP, SOB, AFib with RVR and pericardial effusion s/p pericardiocentesis same date. PMHx; Afib, HTN, bil TKA   OT comments  Patient with chest x-ray and c-diff stool ordered.  Subjectively feels fatigued and tired, states she has not been eating as much.  Patient still moving very close to her eval status, needing Min A for bed mobility and sit to stand.  Patient deferring out of bed to the recliner for now.  Continues to need Mod A for lower body ADL.  OT continues to be indicated in the acute setting to address deficits listed, snd assist with transition to SNF for short term rehab.  Patient does not have the needed up to Mod A to transition directly home.     Recommendations for follow up therapy are one component of a multi-disciplinary discharge planning process, led by the attending physician.  Recommendations may be updated based on patient status, additional functional criteria and insurance authorization.    Follow Up Recommendations  Skilled nursing-short term rehab (<3 hours/day)     Assistance Recommended at Discharge Frequent or constant Supervision/Assistance  Patient can return home with the following  Assist for transportation;Assistance with cooking/housework;A lot of help with bathing/dressing/bathroom;A lot of help with walking and/or transfers   Equipment Recommendations  None recommended by OT    Recommendations for Other Services      Precautions / Restrictions Precautions Precautions: Fall;Other (comment) Precaution Comments: watch HR Restrictions Weight Bearing Restrictions: No       Mobility Bed Mobility Overal bed mobility: Needs Assistance Bed Mobility: Sidelying to Sit, Sit to Sidelying   Sidelying to sit: Min assist, Mod assist     Sit to  sidelying: Min assist      Transfers Overall transfer level: Needs assistance Equipment used: Rolling walker (2 wheels) Transfers: Sit to/from Stand, Bed to chair/wheelchair/BSC Sit to Stand: Min assist                 Balance Overall balance assessment: Needs assistance Sitting-balance support: Feet supported Sitting balance-Leahy Scale: Good     Standing balance support: Reliant on assistive device for balance Standing balance-Leahy Scale: Poor                             ADL either performed or assessed with clinical judgement   ADL       Grooming: Wash/dry hands;Wash/dry face;Set up;Sitting   Upper Body Bathing: Minimal assistance;Sitting   Lower Body Bathing: Moderate assistance;Sit to/from stand   Upper Body Dressing : Minimal assistance;Sitting   Lower Body Dressing: Moderate assistance;Sit to/from stand   Toilet Transfer: Minimal assistance;Stand-pivot;Rolling walker (2 wheels);BSC/3in1                  Extremity/Trunk Assessment Upper Extremity Assessment Upper Extremity Assessment: Generalized weakness   Lower Extremity Assessment Lower Extremity Assessment: Defer to PT evaluation   Cervical / Trunk Assessment Cervical / Trunk Assessment: Kyphotic    Vision Patient Visual Report: No change from baseline     Perception Perception Perception: Not tested   Praxis Praxis Praxis: Not tested    Cognition Arousal/Alertness: Lethargic Behavior During Therapy: WFL for tasks assessed/performed, Flat affect Overall Cognitive Status: Within Functional Limits for tasks assessed  Exercises      Shoulder Instructions       General Comments  HR to 111 with activity    Pertinent Vitals/ Pain       Pain Assessment Pain Assessment: Faces Faces Pain Scale: Hurts little more Pain Location: legs are sensitive when assisting with bed mobility Pain Descriptors /  Indicators: Tender Pain Intervention(s): Monitored during session                                                          Frequency  Min 2X/week        Progress Toward Goals  OT Goals(current goals can now be found in the care plan section)  Progress towards OT goals: Progressing toward goals  Acute Rehab OT Goals OT Goal Formulation: With patient Time For Goal Achievement: 06/21/22 Potential to Achieve Goals: Good  Plan Discharge plan remains appropriate    Co-evaluation                 AM-PAC OT "6 Clicks" Daily Activity     Outcome Measure   Help from another person eating meals?: None Help from another person taking care of personal grooming?: A Little Help from another person toileting, which includes using toliet, bedpan, or urinal?: A Little Help from another person bathing (including washing, rinsing, drying)?: A Lot Help from another person to put on and taking off regular upper body clothing?: A Little Help from another person to put on and taking off regular lower body clothing?: A Lot 6 Click Score: 17    End of Session Equipment Utilized During Treatment: Gait belt;Rolling walker (2 wheels)  OT Visit Diagnosis: Unsteadiness on feet (R26.81);Muscle weakness (generalized) (M62.81)   Activity Tolerance Patient tolerated treatment well   Patient Left in bed;with call bell/phone within reach   Nurse Communication Mobility status        Time: 1018-1040 OT Time Calculation (min): 22 min  Charges: OT General Charges $OT Visit: 1 Visit OT Treatments $Self Care/Home Management : 8-22 mins  06/11/2022  RP, OTR/L  Acute Rehabilitation Services  Office:  [phone]   Metta Clines 06/11/2022, 10:46 AM

## 2022-06-12 ENCOUNTER — Other Ambulatory Visit (HOSPITAL_COMMUNITY): Payer: Self-pay

## 2022-06-12 ENCOUNTER — Inpatient Hospital Stay (HOSPITAL_COMMUNITY): Payer: Medicare Other

## 2022-06-12 ENCOUNTER — Other Ambulatory Visit: Payer: Self-pay | Admitting: Cardiology

## 2022-06-12 DIAGNOSIS — R197 Diarrhea, unspecified: Secondary | ICD-10-CM

## 2022-06-12 LAB — GASTROINTESTINAL PANEL BY PCR, STOOL (REPLACES STOOL CULTURE)

## 2022-06-12 LAB — BASIC METABOLIC PANEL
Anion gap: 14 (ref 5–15)
BUN: 24 mg/dL — ABNORMAL HIGH (ref 8–23)
CO2: 23 mmol/L (ref 22–32)
Calcium: 8.7 mg/dL — ABNORMAL LOW (ref 8.9–10.3)
Chloride: 98 mmol/L (ref 98–111)
Creatinine, Ser: 0.79 mg/dL (ref 0.44–1.00)
GFR, Estimated: >60 mL/min (ref >60)
Glucose, Bld: 98 mg/dL (ref 70–99)
Potassium: 3.4 mmol/L — ABNORMAL LOW (ref 3.5–5.1)
Sodium: 135 mmol/L (ref 135–145)

## 2022-06-12 LAB — CULTURE, BODY FLUID W GRAM STAIN -BOTTLE: Culture: NO GROWTH

## 2022-06-12 LAB — MAGNESIUM: Magnesium: 1.6 mg/dL — ABNORMAL LOW (ref 1.7–2.4)

## 2022-06-12 MED ORDER — PANTOPRAZOLE SODIUM 40 MG PO TBEC
40.0000 mg | DELAYED_RELEASE_TABLET | Freq: Every day | ORAL | 1 refills | Status: DC
  Filled 2022-06-12: qty 30, 30d supply, fill #0

## 2022-06-12 MED ORDER — MAGNESIUM SULFATE 4 GM/100ML IV SOLN
4.0000 g | Freq: Once | INTRAVENOUS | Status: AC
  Administered 2022-06-12: 4 g via INTRAVENOUS
  Filled 2022-06-12: qty 100

## 2022-06-12 MED ORDER — LOPERAMIDE HCL 2 MG PO CAPS: 2.0000 mg | ORAL_CAPSULE | Freq: Three times a day (TID) | ORAL | Status: DC | PRN

## 2022-06-12 MED ORDER — DILTIAZEM HCL ER 120 MG PO CP12
120.0000 mg | ORAL_CAPSULE | Freq: Two times a day (BID) | ORAL | 1 refills | Status: DC
  Filled 2022-06-12: qty 60, 30d supply, fill #0

## 2022-06-12 MED ORDER — LOPERAMIDE HCL 2 MG PO CAPS
2.0000 mg | ORAL_CAPSULE | Freq: Three times a day (TID) | ORAL | 1 refills | Status: DC | PRN
  Filled 2022-06-12: qty 30, 10d supply, fill #0

## 2022-06-12 MED ORDER — AMIODARONE HCL 200 MG PO TABS
200.0000 mg | ORAL_TABLET | Freq: Every day | ORAL | 1 refills | Status: DC
  Filled 2022-06-12: qty 30, 30d supply, fill #0

## 2022-06-12 MED ORDER — APIXABAN 5 MG PO TABS
5.0000 mg | ORAL_TABLET | Freq: Two times a day (BID) | ORAL | Status: DC
  Administered 2022-06-12: 5 mg via ORAL
  Filled 2022-06-12: qty 1

## 2022-06-12 MED ORDER — METOPROLOL TARTRATE 50 MG PO TABS
50.0000 mg | ORAL_TABLET | Freq: Two times a day (BID) | ORAL | 1 refills | Status: DC
  Filled 2022-06-12: qty 60, 30d supply, fill #0

## 2022-06-12 NOTE — Progress Notes (Signed)
Consult Progress Note    Sheena Taylor   [id number]  DOB: [birth year]  DOA: 06/03/2022     9 PCP: [name], MD   Interval History:  No events overnight.  Patient endorsing improvement in diarrhea further compared to yesterday.  Starting to have some form this morning.  Denies any abdominal pain.  Remains afebrile.  Assessment and Plan:  Diarrhea -Patient on colchicine for 7 days.  Discontinued on 06/10/2022.  She developed diarrhea warranting discontinuation -Doubt antibiotics contributed as she was only on cefepime on 2/18 and 2/19 -GI panel pending although treatment would be supportive care - No need for pursuing C. difficile testing given improvement in stools after discontinuation of colchicine and has benign abdomen and remains afebrile    Pericardial effusion Cardiac tamponade Idiopathic pericarditis -Per cardiology     TRH will continue to follow the patient.   DVT prophylaxis:  SCD's Start: 06/03/22 1526 apixaban (ELIQUIS) tablet 5 mg   Code Status:   Code Status: Full Code  Mobility Assessment (last 72 hours)     Mobility Assessment     Row Name 06/12/22 1028 06/11/22 2005 06/11/22 1300 06/11/22 1045 06/10/22 2000   Does patient have an order for bedrest or is patient medically unstable No - Continue assessment No - Continue assessment -- -- No - Continue assessment   What is the highest level of mobility based on the progressive mobility assessment? Level 5 (Walks with assist in room/hall) - Balance while stepping forward/back and can walk in room with assist - Complete Level 5 (Walks with assist in room/hall) - Balance while stepping forward/back and can walk in room with assist - Complete Level 5 (Walks with assist in room/hall) - Balance while stepping forward/back and can walk in room with assist - Complete Level 4 (Walks with assist in room) - Balance while marching in place and cannot step forward and back - Complete Level 4 (Walks with assist in  room) - Balance while marching in place and cannot step forward and back - Complete   Is the above level different from baseline mobility prior to current illness? Yes - Recommend PT order Yes - Recommend PT order -- -- Yes - Recommend PT order    [place] Name 06/09/22 2000           Does patient have an order for bedrest or is patient medically unstable No - Continue assessment       What is the highest level of mobility based on the progressive mobility assessment? Level 4 (Walks with assist in room) - Balance while marching in place and cannot step forward and back - Complete       Is the above level different from baseline mobility prior to current illness? Yes - Recommend PT order                Objective: Blood pressure (!) 115/59, pulse 93, temperature 97.7 F (36.5 C), temperature source Oral, resp. rate (!) 24, height '5\' 4"'$  (1.626 m), weight 75.5 kg, SpO2 100 %.  Examination:  Physical Exam Constitutional:      General: She is not in acute distress.    Appearance: Normal appearance. She is not ill-appearing.  HENT:     Head: Normocephalic and atraumatic.     Mouth/Throat:     Mouth: Mucous membranes are moist.  Eyes:     Extraocular Movements: Extraocular movements intact.  Cardiovascular:     Rate and Rhythm: Normal rate and regular rhythm.  Pulmonary:     Effort: Pulmonary effort is normal. No respiratory distress.     Breath sounds: Normal breath sounds. No wheezing.  Abdominal:     General: Bowel sounds are normal. There is no distension.     Palpations: Abdomen is soft.     Tenderness: There is no abdominal tenderness.  Musculoskeletal:        General: Normal range of motion.     Cervical back: Normal range of motion and neck supple.  Skin:    General: Skin is warm and dry.  Neurological:     General: No focal deficit present.     Mental Status: She is alert.  Psychiatric:        Mood and Affect: Mood normal.        Data Reviewed: Results for orders  placed or performed during the hospital encounter of 06/03/22 (from the past 24 hour(s))  Magnesium     Status: Abnormal   Collection Time: 06/12/22  5:50 AM  Result Value Ref Range   Magnesium 1.6 (L) 1.7 - 2.4 mg/dL  Basic metabolic panel     Status: Abnormal   Collection Time: 06/12/22  5:50 AM  Result Value Ref Range   Sodium 135 135 - 145 mmol/L   Potassium 3.4 (L) 3.5 - 5.1 mmol/L   Chloride 98 98 - 111 mmol/L   CO2 23 22 - 32 mmol/L   Glucose, Bld 98 70 - 99 mg/dL   BUN 24 (H) 8 - 23 mg/dL   Creatinine, Ser 0.79 0.44 - 1.00 mg/dL   Calcium 8.7 (L) 8.9 - 10.3 mg/dL   GFR, Estimated >60 >60 mL/min   Anion gap 14 5 - 15    I have reviewed pertinent nursing notes, vitals, labs, and images as necessary. I have ordered labwork to follow up on as indicated.  I have reviewed the last notes from staff over past 24 hours. I have discussed patient's care plan and test results with nursing staff, CM/SW, and other staff as appropriate.    LOS: 9 days   Dwyane Dee, MD [facility] 06/12/2022, 2:21 PM

## 2022-06-12 NOTE — NC FL2 (Signed)
[place] LEVEL OF CARE FORM     IDENTIFICATION  Patient Name: Sheena Taylor Birthdate: 06/05/1934 Sex: female Admission Date (Current Location): 06/03/2022  [facility] and [place] Number:  Herbalist and Address:  [facility], [place] [address], [place], [place] [address]      Provider Number: [id number]  Attending Physician Name and Address:  Nigel Mormon, MD  Relative Name and Phone Number:       Current Level of Care: Hospital Recommended Level of Care: [place] Prior Approval Number:    Date Approved/Denied:   PASRR Number: [id number]  Discharge Plan: SNF    Current Diagnoses: Patient Active Problem List   Diagnosis Date Noted   Acute idiopathic pericarditis 06/09/2022   Pleural effusion 06/09/2022   Hypokalemia 06/09/2022   Hyponatremia 06/09/2022   Atrial fibrillation with controlled ventricular rate ([place]) 06/09/2022   Pericardial effusion 06/03/2022   Cardiac tamponade 06/03/2022   Atypical chest pain 10/01/2019   S/P left TKA 01/27/2019   Preop cardiovascular exam 12/16/2018   Paroxysmal atrial fibrillation ([place]) 12/16/2018   Murmur 12/16/2018   Essential hypertension 12/16/2018   Obese 01/19/2015   S/P right TKA 01/17/2015    Orientation RESPIRATION BLADDER Height & Weight     Self, Time, Situation, Place  Normal Continent Weight: 166 lb 7.2 oz (75.5 kg) Height:  '5\' 4"'$  (162.6 cm)  BEHAVIORAL SYMPTOMS/MOOD NEUROLOGICAL BOWEL NUTRITION STATUS      Continent Diet (please see discharge summary)  AMBULATORY STATUS COMMUNICATION OF NEEDS Skin   Limited Assist Verbally Other (Comment) (Ecchymosis - arms & buttocks)                       Personal Care Assistance Level of Assistance  Bathing, Feeding, Dressing Bathing Assistance: Limited assistance Feeding assistance: Independent Dressing Assistance: Limited assistance     Functional Limitations Info  Sight, Hearing,  Speech Sight Info: Impaired Hearing Info: Adequate Speech Info: Adequate    SPECIAL CARE FACTORS FREQUENCY                       Contractures Contractures Info: Not present    Additional Factors Info                  Current Medications (06/12/2022):  This is the current hospital active medication list Current Facility-Administered Medications  Medication Dose Route Frequency Provider Last Rate Last Admin   0.9 %  sodium chloride infusion  250 mL Intravenous Continuous Sherwood Gambler, MD   Stopped at 06/03/22 0821   0.9 %  sodium chloride infusion  250 mL Intravenous PRN Patwardhan, Manish J, MD       acetaminophen (TYLENOL) tablet 650 mg  650 mg Oral Q4H PRN Patwardhan, Manish J, MD   650 mg at 06/11/22 0813   amiodarone (PACERONE) tablet 200 mg  200 mg Oral BID Tolia, Sunit, DO   200 mg at 06/12/22 0843   apixaban (ELIQUIS) tablet 5 mg  5 mg Oral BID Patwardhan, Manish J, MD   5 mg at 06/12/22 1144   aspirin EC tablet 325 mg  325 mg Oral TID WC Patwardhan, Manish J, MD   325 mg at 06/12/22 1144   diltiazem (CARDIZEM SR) 12 hr capsule 120 mg  120 mg Oral Q12H Patwardhan, Manish J, MD   120 mg at 06/12/22 0843   feeding supplement (ENSURE ENLIVE / ENSURE PLUS) liquid 237  mL  237 mL Oral BID BM Tolia, Sunit, DO   237 mL at 06/11/22 1328   guaiFENesin tablet 200 mg  200 mg Oral Q6H PRN Patwardhan, Manish J, MD   200 mg at 06/11/22 2136   loperamide (IMODIUM) capsule 2 mg  2 mg Oral Q8H PRN Patwardhan, Manish J, MD       magnesium sulfate IVPB 4 g 100 mL  4 g Intravenous Once Patwardhan, Manish J, MD 50 mL/hr at 06/12/22 1155 4 g at 06/12/22 1155   melatonin tablet 5 mg  5 mg Oral QHS PRN Patwardhan, Manish J, MD   5 mg at 06/11/22 2136   metoprolol tartrate (LOPRESSOR) tablet 50 mg  50 mg Oral BID Tolia, Sunit, DO   50 mg at 06/12/22 0843   ondansetron (ZOFRAN) injection 4 mg  4 mg Intravenous Q6H PRN Patwardhan, Manish J, MD       Oral care mouth rinse  15 mL Mouth Rinse  PRN Patwardhan, Manish J, MD       pantoprazole (PROTONIX) EC tablet 40 mg  40 mg Oral Q0600 Patwardhan, Manish J, MD   40 mg at 06/12/22 0613   potassium chloride (KLOR-CON) packet 40 mEq  40 mEq Oral Daily Tolia, Sunit, DO   40 mEq at 06/12/22 0842   sodium chloride flush (NS) 0.9 % injection 3 mL  3 mL Intravenous Q12H Patwardhan, Manish J, MD   3 mL at 06/12/22 1022   sodium chloride flush (NS) 0.9 % injection 3 mL  3 mL Intravenous PRN Patwardhan, Reynold Bowen, MD   3 mL at 06/05/22 2309     Discharge Medications: Please see discharge summary for a list of discharge medications.  Relevant Imaging Results:  Relevant Lab Results:   Additional Information SSN [id number]  Vinie Sill, LCSW

## 2022-06-12 NOTE — Discharge Summary (Addendum)
Physician Discharge Summary  Patient ID: Sheena Taylor MRN: [id number] DOB/AGE: 86-Aug-1936 86 y.o.  Admit date: 06/03/2022 Discharge date: 06/12/2022  Primary Discharge Diagnosis: Cardiac tamponade, resolved Pericardial effusion, resolved Pleural effusion, improved Diarrhea, resolved Persistent atrial fibrillation  Secondary Discharge Diagnosis: Hypertension Arthritis   Hospital Course:   88 y.o. Caucasian female with hypertension, arthritis, paroxysmal atrial fibrillation, recent cough and chest pain symptoms, admitted with worsening shortness of breath on 06/03/2022.  She was found to be in cardiac tamponade for which she underwent emergency pericardiocentesis with 600 cc of hemorrhagic pericardial fluid drawn.  Fortunately, cytology was negative for malignant cells.  Pericardial inflammation was noted on subsequent MRI.  It was felt that her presentation was due to pericarditis leading to pericardial effusion, with bloody effusion present in the setting of ongoing use of Eliquis.  Eliquis was held.  Over the next couple of days, A-fib rate improved.  However, she was still having shortness of breath with shallow breathing and pleuritic chest pain.  She was started on colchicine.  Subsequently, she was also started on ibuprofen and then aspirin for scheduled treatment of pleuritic pain from pericarditis.  Bilateral pleural effusions were noted on chest x-ray.  She underwent 650 cc transudative thoracentesis on the right side with improvement in shortness of breath.  However, she subsequently developed diarrhea in the setting of colchicine use.  This improved after stopping colchicine.  C. difficile was not checked as suspicion was low.  A-fib rate controlled eventually improved with diltiazem, metoprolol, and amiodarone.  She required IV diltiazem followed by IV amiodarone, and then subsequently switched to oral medications.  On the day of discharge, patient's rate is controlled.   Diarrhea is improving.  Shortness of breath is improved with diuresis.  Given that patient's oral intake and appetite is reduced, I am concerned that high-dose aspirin will cause her GI side effects.  Therefore, I have stopped aspirin.  Her pain has improved at this time.  I do not think we will need to use any more scheduled NSAIDs medication.  Therefore, I will restart Eliquis on discharge at this time.    She will be [facility] to [facility] nursing facility, as recommended by PT/OT.  Follow-up for transition of care arranged in our office on 06/25/2022.     Discharge Exam: Blood pressure 130/77, pulse (!) 104, temperature 97.8 F (36.6 C), temperature source Oral, resp. rate 20, height '5\' 4"'$  (1.626 m), weight 75.5 kg, SpO2 98 %.   Physical Exam Vitals and nursing note reviewed.  Constitutional:      General: She is not in acute distress. Neck:     Vascular: No JVD.  Cardiovascular:     Rate and Rhythm: Tachycardia present. Rhythm irregular.     Heart sounds: Normal heart sounds. No murmur heard. Pulmonary:     Effort: Pulmonary effort is normal.     Breath sounds: Normal breath sounds. No wheezing or rales.  Musculoskeletal:     Right lower leg: No edema.     Left lower leg: No edema.       Significant Diagnostic Studies:  EKG 06/08/2022: Atrial fibrillation with rapid ventricular response Nonspecific T wave abnormality Abnormal ECG  Echocardiogram 06/11/2022: 1. Left ventricular ejection fraction, by estimation, is 55 to 60%. The  left ventricle has normal function. The left ventricle has no regional  wall motion abnormalities.   2. The inferior vena cava is dilated in size with >50% respiratory  variability, suggesting right atrial pressure of  8 mmHg.   Comparison(s): Compared to previous study on 06/04/2022, IVC now dilated.  Pleural effusion better visualized.    Cardiac MRI 06/04/2022: 1. Large complex pericardial effusion localized around LV  lateral wall, measures up to 67m. There is pericardial LGE and hyperenhancement on T2 STIR imaging consistent with pericardial inflammation   2. Normal LV size, normal wall thickness, and low normal systolic function (EF [id number]. No LGE to suggest myocardial scar   3.  Normal RV size with mild systolic dysfunction (EF [id number]   CXR 06/12/2022: Similar trace bilateral pleural effusions and bibasilar patchy opacities, likely atelectasis.   Cardiac Studies:   Telemetry 06/11/2022: Afib, rate 90-110   EKG 06/03/2022: Atrial fibrillation w/RVR Low voltage, extremity and precordial leads Prolonged QT interval   Echocardiogram 06/04/2022: EF 55-60%. Septal flattening due to RVPO Mild MR, mod TR, mild PH Small posterior pericardial effusion   Pericardiocentesis 2/18: Ultrasound guided pericardiocentesis through apical access. 600 cc of hemorrhagic fluid drained.     FOLLOW UP PLANS AND APPOINTMENTS Discharge Instructions     Diet - low sodium heart healthy   Complete by: As directed    Increase activity slowly   Complete by: As directed       Allergies as of 06/12/2022       Reactions   Codeine Nausea And Vomiting        Medication List     STOP taking these medications    hydrochlorothiazide 25 MG tablet Commonly known as: HYDRODIURIL   lisinopril 20 MG tablet Commonly known as: ZESTRIL   naproxen sodium 220 MG tablet Commonly known as: ALEVE       TAKE these medications    acetaminophen 500 MG tablet Commonly known as: TYLENOL Take 2 tablets (1,000 mg total) by mouth every 8 (eight) hours.   amiodarone 200 MG tablet Commonly known as: PACERONE Take 1 tablet (200 mg total) by mouth daily.   ascorbic acid 500 MG tablet Commonly known as: VITAMIN C Take 500 mg by mouth daily after breakfast.   diltiazem 120 MG 12 hr capsule Commonly known as: CARDIZEM SR Take 1 capsule (120 mg total) by mouth every 12 (twelve) hours.   Eliquis 5 MG Tabs  tablet Generic drug: apixaban TAKE 1 TABLET BY MOUTH TWICE A DAY What changed:  how much to take Another medication with the same name was removed. Continue taking this medication, and follow the directions you see here.   loperamide 2 MG capsule Commonly known as: IMODIUM Take 1 capsule (2 mg total) by mouth every 8 (eight) hours as needed for diarrhea or loose stools.   metoprolol tartrate 50 MG tablet Commonly known as: LOPRESSOR Take 1 tablet (50 mg total) by mouth 2 (two) times daily.   OCUVITE PRESERVISION PO Take 1 tablet by mouth 2 (two) times daily.   multivitamin with minerals tablet Take 1 tablet by mouth daily.   pantoprazole 40 MG tablet Commonly known as: PROTONIX Take 1 tablet (40 mg total) by mouth daily at 6 (six) AM.   pravastatin 20 MG tablet Commonly known as: PRAVACHOL Take 20 mg by mouth daily.       Time spent: 45 min    MNigel Mormon MD Pager: [phone]: [phone]

## 2022-06-12 NOTE — Care Management Important Message (Signed)
Important Message  Patient Details  Name: Sheena Taylor MRN: [id number] Date of Birth: July 30, 1934   Medicare Important Message Given:  Yes     Shelda Altes 06/12/2022, 10:43 AM

## 2022-06-12 NOTE — TOC Progression Note (Signed)
Transition of Care [facility]) - Progression Note    Patient Details  Name: Sheena Taylor MRN: [id number] Date of Birth: [birth year]  Transition of Care [facility]) CM/SW [place], [place] Phone Number: 06/12/2022, 8:40 AM  Clinical Narrative:     Received insurance authorization for SNF - [id number]   Reference # [id number] 2/26-2/28  Expected Discharge Plan: [place] (From [place]) Barriers to Discharge: Continued Medical Work up  Expected Discharge Plan and Services                                               Social Determinants of Health (SDOH) Interventions SDOH Screenings   Food Insecurity: No Food Insecurity (06/03/2022)  Housing: Low Risk  (06/03/2022)  Transportation Needs: No Transportation Needs (06/03/2022)  Utilities: Not At Risk (06/03/2022)  Tobacco Use: High Risk (06/07/2022)    Readmission Risk Interventions     No data to display

## 2022-06-12 NOTE — Progress Notes (Signed)
Mobility Specialist Progress Note   06/12/22 1100  Mobility  Activity Transferred to/from [facility];Transferred from bed to chair  Level of Assistance Minimal assist, patient does 75% or more  Assistive Device Front wheel walker  Distance Ambulated (ft) 3 ft  Range of Motion/Exercises Active;All extremities  Activity Response Tolerated well   Patient received on [facility] requesting assistance to recliner. Deferred ambulation for unspecified reasons. Required min A + verbal cues for hand placement to stand. Upon standing needed TA for pericare. Patient then took steps to recliner chair at min guard level of assist. Tolerated without complaint or incident. Was left in recliner with all needs met, call bell in reach.   Sheena Taylor, BS EXP Mobility Specialist Please contact via SecureChat or Rehab office at [phone]

## 2022-06-12 NOTE — TOC Transition Note (Addendum)
Transition of Care [facility]) - CM/SW Discharge Note   Patient Details  Name: Sheena Taylor MRN: [id number] Date of Birth: [birth year]  Transition of Care [facility]) CM/SW Contact:  Vinie Sill, LCSW Phone Number: 06/12/2022, 2:03 PM   Clinical Narrative:     Patient will Discharge to: [place] Discharge Date: 06/12/22 Family Notified: spouse Transport By: Corey Harold  Per MD patient is ready for discharge. RN, patient, and facility notified of discharge. Discharge Summary sent to facility. RN given number for [phone]. Please advise them of updated d/c summary sent . Ambulance transport requested for patient.   Clinical Social Worker signing off.  Thurmond Butts, MSW, LCSW Clinical Social Worker      Final next level of care: Skilled Nursing Facility Barriers to Discharge: Barriers Resolved   Patient Goals and CMS Choice      Discharge Placement                Patient chooses bed at: [facility] Patient to be transferred to facility by: [facility] Name of family member notified: spouse Patient and family notified of of transfer: 06/12/22  Discharge Plan and Services Additional resources added to the After Visit Summary for                                       Social Determinants of Health (SDOH) Interventions SDOH Screenings   Food Insecurity: No Food Insecurity (06/03/2022)  Housing: Low Risk  (06/03/2022)  Transportation Needs: No Transportation Needs (06/03/2022)  Utilities: Not At Risk (06/03/2022)  Tobacco Use: High Risk (06/07/2022)     Readmission Risk Interventions     No data to display

## 2022-06-14 DIAGNOSIS — M1990 Unspecified osteoarthritis, unspecified site: Secondary | ICD-10-CM

## 2022-06-14 DIAGNOSIS — I4819 Other persistent atrial fibrillation: Secondary | ICD-10-CM

## 2022-06-14 DIAGNOSIS — I10 Essential (primary) hypertension: Secondary | ICD-10-CM

## 2022-06-19 ENCOUNTER — Telehealth: Payer: Self-pay

## 2022-06-19 NOTE — Telephone Encounter (Signed)
Called patient regarding her recent visit in the hospital, NA, LMAM.

## 2022-06-20 ENCOUNTER — Telehealth: Payer: Self-pay

## 2022-06-20 ENCOUNTER — Ambulatory Visit: Payer: Medicare Other | Admitting: Internal Medicine

## 2022-06-20 NOTE — Telephone Encounter (Signed)
Location of hospitalization: [place] Reason for hospitalization: Syncope and difficulty breathing Date of discharge: 06/12/2022 Date of first communication with patient: today Person contacting patient: Me Current symptoms: none Do you understand why you were in the Hospital: Yes Questions regarding discharge instructions: None Where were you discharged to: Home Medications reviewed: Yes Allergies reviewed: Yes Dietary changes reviewed: Yes. Discussed low fat and low salt diet.  Referals reviewed: NA Activities of Daily Living: Able to with mild limitations Any transportation issues/concerns: None Any patient concerns: None Confirmed importance & date/time of Follow up appt: Yes Confirmed with patient if condition begins to worsen call. Pt was given the office number and encouraged to call back with questions or concerns: Yes

## 2022-06-20 NOTE — Telephone Encounter (Signed)
TOC Done and in chart

## 2022-06-25 ENCOUNTER — Ambulatory Visit: Payer: Medicare Other | Admitting: Cardiology

## 2022-06-25 ENCOUNTER — Emergency Department (HOSPITAL_COMMUNITY): Payer: Medicare Other

## 2022-06-25 ENCOUNTER — Other Ambulatory Visit: Payer: Self-pay

## 2022-06-25 ENCOUNTER — Encounter (HOSPITAL_COMMUNITY): Payer: Self-pay | Admitting: *Deleted

## 2022-06-25 ENCOUNTER — Inpatient Hospital Stay (HOSPITAL_COMMUNITY)
Admission: EM | Admit: 2022-06-25 | Discharge: 2022-06-29 | DRG: 291 | Disposition: A | Discharge status: Skilled Nursing Facility | Payer: Medicare Other | Source: Ambulatory Visit | Attending: Family Medicine | Admitting: Family Medicine

## 2022-06-25 DIAGNOSIS — I4819 Other persistent atrial fibrillation: Secondary | ICD-10-CM | POA: Diagnosis present

## 2022-06-25 DIAGNOSIS — I4891 Unspecified atrial fibrillation: Secondary | ICD-10-CM

## 2022-06-25 DIAGNOSIS — I110 Hypertensive heart disease with heart failure: Principal | ICD-10-CM | POA: Diagnosis present

## 2022-06-25 DIAGNOSIS — I3139 Other pericardial effusion (noninflammatory): Secondary | ICD-10-CM | POA: Diagnosis present

## 2022-06-25 DIAGNOSIS — F17210 Nicotine dependence, cigarettes, uncomplicated: Secondary | ICD-10-CM | POA: Diagnosis present

## 2022-06-25 DIAGNOSIS — I959 Hypotension, unspecified: Secondary | ICD-10-CM | POA: Diagnosis not present

## 2022-06-25 DIAGNOSIS — J9811 Atelectasis: Secondary | ICD-10-CM | POA: Diagnosis present

## 2022-06-25 DIAGNOSIS — I5030 Unspecified diastolic (congestive) heart failure: Secondary | ICD-10-CM | POA: Diagnosis present

## 2022-06-25 DIAGNOSIS — R531 Weakness: Secondary | ICD-10-CM | POA: Diagnosis not present

## 2022-06-25 DIAGNOSIS — I4811 Longstanding persistent atrial fibrillation: Secondary | ICD-10-CM

## 2022-06-25 DIAGNOSIS — Z8249 Family history of ischemic heart disease and other diseases of the circulatory system: Secondary | ICD-10-CM

## 2022-06-25 DIAGNOSIS — R0609 Other forms of dyspnea: Secondary | ICD-10-CM | POA: Diagnosis not present

## 2022-06-25 DIAGNOSIS — Z79899 Other long term (current) drug therapy: Secondary | ICD-10-CM

## 2022-06-25 DIAGNOSIS — I5033 Acute on chronic diastolic (congestive) heart failure: Secondary | ICD-10-CM | POA: Insufficient documentation

## 2022-06-25 DIAGNOSIS — R54 Age-related physical debility: Secondary | ICD-10-CM | POA: Diagnosis present

## 2022-06-25 DIAGNOSIS — J90 Pleural effusion, not elsewhere classified: Secondary | ICD-10-CM | POA: Diagnosis not present

## 2022-06-25 DIAGNOSIS — I444 Left anterior fascicular block: Secondary | ICD-10-CM | POA: Diagnosis present

## 2022-06-25 DIAGNOSIS — I951 Orthostatic hypotension: Secondary | ICD-10-CM | POA: Diagnosis present

## 2022-06-25 DIAGNOSIS — E871 Hypo-osmolality and hyponatremia: Secondary | ICD-10-CM | POA: Diagnosis present

## 2022-06-25 DIAGNOSIS — Z7901 Long term (current) use of anticoagulants: Secondary | ICD-10-CM

## 2022-06-25 DIAGNOSIS — Z803 Family history of malignant neoplasm of breast: Secondary | ICD-10-CM

## 2022-06-25 DIAGNOSIS — Z96653 Presence of artificial knee joint, bilateral: Secondary | ICD-10-CM | POA: Diagnosis present

## 2022-06-25 DIAGNOSIS — D72829 Elevated white blood cell count, unspecified: Secondary | ICD-10-CM | POA: Diagnosis present

## 2022-06-25 DIAGNOSIS — M1990 Unspecified osteoarthritis, unspecified site: Secondary | ICD-10-CM | POA: Diagnosis present

## 2022-06-25 DIAGNOSIS — Z1152 Encounter for screening for COVID-19: Secondary | ICD-10-CM

## 2022-06-25 DIAGNOSIS — Z885 Allergy status to narcotic agent status: Secondary | ICD-10-CM

## 2022-06-25 LAB — CBC WITH DIFFERENTIAL/PLATELET
Abs Immature Granulocytes: 0.2 10*3/uL — ABNORMAL HIGH (ref 0.00–0.07)
Basophils Absolute: 0.1 10*3/uL (ref 0.0–0.1)
Basophils Relative: 0 %
Eosinophils Absolute: 0.2 10*3/uL (ref 0.0–0.5)
Eosinophils Relative: 1 %
HCT: 34.6 % — ABNORMAL LOW (ref 36.0–46.0)
Hemoglobin: 11.5 g/dL — ABNORMAL LOW (ref 12.0–15.0)
Immature Granulocytes: 1 %
Lymphocytes Relative: 5 %
Lymphs Abs: 0.9 10*3/uL (ref 0.7–4.0)
MCH: 29.8 pg (ref 26.0–34.0)
MCHC: 33.2 g/dL (ref 30.0–36.0)
MCV: 89.6 fL (ref 80.0–100.0)
Monocytes Absolute: 1.6 10*3/uL — ABNORMAL HIGH (ref 0.1–1.0)
Monocytes Relative: 8 %
Neutro Abs: 17 10*3/uL — ABNORMAL HIGH (ref 1.7–7.7)
Neutrophils Relative %: 85 %
Platelets: 387 10*3/uL (ref 150–400)
RBC: 3.86 MIL/uL — ABNORMAL LOW (ref 3.87–5.11)
RDW: 14.5 % (ref 11.5–15.5)
WBC: 19.9 10*3/uL — ABNORMAL HIGH (ref 4.0–10.5)
nRBC: 0 % (ref 0.0–0.2)

## 2022-06-25 LAB — CBC
HCT: 32 % — ABNORMAL LOW (ref 36.0–46.0)
Hemoglobin: 10.7 g/dL — ABNORMAL LOW (ref 12.0–15.0)
MCH: 29.6 pg (ref 26.0–34.0)
MCHC: 33.4 g/dL (ref 30.0–36.0)
MCV: 88.4 fL (ref 80.0–100.0)
Platelets: 395 10*3/uL (ref 150–400)
RBC: 3.62 MIL/uL — ABNORMAL LOW (ref 3.87–5.11)
RDW: 14.6 % (ref 11.5–15.5)
WBC: 18.7 10*3/uL — ABNORMAL HIGH (ref 4.0–10.5)
nRBC: 0 % (ref 0.0–0.2)

## 2022-06-25 LAB — COMPREHENSIVE METABOLIC PANEL
ALT: 21 U/L (ref 0–44)
AST: 29 U/L (ref 15–41)
Albumin: 2.4 g/dL — ABNORMAL LOW (ref 3.5–5.0)
Alkaline Phosphatase: 87 U/L (ref 38–126)
Anion gap: 18 — ABNORMAL HIGH (ref 5–15)
BUN: 16 mg/dL (ref 8–23)
CO2: 20 mmol/L — ABNORMAL LOW (ref 22–32)
Calcium: 8.7 mg/dL — ABNORMAL LOW (ref 8.9–10.3)
Chloride: 90 mmol/L — ABNORMAL LOW (ref 98–111)
Creatinine, Ser: 0.89 mg/dL (ref 0.44–1.00)
GFR, Estimated: >60 mL/min (ref >60)
Glucose, Bld: 121 mg/dL — ABNORMAL HIGH (ref 70–99)
Potassium: 3.3 mmol/L — ABNORMAL LOW (ref 3.5–5.1)
Sodium: 128 mmol/L — ABNORMAL LOW (ref 135–145)
Total Bilirubin: 1.4 mg/dL — ABNORMAL HIGH (ref 0.3–1.2)
Total Protein: 5.7 g/dL — ABNORMAL LOW (ref 6.5–8.1)

## 2022-06-25 LAB — URINALYSIS, ROUTINE W REFLEX MICROSCOPIC
Bilirubin Urine: NEGATIVE
Glucose, UA: NEGATIVE mg/dL
Hgb urine dipstick: NEGATIVE
Ketones, ur: NEGATIVE mg/dL
Nitrite: NEGATIVE
Protein, ur: NEGATIVE mg/dL
Specific Gravity, Urine: 1.003 — ABNORMAL LOW (ref 1.005–1.030)
pH: 5 (ref 5.0–8.0)

## 2022-06-25 LAB — PROTIME-INR
INR: 1.5 — ABNORMAL HIGH (ref 0.8–1.2)
Prothrombin Time: 18.3 seconds — ABNORMAL HIGH (ref 11.4–15.2)

## 2022-06-25 LAB — LACTIC ACID, PLASMA: Lactic Acid, Venous: 1.7 mmol/L (ref 0.5–1.9)

## 2022-06-25 LAB — I-STAT CHEM 8, ED
BUN: 18 mg/dL (ref 8–23)
Calcium, Ion: 1.1 mmol/L — ABNORMAL LOW (ref 1.15–1.40)
Chloride: 90 mmol/L — ABNORMAL LOW (ref 98–111)
Creatinine, Ser: 0.7 mg/dL (ref 0.44–1.00)
Glucose, Bld: 124 mg/dL — ABNORMAL HIGH (ref 70–99)
HCT: 34 % — ABNORMAL LOW (ref 36.0–46.0)
Hemoglobin: 11.6 g/dL — ABNORMAL LOW (ref 12.0–15.0)
Potassium: 3.3 mmol/L — ABNORMAL LOW (ref 3.5–5.1)
Sodium: 127 mmol/L — ABNORMAL LOW (ref 135–145)
TCO2: 30 mmol/L (ref 22–32)

## 2022-06-25 LAB — BRAIN NATRIURETIC PEPTIDE: B Natriuretic Peptide: 297.2 pg/mL — ABNORMAL HIGH (ref 0.0–100.0)

## 2022-06-25 LAB — TROPONIN I (HIGH SENSITIVITY)
Troponin I (High Sensitivity): 5 ng/L (ref <18)
Troponin I (High Sensitivity): 4 ng/L (ref <18)

## 2022-06-25 LAB — TSH: TSH: 0.388 u[IU]/mL (ref 0.350–4.500)

## 2022-06-25 LAB — RESP PANEL BY RT-PCR (RSV, FLU A&B, COVID)  RVPGX2
Influenza A by PCR: NEGATIVE
Influenza B by PCR: NEGATIVE
Resp Syncytial Virus by PCR: NEGATIVE
SARS Coronavirus 2 by RT PCR: NEGATIVE

## 2022-06-25 LAB — APTT: aPTT: 37 seconds — ABNORMAL HIGH (ref 24–36)

## 2022-06-25 MED ORDER — PANTOPRAZOLE SODIUM 40 MG PO TBEC
40.0000 mg | DELAYED_RELEASE_TABLET | Freq: Every morning | ORAL | Status: DC
  Administered 2022-06-26 – 2022-06-29 (×4): 40 mg via ORAL
  Filled 2022-06-25 (×4): qty 1

## 2022-06-25 MED ORDER — DILTIAZEM HCL ER 60 MG PO CP12
120.0000 mg | ORAL_CAPSULE | Freq: Two times a day (BID) | ORAL | Status: DC
  Administered 2022-06-25 – 2022-06-29 (×9): 120 mg via ORAL
  Filled 2022-06-25 (×11): qty 2

## 2022-06-25 MED ORDER — APIXABAN 5 MG PO TABS
5.0000 mg | ORAL_TABLET | Freq: Two times a day (BID) | ORAL | Status: DC
  Administered 2022-06-25 – 2022-06-29 (×9): 5 mg via ORAL
  Filled 2022-06-25 (×9): qty 1

## 2022-06-25 MED ORDER — POTASSIUM CHLORIDE CRYS ER 20 MEQ PO TBCR
40.0000 meq | EXTENDED_RELEASE_TABLET | Freq: Once | ORAL | Status: AC
  Administered 2022-06-25: 40 meq via ORAL
  Filled 2022-06-25: qty 2

## 2022-06-25 MED ORDER — FUROSEMIDE 10 MG/ML IJ SOLN
40.0000 mg | Freq: Once | INTRAMUSCULAR | Status: AC
  Administered 2022-06-25: 40 mg via INTRAVENOUS
  Filled 2022-06-25: qty 4

## 2022-06-25 MED ORDER — AMIODARONE HCL IN DEXTROSE 360-4.14 MG/200ML-% IV SOLN
30.0000 mg/h | INTRAVENOUS | Status: DC
  Administered 2022-06-25 – 2022-06-26 (×3): 30 mg/h via INTRAVENOUS
  Filled 2022-06-25 (×2): qty 200

## 2022-06-25 MED ORDER — METOPROLOL TARTRATE 50 MG PO TABS
50.0000 mg | ORAL_TABLET | Freq: Two times a day (BID) | ORAL | Status: DC
  Administered 2022-06-25 – 2022-06-28 (×7): 50 mg via ORAL
  Filled 2022-06-25 (×9): qty 1
  Filled 2022-06-25: qty 2

## 2022-06-25 MED ORDER — FUROSEMIDE 10 MG/ML IJ SOLN
40.0000 mg | Freq: Two times a day (BID) | INTRAMUSCULAR | Status: AC
  Administered 2022-06-25 – 2022-06-28 (×6): 40 mg via INTRAVENOUS
  Filled 2022-06-25 (×6): qty 4

## 2022-06-25 MED ORDER — AMIODARONE HCL IN DEXTROSE 360-4.14 MG/200ML-% IV SOLN
30.0000 mg/h | INTRAVENOUS | Status: DC
  Filled 2022-06-25: qty 200

## 2022-06-25 MED ORDER — PRAVASTATIN SODIUM 10 MG PO TABS
20.0000 mg | ORAL_TABLET | Freq: Every evening | ORAL | Status: DC
  Administered 2022-06-25 – 2022-06-28 (×4): 20 mg via ORAL
  Filled 2022-06-25 (×4): qty 2

## 2022-06-25 NOTE — ED Provider Notes (Signed)
[facility] Provider Note   CSN: [id number] Arrival date & time: 06/25/22  [id number]     History  Chief Complaint  Patient presents with   Hypotension    Sheena Taylor is a 88 y.o. female.  HPI Patient presents for generalized weakness.  Medical history includes arthritis, atrial fibrillation, HTN, pericarditis, cardiac tamponade.  Home medications include amiodarone, Eliquis, diltiazem, metoprolol.  She had a recent hospital admission 4 weeks ago for cardiac Nausea and underwent pericardiocentesis with 600 cc of hemorrhagic pericardial fluid drawn on 2/18.  On 2/22, she underwent thoracentesis with 650 cc of right-sided yellow fluid drawn.  She was discharged from the hospital 2 weeks ago and has been residing in a rehab facility.  She states that over the past 3 days, she has had persistent generalized weakness.  Prior to that, she was ambulating.  She is now 2 weeks to walk.  EMS reports that facility noted low blood pressures over the weekend.  Patient states that they have held her blood pressure medications.  She has otherwise been receiving her medications as prescribed.  Currently, she denies any areas of discomfort.  She states that her breathing feels normal to her.    Home Medications Prior to Admission medications   Medication Sig Start Date End Date Taking? Authorizing Provider  acetaminophen (TYLENOL) 500 MG tablet Take 2 tablets (1,000 mg total) by mouth every 8 (eight) hours. 01/28/19   Danae Orleans, PA-C  amiodarone (PACERONE) 200 MG tablet Take 1 tablet (200 mg total) by mouth daily. 06/12/22   Patwardhan, Reynold Bowen, MD  diltiazem (CARDIZEM SR) 120 MG 12 hr capsule Take 1 capsule (120 mg total) by mouth every 12 (twelve) hours. 06/12/22   Patwardhan, Manish J, MD  ELIQUIS 5 MG TABS tablet TAKE 1 TABLET BY MOUTH TWICE A DAY Patient taking differently: Take 5 mg by mouth 2 (two) times daily. 03/07/21   Cantwell, Celeste C, PA-C   loperamide (IMODIUM) 2 MG capsule Take 1 capsule (2 mg total) by mouth every 8 (eight) hours as needed for diarrhea or loose stools. 06/12/22   Patwardhan, Reynold Bowen, MD  metoprolol tartrate (LOPRESSOR) 50 MG tablet Take 1 tablet (50 mg total) by mouth 2 (two) times daily. 06/12/22   Patwardhan, Reynold Bowen, MD  Multiple Vitamins-Minerals (MULTIVITAMIN WITH MINERALS) tablet Take 1 tablet by mouth daily.    [provider]  Multiple Vitamins-Minerals (OCUVITE PRESERVISION PO) Take 1 tablet by mouth 2 (two) times daily.    [provider]  pantoprazole (PROTONIX) 40 MG tablet Take 1 tablet (40 mg total) by mouth daily at 6 (six) AM. 06/12/22   Patwardhan, Manish J, MD  pravastatin (PRAVACHOL) 20 MG tablet Take 20 mg by mouth daily.    [provider]  vitamin C (ASCORBIC ACID) 500 MG tablet Take 500 mg by mouth daily after breakfast.    [provider]      Allergies    Codeine    Review of Systems   Review of Systems  Constitutional:  Positive for fatigue.  Neurological:  Positive for weakness (Generalized).  All other systems reviewed and are negative.   Physical Exam Updated Vital Signs BP 112/69   Pulse (!) 140   Temp (!) 97.3 F (36.3 C) (Oral)   Resp (!) 21   Ht '5\' 3"'$  (1.6 m)   Wt 68.9 kg   SpO2 96%   BMI 26.93 kg/m  Physical Exam Vitals and  nursing note reviewed.  Constitutional:      General: She is not in acute distress.    Appearance: Normal appearance. She is well-developed. She is not toxic-appearing or diaphoretic.  HENT:     Head: Normocephalic and atraumatic.     Right Ear: External ear normal.     Left Ear: External ear normal.     Nose: Nose normal.     Mouth/Throat:     Mouth: Mucous membranes are moist.  Eyes:     Extraocular Movements: Extraocular movements intact.     Conjunctiva/sclera: Conjunctivae normal.  Cardiovascular:     Rate and Rhythm: Tachycardia present. Rhythm irregular.  Pulmonary:     Effort: Pulmonary  effort is normal. No respiratory distress.     Breath sounds: Normal breath sounds. No wheezing or rales.  Chest:     Chest wall: No tenderness.  Abdominal:     General: There is no distension.     Palpations: Abdomen is soft.     Tenderness: There is no abdominal tenderness.  Musculoskeletal:        General: No swelling. Normal range of motion.     Cervical back: Normal range of motion and neck supple.     Right lower leg: No edema.     Left lower leg: No edema.  Skin:    General: Skin is warm and dry.     Capillary Refill: Capillary refill takes less than 2 seconds.     Coloration: Skin is not jaundiced or pale.  Neurological:     General: No focal deficit present.     Mental Status: She is alert and oriented to person, place, and time.     Cranial Nerves: No cranial nerve deficit.     Sensory: No sensory deficit.     Motor: No weakness.     Coordination: Coordination normal.  Psychiatric:        Mood and Affect: Mood normal.        Behavior: Behavior normal.        Thought Content: Thought content normal.        Judgment: Judgment normal.     ED Results / Procedures / Treatments   Labs (all labs ordered are listed, but only abnormal results are displayed) Labs Reviewed  COMPREHENSIVE METABOLIC PANEL - Abnormal; Notable for the following components:      Result Value   Sodium 128 (*)    Potassium 3.3 (*)    Chloride 90 (*)    CO2 20 (*)    Glucose, Bld 121 (*)    Calcium 8.7 (*)    Total Protein 5.7 (*)    Albumin 2.4 (*)    Total Bilirubin 1.4 (*)    Anion gap 18 (*)    All other components within normal limits  CBC WITH DIFFERENTIAL/PLATELET - Abnormal; Notable for the following components:   WBC 19.9 (*)    RBC 3.86 (*)    Hemoglobin 11.5 (*)    HCT 34.6 (*)    Neutro Abs 17.0 (*)    Monocytes Absolute 1.6 (*)    Abs Immature Granulocytes 0.20 (*)    All other components within normal limits  PROTIME-INR - Abnormal; Notable for the following components:    Prothrombin Time 18.3 (*)    INR 1.5 (*)    All other components within normal limits  APTT - Abnormal; Notable for the following components:   aPTT 37 (*)    All other components within normal  limits  BRAIN NATRIURETIC PEPTIDE - Abnormal; Notable for the following components:   B Natriuretic Peptide 297.2 (*)    All other components within normal limits  I-STAT CHEM 8, ED - Abnormal; Notable for the following components:   Sodium 127 (*)    Potassium 3.3 (*)    Chloride 90 (*)    Glucose, Bld 124 (*)    Calcium, Ion 1.10 (*)    Hemoglobin 11.6 (*)    HCT 34.0 (*)    All other components within normal limits  RESP PANEL BY RT-PCR (RSV, FLU A&B, COVID)  RVPGX2  CULTURE, BLOOD (ROUTINE X 2)  CULTURE, BLOOD (ROUTINE X 2)  LACTIC ACID, PLASMA  URINALYSIS, ROUTINE W REFLEX MICROSCOPIC  TROPONIN I (HIGH SENSITIVITY)  TROPONIN I (HIGH SENSITIVITY)    EKG EKG Interpretation  Date/Time:  Monday June 25 2022 09:03:08 EDT Ventricular Rate:  124 PR Interval:    QRS Duration: 92 QT Interval:  369 QTC Calculation: 530 R Axis:   -78 Text Interpretation: Atrial fibrillation Left anterior fascicular block Borderline low voltage, extremity leads Nonspecific repol abnormality, lateral leads Prolonged QT interval Confirmed by Godfrey Pick [phone]) on 06/25/2022 10:44:35 AM  Radiology DG Chest Port 1 View  Result Date: 06/25/2022 CLINICAL DATA:  Hypotension and dizziness. EXAM: PORTABLE CHEST 1 VIEW COMPARISON:  06/12/2022. FINDINGS: Trachea is midline. Heart is enlarged. Thoracic aorta is calcified. Enlarging bilateral pleural effusions, moderate on the right and small on the left. Mild bibasilar mixed interstitial and airspace opacification, similar. IMPRESSION: 1. Enlarging bilateral pleural effusions, moderate on the right and small on the left. 2. Bibasilar atelectasis. Electronically Signed   By: Lorin Picket M.D.   On: 06/25/2022 09:43    Procedures Procedures    Medications  Ordered in ED Medications  amiodarone (NEXTERONE PREMIX) 360-4.14 MG/200ML-% (1.8 mg/mL) IV infusion (30 mg/hr Intravenous New Bag/Given 06/25/22 1110)  potassium chloride SA (KLOR-CON M) CR tablet 40 mEq (has no administration in time range)  furosemide (LASIX) injection 40 mg (40 mg Intravenous Given 06/25/22 1113)    ED Course/ Medical Decision Making/ A&P                             Medical Decision Making Amount and/or Complexity of Data Reviewed Labs: ordered. Radiology: ordered.  Risk Prescription drug management.   This patient presents to the ED for concern of generalized weakness, this involves an extensive number of treatment options, and is a complaint that carries with it a high risk of complications and morbidity.  The differential diagnosis includes deconditioning, polypharmacy, arrhythmia, pericardial effusion, pleural effusion, infection, metabolic derangements   Co morbidities that complicate the patient evaluation  arthritis, atrial fibrillation, HTN, pericarditis, cardiac tamponade.  Home medications include amiodarone, Eliquis, diltiazem, metoprolol   Additional history obtained:  Additional history obtained from patient's husband External records from outside source obtained and reviewed including EMR   Lab Tests:  I Ordered, and personally interpreted labs.  The pertinent results include: Baseline hyponatremia, mild hypokalemia, mild anion gap metabolic acidosis, persistent leukocytosis, baseline anemia, moderate elevation in BNP, normal troponin   Imaging Studies ordered:  I ordered imaging studies including chest x-ray I independently visualized and interpreted imaging which showed enlarging bilateral pleural effusions I agree with the radiologist interpretation   Cardiac Monitoring: / EKG:  The patient was maintained on a cardiac monitor.  I personally viewed and interpreted the cardiac monitored which showed an underlying rhythm  of: Atrial  fibrillation   Consultations Obtained:  I requested consultation with the cardiologist, Dr. Shanon Brow,  and discussed lab and imaging findings as well as pertinent plan - they recommend: Admission to medicine, diuresis.  Cardiology will continue to follow.   Problem List / ED Course / Critical interventions / Medication management  Patient presents for 3 days of fatigue and generalized weakness.  EMS noted initial blood pressure of 88/64.  She had a prehospital heart rate of 148 with an irregular rhythm.  This is consistent with her known history of atrial fibrillation.  On arrival in the ED, blood pressure is improved.  She remains tachycardic.  On exam, she is alert and oriented.  She denies any areas of discomfort.  Her breathing is unlabored.  SpO2 is in the low 90s on room air.  She does not wear oxygen at baseline.  EKG shows atrial fibrillation with RVR.  Bedside ultrasound showed only trivial pericardial effusion.  She does have biatrial enlargement.  IVC is normal size and collapsible.  Laboratory workup was ordered.  Notable lab results are detailed above.  I spoke with cardiologist on-call, Dr. Shanon Brow, who is also the patient's cardiologist and with whom she had an appointment scheduled for later today.  He did come and evaluate the patient in the ED.  He feels that she is medically frail.  He advises against cardioversion at this time given that she did hold her Eliquis while undergoing prior cardiocentesis.  Patient may require a transesophageal cardioversion while admitted.  He does recommend rate control with amiodarone.  Patient is currently on 200 mg oral amiodarone a day.  IV infusion was started at 30 mg/h.  Cardiology also recommended diuresis.  Currently, patient is not on a daily diuretic.  Single dose of IV Lasix was ordered.  Potassium was replaced.  Patient remained tachycardic in the 120s.  Blood pressures remained stable in the 110s over 70s.  Patient was admitted to medicine  for further management. I ordered medication including amiodarone for atrial fibrillation; testing quite for hypokalemia; Lasix for diuresis Reevaluation of the patient after these medicines showed that the patient stayed the same I have reviewed the patients home medicines and have made adjustments as needed   Social Determinants of Health:  Currently residing in rehab facility  CRITICAL CARE Performed by: Godfrey Pick   Total critical care time: 32 minutes  Critical care time was exclusive of separately billable procedures and treating other patients.  Critical care was necessary to treat or prevent imminent or life-threatening deterioration.  Critical care was time spent personally by me on the following activities: development of treatment plan with patient and/or surrogate as well as nursing, discussions with consultants, evaluation of patient's response to treatment, examination of patient, obtaining history from patient or surrogate, ordering and performing treatments and interventions, ordering and review of laboratory studies, ordering and review of radiographic studies, pulse oximetry and re-evaluation of patient's condition.         Final Clinical Impression(s) / ED Diagnoses Final diagnoses:  Generalized weakness  Atrial fibrillation with RVR (HCC)  Pleural effusion, bilateral    Rx / DC Orders ED Discharge Orders     None         Godfrey Pick, MD 06/25/22 1120

## 2022-06-25 NOTE — H&P (Addendum)
Hospital Admission History and Physical Service Pager: [phone]  Patient name: Sheena Taylor Medical record number: [id number] Date of Birth: 1935/04/04 Age: 88 y.o. Gender: female  Primary Care Provider: Garwin Brothers, MD Consultants: Cardiology Code Status: Full Preferred Emergency Contact: Husband  Sheena Taylor [phone]   Chief Complaint: Fatigue, shortness of breath   Assessment and Plan: Sheena Taylor is a 88 y.o. female presenting with shortness of breath and fatigue for the last 3 days. Differential for this patient's presentation of this includes recurrent pericardial effusion, recurrent pleural effusion, pneumonia, PE, deconditioning.  Recurrent pericardial effusion less likely given only trace effusion seen on bedside ultrasound.  Current pleural effusion seen on CXR in ED, although unclear etiology at this time.  Pneumonia less likely given lack of fever or consolidation seen on CXR.  PE unlikely given Well's score 1.5, current Eliquis use, and lack of DVT signs or symptoms.  Deconditioning certainly possible given weakness after last hospitalization, however unlikely to account for acute change over the last 3 days.  * Pleural effusion Likely cause of shortness of breath, reaccumulation of fluid seen on CXR in ED. SpO2 stable on room air at this time. Good UOP s/p IV Lasix x 1 in ED - Admit to family medicine teaching service, progressive floor, attending Dr. Milta Deiters - Cardiology consulted, appreciate their ongoing care - IV Lasix 40 mg twice daily - Strict ins and outs for UOP - Supplemental O2 as needed for SpO2 <92%  Pericardial effusion Trace pericardial effusion seen on bedside ultrasound in ED.  Given previous recent pericardial effusion with tamponade requiring emergent pericardiocentesis and the fact that patient is now back on Eliquis, will obtain complete echo for surveillance.  Paroxysmal atrial fibrillation (HCC) In atrial fibrillation here upon  presentation.  Cardiology believes she is too medically frail for cardioversion at this time (patient also politely declines at this time).  In addition, her Eliquis was held during her last hospitalization due to emergency pericardiocentesis; just restarted upon hospital discharge 2/27. - Cardiology consulted, appreciate their care of this patient - Hold home p.o. amiodarone - Continue home p.o. diltiazem - IV amiodarone 30 mg/h per cardiology - TSH add on   Hypotension Reported hypotension en route with EMS, however normotensive here in the ED. - Hold home HCTZ, lisinopril -Trend BMP, consider adding back lisinopril first if needed   FEN/GI: Full regular diet VTE Prophylaxis: Eliquis 5 mg twice daily  Disposition: Progressive  History of Present Illness:  Sheena Taylor is a 88 y.o. female presenting via EMS from [facility] rehab for 3 days of weakness, orthostasis, and hypotension. EMS found BP 86/64 with heart rate 148.  In the ED, was found to have slight hypokalemia to 3.3, low ionized calcium at 1.1, stable anemia 11.6. PT, INR, APTT elevated. CXR shows large bilateral pleural effusions (right moderate, left small) and bibasilar atelectasis. Bedside ultrasound showing only trivial pericardial effusion, with biatrial enlargement.   Dr. Shanon Brow, cardiology, recommended admission to medicine and diuresis.  Believes she is medically frail and to probably not tolerate cardioversion at this time.  Started amiodarone IV 30 mg/h, recommend diuresis for bilateral pleural effusions.  ED gave IV Lasix 40 mg x 1.  Of note, was recently admitted to the cardiology service here at [facility] 2/18 - 2/27 for shortness of breath, was found to have cardiac tamponade. She underwent emergency pericardiocentesis with 600 cc hemorrhagic pericardial fluid drawn. Cardiologist suspected pericarditis leading to pericardial effusion. Treated with aspirin  and colchicine, although aspirin later stopped due  to concern for GI side effects. Eliquis restarted on discharge.   On 2/22 underwent right-sided thoracentesis with 650 cc yellow fluid drawn, cytology negative for malignancy.   A-fib here required IV diltiazem followed by IV amiodarone, transition to p.o.  Review Of Systems: Per HPI with the following additions: None  Pertinent Past Medical History: A-fib with RVR Acute idiopathic pericarditis Cardiac tamponade Hypertension Macular degeneration  Remainder reviewed in history tab.   Pertinent Past Surgical History: Bilateral excisional breast biopsy 1992 Laparotomy for repair ectopic pregnancy Partial abdominal hysterectomy Left cataract extraction  Bilateral varicose vein surgery and Bilateral bunionectomy Amputation second right toe Right total knee arthroplasty 2016 Left total knee arthroplasty 2020 Pericardiocentesis 2024  Remainder reviewed in history tab.   Pertinent Social History: Tobacco use: None Alcohol use: Every other day, 1-2 glasses of wine per episode Other Substance use: None Lives with husband in independent living at [facility]  Pertinent Family History: Heart disease-mother Breast cancer-niece  Remainder reviewed in history tab.   Important Outpatient Medications: Amiodarone 20 mg daily Diltiazem 12-hour capsule 120 mg twice daily Eliquis 5 mg twice daily (of note, held for pericardiocentesis in January) Loperamide/Imodium Metoprolol 50 mg tablets twice daily Multivitamin Pantoprazole 40 mg Pravastatin 20 mg daily  Remainder reviewed in medication history.   Objective: BP 115/62   Pulse (!) 129   Temp 98.2 F (36.8 C) (Oral)   Resp (!) 26   Ht '5\' 3"'$  (1.6 m)   Wt 68.9 kg   SpO2 97%   BMI 26.93 kg/m  Exam: General: Awake, alert, ill-appearing Eyes: Sclera anicteric, noninjected ENTM: Moist oral mucosa Cardiovascular: Irregularly irregular rhythm, no murmurs auscultated Respiratory: Clear anterior fields, reduced breath sounds  posterior right lower lobe  Labs:  CBC BMET  Recent Labs  Lab 06/25/22 0908 06/25/22 0936  WBC 19.9*  --   HGB 11.5* 11.6*  HCT 34.6* 34.0*  PLT 387  --    Recent Labs  Lab 06/25/22 0908 06/25/22 0936  NA 128* 127*  K 3.3* 3.3*  CL 90* 90*  CO2 20*  --   BUN 16 18  CREATININE 0.89 0.70  GLUCOSE 121* 124*  CALCIUM 8.7*  --     PT 18.3 INR 1.5 APTT 37 BNP 27.2 Troponin 5, awaiting second Lactic acid 1.7 Respiratory panel negative for flu, COVID, RSV Blood culture in process  EKG: A-fib with ventricular rate 124, prolonged QTC 530  Imaging Studies Performed: CXR 06/25/2022 IMPRESSION: 1. Enlarging bilateral pleural effusions, moderate on the right and small on the left. 2. Bibasilar atelectasis.   Ezequiel Essex, MD 06/25/2022, 1:22 PM PGY-3, [place] Intern pager: [phone], text pages welcome Secure chat group [facility]

## 2022-06-25 NOTE — Assessment & Plan Note (Addendum)
BP stable overnight. - continue to hold home HCTZ, lisinopril -Trend BMP, consider adding back lisinopril first if needed

## 2022-06-25 NOTE — ED Notes (Signed)
Pt purewick and brief changed

## 2022-06-25 NOTE — Progress Notes (Signed)
FMTS Interim Progress Note  S: In to see patient with Dr. Joelyn Oms.  Patient in no acute distress.  States that she is worried because she is back in the hospital after her last visit 1 month ago.  Does not wish to have to undergo repeat thoracentesis or pericardiocentesis.  Denies difficulty breathing at this time.  No chest pain.  O: BP 108/74 (BP Location: Left Arm)   Pulse (!) 108   Temp 98.5 F (36.9 C) (Oral)   Resp (!) 24   Ht '5\' 3"'$  (1.6 m)   Wt 68.9 kg   SpO2 94%   BMI 26.93 kg/m   General: NAD  Cardiovascular: Irregular rate and rhythm, no murmurs, no peripheral edema Respiratory: Mild suprasternal retractions, on room air, decreased breath sounds right lower lung fields, decreased air movement at left lung base, patient had desaturation below 80 with leaning forward, improved with relaxation Abdomen: soft, NTTP, no rebound or guarding Extremities: Moving all 4 extremities equally   A/P: Pleural effusion Attempting volume reduction via diuresis before considering repeat thoracentesis.  Respiratory status currently stable.  Continue to monitor urine output.  Good UOP thus far. -Cardiology consulted, appreciate recs -Monitor I/Os -Likely redosed Lasix in a.m. -Consider thoracentesis  Proximal atrial fibrillation Continues to be in atrial fibrillation on the monitor and on exam.  Heart rate controlled on amiodarone drip averaging 100-110s.  Cardiology following, likely titrate as able.  Currently asymptomatic with stable blood pressure. -Continue IV amiodarone -Continue home p.o. diltiazem -Continue metoprolol -Echo pending  Remainder of plan per day team, orders reviewed.  Salvadore Oxford, MD 06/25/2022, 8:57 PM PGY-1, [facility] pager [phone]

## 2022-06-25 NOTE — Assessment & Plan Note (Addendum)
Repeat echo with small pericardial effusion -stable per cardiology

## 2022-06-25 NOTE — Progress Notes (Signed)
Family medicine teaching service will be admitting this patient. Our pager information can be located in the physician sticky notes, treatment team sticky notes, and the headers of all our official daily progress notes.   FAMILY MEDICINE TEACHING SERVICE Patient - Please contact intern pager [phone] or text page via website [email or web address] (login: [email or web address]) for questions regarding care. DO NOT page listed attending provider unless there is no answer from the number above.   Sheena Grasse, MD PGY-3, [facility] pager [phone]   

## 2022-06-25 NOTE — Consult Note (Addendum)
CARDIOLOGY CONSULT NOTE  Patient ID: Sheena Taylor MRN: [id number] DOB/AGE: 1934/08/28 88 y.o.  Admit date: 06/25/2022 Referring Physician: [name], ER Reason for Consultation: A-fib  HPI:   88 year old Caucasian female with hypertension, arthritis, paroxysmal atrial fibrillation, cardiac tamponade requiring emergency pericardiocentesis in 05/2022, bilateral pleural effusions requiring Rt sided thoracentesis in 05/2022, admitted back with worsening shortness of breath.  Patient was discharged to [facility] short-term nursing facility on 06/12/2022.  Patient had follow-up last day or so, shortness of breath worsened leading to ER visit today.  In the ER, patient has been in A-fib RVR, briefly hypotensive in the 80s/60s, but blood pressure remaining >100/80 mmHg for the most part.  BNP is mildly elevated.  Chest x-ray showed worsening bilateral pleural effusions.  Denies any did not show any significant pericardial effusion.  Cardioversion was deferred during last hospitalization owing to interruption of Eliquis due to bloody pericardial effusion and was only resumed after discharge 06/12/2022.  She has been compliant on Eliquis since then.     Past Medical History:  Diagnosis Date   Arthritis    Atrial fibrillation (HCC)    Complication of anesthesia    Dr. 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   History of laparotomy    for ectopic pregnancy   Hypertension    Macular degeneration    Pneumonia    as a child   S/P bunionectomy    bilateral and hammer toe-correction     Past Surgical History:  Procedure Laterality Date   ABDOMINAL HYSTERECTOMY     partial   BREAST EXCISIONAL BIOPSY Left 1992   BREAST EXCISIONAL BIOPSY Right 1992   BUNIONECTOMY     bilateral with hammer toe and  amputation 2nd right toe with this   CATARACT EXTRACTION Left    IR THORACENTESIS ASP PLEURAL SPACE W/IMG GUIDE  06/07/2022   LAPAROTOMY     for ruptured ectopic pregnancy   PERICARDIOCENTESIS N/A 06/03/2022   Procedure: PERICARDIOCENTESIS;  Surgeon: Nigel Mormon, MD;  Location: [facility];  Service: Cardiovascular;  Laterality: N/A;   TOTAL KNEE ARTHROPLASTY Right 01/17/2015   Procedure: TOTAL RIGHT KNEE ARTHROPLASTY;  Surgeon: Paralee Cancel, MD;  Location: [facility];  Service: Orthopedics;  Laterality: Right;   TOTAL KNEE ARTHROPLASTY Left 01/27/2019   Procedure: TOTAL KNEE ARTHROPLASTY;  Surgeon: Paralee Cancel, MD;  Location: [facility];  Service: Orthopedics;  Laterality: Left;  70 mins   VARICOSE VEIN SURGERY     bilateral      Family History  Problem Relation Age of Onset   Heart disease Mother    Breast cancer Niece        twice     Social History: Social History   Socioeconomic History   Marital status: Married    Spouse name: Not on file   Number of children: 2   Years of education: Not on file   Highest education level: Not on file  Occupational History   Not on file  Tobacco Use   Smoking status: Light Smoker    Types: Cigarettes   Smokeless tobacco: Never   Tobacco comments:    smokes about once a week when having a glass of wine  Vaping  Use   Vaping Use: Never used  Substance and Sexual Activity   Alcohol use: Yes    Comment: occassionally  wine   Drug use: No   Sexual activity: Not Currently  Other Topics Concern   Not on file  Social History Narrative   Not on file   Social Determinants of Health   Financial Resource Strain: Not on file  Food Insecurity: No Food Insecurity (06/03/2022)   Hunger Vital Sign    Worried About Running Out of Food in the Last Year: Never true    Ran Out of Food in the Last Year: Never true  Transportation Needs: No Transportation Needs (06/03/2022)   PRAPARE - Hydrologist  (Medical): No    Lack of Transportation (Non-Medical): No  Physical Activity: Not on file  Stress: Not on file  Social Connections: Not on file  Intimate Partner Violence: Not At Risk (06/03/2022)   Humiliation, Afraid, Rape, and Kick questionnaire    Fear of Current or Ex-Partner: No    Emotionally Abused: No    Physically Abused: No    Sexually Abused: No     (Not in a hospital admission)   Review of Systems  Constitutional: Positive for malaise/fatigue.  Cardiovascular:  Positive for dyspnea on exertion. Negative for chest pain, leg swelling, palpitations and syncope.  Respiratory:  Positive for shortness of breath.       Physical Exam: Physical Exam Vitals and nursing note reviewed.  Constitutional:      General: She is not in acute distress.    Appearance: She is ill-appearing.  Neck:     Vascular: No JVD.  Cardiovascular:     Rate and Rhythm: Tachycardia present. Rhythm irregular.     Heart sounds: Normal heart sounds. No murmur heard. Pulmonary:     Effort: Pulmonary effort is normal.     Breath sounds: Normal breath sounds. No wheezing or rales.  Musculoskeletal:     Right lower leg: Edema (Trace) present.     Left lower leg: Edema (Trace) present.        Imaging/tests reviewed and independently interpreted: Lab Results: CBC, BMP, BNP  CXR 06/25/2022: 1. Enlarging bilateral pleural effusions, moderate on the right and small on the left. 2. Bibasilar atelectasis.  Cardiac Studies:  Telemetry 06/25/2022: A-fib with RVR  EKG 06/25/2022: A-fib with RVR LAFB  Borderline low voltage  Echocardiogram 06/11/2022:  1. Left ventricular ejection fraction, by estimation, is 55 to 60%. The  left ventricle has normal function. The left ventricle has no regional  wall motion abnormalities.   2. The inferior vena cava is dilated in size with >50% respiratory  variability, suggesting right atrial pressure of 8 mmHg.   Comparison(s): Compared to previous study  on 06/04/2022, IVC now dilated.  Pleural effusion better visualized.    Assessment & Recommendations:  88 year old Caucasian female with hypertension, arthritis, paroxysmal atrial fibrillation, cardiac tamponade requiring emergency pericardiocentesis in 05/2022, bilateral pleural effusions requiring Rt sided thoracentesis in 05/2022, admitted back with worsening shortness of breath.  A-fib with RVR: Paroxysmal A-fib, patient has been in A-fib since previous hospitalization with cardiac tamponade in 05/2022. Rate was well-controlled on discharge going to cardiac tamponade, as well as thoracentesis that resolved pleural effusion, especially on the right. Current RVR seems to be driven by her worsening pleural effusions. While she remains in RVR, she is not consistently hypertensive to warrant emergency cardioversion. Given recent interruption in anticoagulation, there is elevated risk of stroke  in performing cardioversion without TEE. I do not think she is stable enough to perform TEE cardioversion this time due to her respiratory status and pleural effusions. Recommend IV amiodarone for A-fib at this time.  Okay to hold oral amiodarone at this time. CHA2DS2VASc score at least 4, annual stroke risk 5%.  Continue Eliquis 5 mg twice daily.  With regards to what is causing his recurrent pleural effusions, this probably an interplay between A-fib RVR and possible underlying HFpEF.  Her EF is preserved, and high-grade diastolic function assessment has been limited due to her underlying A-fib.  However, I it is probably safe to assume that she has underlying HFpEF.  I will treat with diuresis with IV Lasix 40 mg twice daily.  I will check limited echocardiogram to confirm no significant pericardial effusion.  After adequate diuresis and perhaps thoracentesis, we could consider TEE guided cardioversion at some point.  We will continue to follow.  Discussed interpretation of tests and management  recommendations with the primary team     Nigel Mormon, MD Pager: [phone] Office: [phone]

## 2022-06-25 NOTE — ED Notes (Signed)
ED TO INPATIENT HANDOFF REPORT  ED Nurse Name and Phone #: [phone]  S Name/Age/Gender Sheena Taylor 88 y.o. female Room/Bed: 038C/038C  Code Status   Code Status: Full Code  Home/SNF/Other Home Patient oriented to: self, place, time, and situation Is this baseline? Yes   Triage Complete: Triage complete  Chief Complaint Hypotension [I95.9]  Triage Note Patient presents to ed via [facility]  from [place] , states she has had issues with blood pressure being low all weekend, this am c/o feeling dizzy when standing. States b/p was 88/64 with HR of 148 denies chest pain or sob.    Allergies Allergies  Allergen Reactions   Codeine Nausea And Vomiting    Level of Care/Admitting Diagnosis ED Disposition     ED Disposition  Admit   Condition  --   [place]: [place] [[id number]]  Level of Care: Progressive [102]  Admit to Progressive based on following criteria: CARDIOVASCULAR & THORACIC of moderate stability with acute coronary syndrome symptoms/low risk myocardial infarction/hypertensive urgency/arrhythmias/heart failure potentially compromising stability and stable post cardiovascular intervention patients.  May place patient in observation at [facility] or [place] if equivalent level of care is available:: No  Covid Evaluation: Asymptomatic - no recent exposure (last 10 days) testing not required  Diagnosis: Hypotension [id number]  Admitting Physician: Ezequiel Essex [id number]  Attending Physician: Dickie La [[id number]]          B Medical/Surgery History Past Medical History:  Diagnosis Date   Arthritis    Atrial fibrillation ([place])    Complication of anesthesia    Dr. 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   History of  laparotomy    for ectopic pregnancy   Hypertension    Macular degeneration    Pneumonia    as a child   S/P bunionectomy    bilateral and hammer toe-correction   Past Surgical History:  Procedure Laterality Date   ABDOMINAL HYSTERECTOMY     partial   BREAST EXCISIONAL BIOPSY Left 1992   BREAST EXCISIONAL BIOPSY Right 1992   BUNIONECTOMY     bilateral with hammer toe and amputation 2nd right toe with this   CATARACT EXTRACTION Left    IR THORACENTESIS ASP PLEURAL SPACE W/IMG GUIDE  06/07/2022   LAPAROTOMY     for ruptured ectopic pregnancy   PERICARDIOCENTESIS N/A 06/03/2022   Procedure: PERICARDIOCENTESIS;  Surgeon: Nigel Mormon, MD;  Location: [place] [facility];  Service: Cardiovascular;  Laterality: N/A;   TOTAL KNEE ARTHROPLASTY Right 01/17/2015   Procedure: TOTAL RIGHT KNEE ARTHROPLASTY;  Surgeon: Paralee Cancel, MD;  Location: [facility];  Service: Orthopedics;  Laterality: Right;   TOTAL KNEE ARTHROPLASTY Left 01/27/2019   Procedure: TOTAL KNEE ARTHROPLASTY;  Surgeon: Paralee Cancel, MD;  Location: [facility];  Service: Orthopedics;  Laterality: Left;  70 mins   VARICOSE VEIN SURGERY     bilateral     A IV Location/Drains/Wounds Patient Lines/Drains/Airways Status     Active Line/Drains/Airways     Name Placement date Placement time Site Days   Peripheral IV 06/25/22 20 G Anterior;Right Forearm 06/25/22  0907  Forearm  less than 1   Peripheral IV 06/25/22 20 G  Posterior;Right Hand 06/25/22  0945  Hand  less than 1   External Urinary Catheter 06/25/22  1115  --  less than 1            Intake/Output Last 24 hours  Intake/Output Summary (Last 24 hours) at 06/25/2022 1533 Last data filed at 06/25/2022 1420 Gross per 24 hour  Intake --  Output 1700 ml  Net -1700 ml    Labs/Imaging Results for orders placed or performed during the hospital encounter of 06/25/22 (from the past 48 hour(s))  Comprehensive metabolic panel     Status: Abnormal   Collection Time:  06/25/22  9:08 AM  Result Value Ref Range   Sodium 128 (L) 135 - 145 mmol/L   Potassium 3.3 (L) 3.5 - 5.1 mmol/L   Chloride 90 (L) 98 - 111 mmol/L   CO2 20 (L) 22 - 32 mmol/L   Glucose, Bld 121 (H) 70 - 99 mg/dL    Comment: Glucose reference range applies only to samples taken after fasting for at least 8 hours.   BUN 16 8 - 23 mg/dL   Creatinine, Ser 0.89 0.44 - 1.00 mg/dL   Calcium 8.7 (L) 8.9 - 10.3 mg/dL   Total Protein 5.7 (L) 6.5 - 8.1 g/dL   Albumin 2.4 (L) 3.5 - 5.0 g/dL   AST 29 15 - 41 U/L   ALT 21 0 - 44 U/L   Alkaline Phosphatase 87 38 - 126 U/L   Total Bilirubin 1.4 (H) 0.3 - 1.2 mg/dL   GFR, Estimated >60 >60 mL/min    Comment: (NOTE) Calculated using the CKD-EPI Creatinine Equation (2021)    Anion gap 18 (H) 5 - 15    Comment: Performed at [facility], [place] [address], [place], [place] [address]  CBC with Differential     Status: Abnormal   Collection Time: 06/25/22  9:08 AM  Result Value Ref Range   WBC 19.9 (H) 4.0 - 10.5 K/uL   RBC 3.86 (L) 3.87 - 5.11 MIL/uL   Hemoglobin 11.5 (L) 12.0 - 15.0 g/dL   HCT 34.6 (L) 36.0 - 46.0 %   MCV 89.6 80.0 - 100.0 fL   MCH 29.8 26.0 - 34.0 pg   MCHC 33.2 30.0 - 36.0 g/dL   RDW 14.5 11.5 - 15.5 %   Platelets 387 150 - 400 K/uL   nRBC 0.0 0.0 - 0.2 %   Neutrophils Relative % 85 %   Neutro Abs 17.0 (H) 1.7 - 7.7 K/uL   Lymphocytes Relative 5 %   Lymphs Abs 0.9 0.7 - 4.0 K/uL   Monocytes Relative 8 %   Monocytes Absolute 1.6 (H) 0.1 - 1.0 K/uL   Eosinophils Relative 1 %   Eosinophils Absolute 0.2 0.0 - 0.5 K/uL   Basophils Relative 0 %   Basophils Absolute 0.1 0.0 - 0.1 K/uL   Immature Granulocytes 1 %   Abs Immature Granulocytes 0.20 (H) 0.00 - 0.07 K/uL    Comment: Performed at [facility] [address] [place], [place] [address]  Protime-INR     Status: Abnormal   Collection Time: 06/25/22  9:08 AM  Result Value Ref Range   Prothrombin Time 18.3 (H) 11.4 - 15.2 seconds   INR 1.5 (H) 0.8 - 1.2     Comment: (NOTE) INR goal varies based on device and disease states. Performed at [facility], [place] [address] [place], [place] [address]   APTT     Status:  Abnormal   Collection Time: 06/25/22  9:08 AM  Result Value Ref Range   aPTT 37 (H) 24 - 36 seconds    Comment:        IF BASELINE aPTT IS ELEVATED, SUGGEST PATIENT RISK ASSESSMENT BE USED TO DETERMINE APPROPRIATE ANTICOAGULANT THERAPY. Performed at [facility], [place] [address], [place], [place] [address]   Brain natriuretic peptide     Status: Abnormal   Collection Time: 06/25/22  9:08 AM  Result Value Ref Range   B Natriuretic Peptide 297.2 (H) 0.0 - 100.0 pg/mL    Comment: Performed at [facility] [address], [place], [place] [address]  Troponin I (High Sensitivity)     Status: None   Collection Time: 06/25/22  9:08 AM  Result Value Ref Range   Troponin I (High Sensitivity) 5 <18 ng/L    Comment: (NOTE) Elevated high sensitivity troponin I (hsTnI) values and significant  changes across serial measurements may suggest ACS but many other  chronic and acute conditions are known to elevate hsTnI results.  Refer to the "Links" section for chest pain algorithms and additional  guidance. Performed at [facility], [place] [address] [place], [place] [address]   Resp panel by RT-PCR (RSV, Flu A&B, Covid) Anterior Nasal Swab     Status: None   Collection Time: 06/25/22  9:26 AM   Specimen: Anterior Nasal Swab  Result Value Ref Range   SARS Coronavirus 2 by RT PCR NEGATIVE NEGATIVE   Influenza A by PCR NEGATIVE NEGATIVE   Influenza B by PCR NEGATIVE NEGATIVE    Comment: (NOTE) The Xpert Xpress SARS-CoV-2/FLU/RSV plus assay is intended as an aid in the diagnosis of influenza from Nasopharyngeal swab specimens and should not be used as a sole basis for treatment. Nasal washings and aspirates are unacceptable for Xpert Xpress SARS-CoV-2/FLU/RSV testing.  Fact Sheet for  Patients: EntrepreneurPulse.com.au  Fact Sheet for Healthcare Providers: IncredibleEmployment.be  This test is not yet approved or cleared by the [place] FDA and has been authorized for detection and/or diagnosis of SARS-CoV-2 by FDA under an Emergency Use Authorization (EUA). This EUA will remain in effect (meaning this test can be used) for the duration of the COVID-19 declaration under Section 564(b)(1) of the Act, 21 U.S.C. section 360bbb-3(b)(1), unless the authorization is terminated or revoked.     Resp Syncytial Virus by PCR NEGATIVE NEGATIVE    Comment: (NOTE) Fact Sheet for Patients: EntrepreneurPulse.com.au  Fact Sheet for Healthcare Providers: IncredibleEmployment.be  This test is not yet approved or cleared by the [place] FDA and has been authorized for detection and/or diagnosis of SARS-CoV-2 by FDA under an Emergency Use Authorization (EUA). This EUA will remain in effect (meaning this test can be used) for the duration of the COVID-19 declaration under Section 564(b)(1) of the Act, 21 U.S.C. section 360bbb-3(b)(1), unless the authorization is terminated or revoked.  Performed at [facility], [place] [address], [place], [place] [address]   Lactic acid, plasma     Status: None   Collection Time: 06/25/22  9:36 AM  Result Value Ref Range   Lactic Acid, Venous 1.7 0.5 - 1.9 mmol/L    Comment: Performed at [facility] [address] [place], [place] [address]  Blood Culture (routine x 2)     Status: None (Preliminary result)   Collection Time: 06/25/22  9:36 AM   Specimen: BLOOD RIGHT FOREARM  Result Value Ref Range  Specimen Description BLOOD RIGHT FOREARM    Special Requests      BOTTLES DRAWN AEROBIC AND ANAEROBIC Blood Culture adequate volume   Culture      NO GROWTH < 12 HOURS Performed at [facility], [place] [address], [place], [place] [address]     Report Status PENDING   I-Stat Chem 8, ED     Status: Abnormal   Collection Time: 06/25/22  9:36 AM  Result Value Ref Range   Sodium 127 (L) 135 - 145 mmol/L   Potassium 3.3 (L) 3.5 - 5.1 mmol/L   Chloride 90 (L) 98 - 111 mmol/L   BUN 18 8 - 23 mg/dL   Creatinine, Ser 0.70 0.44 - 1.00 mg/dL   Glucose, Bld 124 (H) 70 - 99 mg/dL    Comment: Glucose reference range applies only to samples taken after fasting for at least 8 hours.   Calcium, Ion 1.10 (L) 1.15 - 1.40 mmol/L   TCO2 30 22 - 32 mmol/L   Hemoglobin 11.6 (L) 12.0 - 15.0 g/dL   HCT 34.0 (L) 36.0 - 46.0 %  Blood Culture (routine x 2)     Status: None (Preliminary result)   Collection Time: 06/25/22  9:45 AM   Specimen: BLOOD RIGHT HAND  Result Value Ref Range   Specimen Description BLOOD RIGHT HAND    Special Requests      BOTTLES DRAWN AEROBIC AND ANAEROBIC Blood Culture adequate volume   Culture      NO GROWTH < 12 HOURS Performed at [facility], [place] [address] [place], [place] [address]    Report Status PENDING   Troponin I (High Sensitivity)     Status: None   Collection Time: 06/25/22 11:25 AM  Result Value Ref Range   Troponin I (High Sensitivity) 4 <18 ng/L    Comment: (NOTE) Elevated high sensitivity troponin I (hsTnI) values and significant  changes across serial measurements may suggest ACS but many other  chronic and acute conditions are known to elevate hsTnI results.  Refer to the "Links" section for chest pain algorithms and additional  guidance. Performed at [facility], [place] [address] [place], [place] [address]   Urinalysis, Routine w reflex microscopic -Urine, Clean Catch     Status: Abnormal   Collection Time: 06/25/22  2:07 PM  Result Value Ref Range   Color, Urine STRAW (A) YELLOW   APPearance CLEAR CLEAR   Specific Gravity, Urine 1.003 (L) 1.005 - 1.030   pH 5.0 5.0 - 8.0   Glucose, UA NEGATIVE NEGATIVE mg/dL   Hgb urine dipstick NEGATIVE NEGATIVE   Bilirubin Urine NEGATIVE  NEGATIVE   Ketones, ur NEGATIVE NEGATIVE mg/dL   Protein, ur NEGATIVE NEGATIVE mg/dL   Nitrite NEGATIVE NEGATIVE   Leukocytes,Ua SMALL (A) NEGATIVE   RBC / HPF 0-5 0 - 5 RBC/hpf   WBC, UA 0-5 0 - 5 WBC/hpf   Bacteria, UA MANY (A) NONE SEEN   Squamous Epithelial / HPF 0-5 0 - 5 /HPF    Comment: Performed at [facility], [address], [place], [place] [address]   DG Chest Port 1 View  Result Date: 06/25/2022 CLINICAL DATA:  Hypotension and dizziness. EXAM: PORTABLE CHEST 1 VIEW COMPARISON:  06/12/2022. FINDINGS: Trachea is midline. Heart is enlarged. Thoracic aorta is calcified. Enlarging bilateral pleural effusions, moderate on the right and small on the left. Mild bibasilar mixed interstitial and airspace opacification, similar. IMPRESSION: 1. Enlarging bilateral pleural effusions,  moderate on the right and small on the left. 2. Bibasilar atelectasis. Electronically Signed   By: Lorin Picket M.D.   On: 06/25/2022 09:43    Pending Labs Unresulted Labs (From admission, onward)     Start     Ordered   06/26/22 [id number]  Basic metabolic panel  Tomorrow morning,   R        06/25/22 1320   06/26/22 0500  CBC  Tomorrow morning,   R        06/25/22 1320   06/25/22 1322  TSH  Add-on,   AD        06/25/22 1321   06/25/22 1322  Hemoglobin A1c  Add-on,   AD        06/25/22 1321            Vitals/Pain Today's Vitals   06/25/22 1400 06/25/22 1430 06/25/22 1445 06/25/22 1500  BP: 98/72 109/67 110/68 119/76  Pulse: (!) 109 (!) 102 (!) 118 (!) 109  Resp: (!) 27 (!) 31 (!) 32 (!) 26  Temp:      TempSrc:      SpO2: 95% 97% 97% 95%  Weight:      Taylor:      PainSc:        Isolation Precautions No active isolations  Medications Medications  amiodarone (NEXTERONE PREMIX) 360-4.14 MG/200ML-% (1.8 mg/mL) IV infusion (30 mg/hr Intravenous New Bag/Given 06/25/22 1110)  apixaban (ELIQUIS) tablet 5 mg (5 mg Oral Given 06/25/22 1308)  metoprolol tartrate (LOPRESSOR) tablet 50 mg (50  mg Oral Given 06/25/22 1307)  pantoprazole (PROTONIX) EC tablet 40 mg (has no administration in time range)  pravastatin (PRAVACHOL) tablet 20 mg (has no administration in time range)  furosemide (LASIX) injection 40 mg (has no administration in time range)  diltiazem (CARDIZEM SR) 12 hr capsule 120 mg (120 mg Oral Given 06/25/22 1403)  furosemide (LASIX) injection 40 mg (40 mg Intravenous Given 06/25/22 1113)  potassium chloride SA (KLOR-CON M) CR tablet 40 mEq (40 mEq Oral Given 06/25/22 1121)    Mobility walks     Focused Assessments Cardiac Assessment Handoff:  Cardiac Rhythm: Atrial fibrillation No results found for: "CKTOTAL", "CKMB", "CKMBINDEX", "TROPONINI" No results found for: "DDIMER" Does the Patient currently have chest pain? No    R Recommendations: See Admitting Provider Note  Report given to:   Additional Notes:

## 2022-06-25 NOTE — ED Triage Notes (Signed)
Patient presents to ed via [facility]  from [place] , states she has had issues with blood pressure being low all weekend, this am c/o feeling dizzy when standing. States b/p was 88/64 with HR of 148 denies chest pain or sob.

## 2022-06-25 NOTE — Assessment & Plan Note (Addendum)
Noted to have increased HR yesterday afternoon (112) and was given metoprolol. Was held 3/14 AM, suspect due to hypotension. Holding off on TEE/cardioversion given patient reluctance.  - Cardiology consulted, appreciate their care of this patient - started PO amiodarone 200mg  BID per cards  - continue eliquis 5mg  BID per cards  - Continue home p.o. diltiazem 120mg  BID  - continue home metoprolol 50mg  BID

## 2022-06-25 NOTE — Assessment & Plan Note (Addendum)
On RA overnight. UOP 2.5L yesterday. Repeat CXR with decreased effusion, no acute cardiopulmonary process. - Cardiology consulted, appreciate their ongoing care - Held IV Lasix yesterday in the setting of hyponatremia  - Strict ins and outs  - Supplemental O2 as needed for SpO2 <92%

## 2022-06-26 ENCOUNTER — Encounter (HOSPITAL_COMMUNITY): Payer: Self-pay | Admitting: Family Medicine

## 2022-06-26 ENCOUNTER — Observation Stay (HOSPITAL_COMMUNITY): Payer: Medicare Other

## 2022-06-26 DIAGNOSIS — D72829 Elevated white blood cell count, unspecified: Secondary | ICD-10-CM | POA: Diagnosis present

## 2022-06-26 DIAGNOSIS — Z8249 Family history of ischemic heart disease and other diseases of the circulatory system: Secondary | ICD-10-CM | POA: Diagnosis not present

## 2022-06-26 DIAGNOSIS — J90 Pleural effusion, not elsewhere classified: Secondary | ICD-10-CM

## 2022-06-26 DIAGNOSIS — Z1152 Encounter for screening for COVID-19: Secondary | ICD-10-CM | POA: Diagnosis not present

## 2022-06-26 DIAGNOSIS — I5033 Acute on chronic diastolic (congestive) heart failure: Secondary | ICD-10-CM | POA: Diagnosis present

## 2022-06-26 DIAGNOSIS — Z7901 Long term (current) use of anticoagulants: Secondary | ICD-10-CM | POA: Diagnosis not present

## 2022-06-26 DIAGNOSIS — I951 Orthostatic hypotension: Secondary | ICD-10-CM | POA: Diagnosis present

## 2022-06-26 DIAGNOSIS — I444 Left anterior fascicular block: Secondary | ICD-10-CM | POA: Diagnosis present

## 2022-06-26 DIAGNOSIS — R54 Age-related physical debility: Secondary | ICD-10-CM | POA: Diagnosis present

## 2022-06-26 DIAGNOSIS — I5030 Unspecified diastolic (congestive) heart failure: Secondary | ICD-10-CM | POA: Diagnosis present

## 2022-06-26 DIAGNOSIS — Z803 Family history of malignant neoplasm of breast: Secondary | ICD-10-CM | POA: Diagnosis not present

## 2022-06-26 DIAGNOSIS — R0609 Other forms of dyspnea: Secondary | ICD-10-CM

## 2022-06-26 DIAGNOSIS — M1990 Unspecified osteoarthritis, unspecified site: Secondary | ICD-10-CM | POA: Diagnosis present

## 2022-06-26 DIAGNOSIS — F17210 Nicotine dependence, cigarettes, uncomplicated: Secondary | ICD-10-CM | POA: Diagnosis present

## 2022-06-26 DIAGNOSIS — E871 Hypo-osmolality and hyponatremia: Secondary | ICD-10-CM | POA: Diagnosis present

## 2022-06-26 DIAGNOSIS — Z79899 Other long term (current) drug therapy: Secondary | ICD-10-CM | POA: Diagnosis not present

## 2022-06-26 DIAGNOSIS — Z885 Allergy status to narcotic agent status: Secondary | ICD-10-CM | POA: Diagnosis not present

## 2022-06-26 DIAGNOSIS — Z96653 Presence of artificial knee joint, bilateral: Secondary | ICD-10-CM | POA: Diagnosis present

## 2022-06-26 DIAGNOSIS — I3139 Other pericardial effusion (noninflammatory): Secondary | ICD-10-CM | POA: Diagnosis present

## 2022-06-26 DIAGNOSIS — J9811 Atelectasis: Secondary | ICD-10-CM | POA: Diagnosis present

## 2022-06-26 DIAGNOSIS — I110 Hypertensive heart disease with heart failure: Secondary | ICD-10-CM | POA: Diagnosis present

## 2022-06-26 DIAGNOSIS — I4891 Unspecified atrial fibrillation: Secondary | ICD-10-CM | POA: Diagnosis not present

## 2022-06-26 DIAGNOSIS — I959 Hypotension, unspecified: Secondary | ICD-10-CM | POA: Diagnosis present

## 2022-06-26 DIAGNOSIS — I4819 Other persistent atrial fibrillation: Secondary | ICD-10-CM | POA: Diagnosis present

## 2022-06-26 LAB — BASIC METABOLIC PANEL
Anion gap: 12 (ref 5–15)
BUN: 14 mg/dL (ref 8–23)
CO2: 29 mmol/L (ref 22–32)
Calcium: 8.4 mg/dL — ABNORMAL LOW (ref 8.9–10.3)
Chloride: 91 mmol/L — ABNORMAL LOW (ref 98–111)
Creatinine, Ser: 0.88 mg/dL (ref 0.44–1.00)
GFR, Estimated: >60 mL/min (ref >60)
Glucose, Bld: 97 mg/dL (ref 70–99)
Potassium: 3.3 mmol/L — ABNORMAL LOW (ref 3.5–5.1)
Sodium: 132 mmol/L — ABNORMAL LOW (ref 135–145)

## 2022-06-26 LAB — CBC
HCT: 31.2 % — ABNORMAL LOW (ref 36.0–46.0)
Hemoglobin: 10.5 g/dL — ABNORMAL LOW (ref 12.0–15.0)
MCH: 29.9 pg (ref 26.0–34.0)
MCHC: 33.7 g/dL (ref 30.0–36.0)
MCV: 88.9 fL (ref 80.0–100.0)
Platelets: 390 10*3/uL (ref 150–400)
RBC: 3.51 MIL/uL — ABNORMAL LOW (ref 3.87–5.11)
RDW: 14.6 % (ref 11.5–15.5)
WBC: 14.9 10*3/uL — ABNORMAL HIGH (ref 4.0–10.5)
nRBC: 0 % (ref 0.0–0.2)

## 2022-06-26 LAB — ECHOCARDIOGRAM COMPLETE
Area-P 1/2: 3.93 cm2
Calc EF: 66.4 %
Height: 63 in
MV M vel: 4.88 m/s
MV Peak grad: 95.3 mmHg
Radius: 0.4 cm
S' Lateral: 2.7 cm
Single Plane A2C EF: 63.4 %
Single Plane A4C EF: 69.4 %
Weight: 2432 oz

## 2022-06-26 MED ORDER — POTASSIUM CHLORIDE CRYS ER 20 MEQ PO TBCR
40.0000 meq | EXTENDED_RELEASE_TABLET | Freq: Once | ORAL | Status: AC
  Administered 2022-06-26: 40 meq via ORAL
  Filled 2022-06-26: qty 2

## 2022-06-26 MED ORDER — EMPAGLIFLOZIN 10 MG PO TABS
10.0000 mg | ORAL_TABLET | Freq: Every day | ORAL | Status: DC
  Administered 2022-06-26 – 2022-06-29 (×4): 10 mg via ORAL
  Filled 2022-06-26 (×4): qty 1

## 2022-06-26 MED ORDER — AMIODARONE HCL 200 MG PO TABS
200.0000 mg | ORAL_TABLET | Freq: Two times a day (BID) | ORAL | Status: DC
  Administered 2022-06-26 – 2022-06-29 (×7): 200 mg via ORAL
  Filled 2022-06-26 (×7): qty 1

## 2022-06-26 NOTE — NC FL2 (Signed)
[place] LEVEL OF CARE FORM     IDENTIFICATION  Patient Name: Sheena Taylor Birthdate: 1934-10-31 Sex: female Admission Date (Current Location): 06/25/2022  [facility] and [place] Number:  Herbalist and Address:  [facility], [place] [address], [place], [place] [address]      Provider Number: [id number]  Attending Physician Name and Address:  Dickie La, MD  Relative Name and Phone Number:       Current Level of Care: Hospital Recommended Level of Care: [place] Prior Approval Number:    Date Approved/Denied:   PASRR Number: [id number]  Discharge Plan: SNF    Current Diagnoses: Patient Active Problem List   Diagnosis Date Noted   Leukocytosis 06/26/2022   Possible Heart failure with preserved ejection fraction ([place]) 06/26/2022   Hypotension 06/25/2022   Dyspnea on exertion 06/25/2022   Generalized weakness 06/25/2022   Pleural effusion, bilateral 06/25/2022   Acute idiopathic pericarditis 06/09/2022   Pleural effusion 06/09/2022   Hypokalemia 06/09/2022   Hyponatremia 06/09/2022   Atrial fibrillation with controlled ventricular rate ([place]) 06/09/2022   Pericardial effusion 06/03/2022   Cardiac tamponade 06/03/2022   Atrial fibrillation with RVR ([place]) 12/16/2018   Essential hypertension 12/16/2018    Orientation RESPIRATION BLADDER Height & Weight     Self, Time, Situation, Place  Normal Continent, External catheter Weight: 152 lb (68.9 kg) Height:  '5\' 3"'$  (160 cm)  BEHAVIORAL SYMPTOMS/MOOD NEUROLOGICAL BOWEL NUTRITION STATUS      Incontinent Diet (See dc summary)  AMBULATORY STATUS COMMUNICATION OF NEEDS Skin   Limited Assist Verbally Normal                       Personal Care Assistance Level of Assistance  Bathing, Feeding, Dressing Bathing Assistance: Limited assistance Feeding assistance: Independent Dressing Assistance: Limited assistance     Functional Limitations Info   Sight Sight Info: Impaired        SPECIAL CARE FACTORS FREQUENCY  PT (By licensed PT), OT (By licensed OT)     PT Frequency: 5x/week OT Frequency: 5x/week            Contractures Contractures Info: Not present    Additional Factors Info  Code Status, Allergies Code Status Info: Full Allergies Info: Codeine           Current Medications (06/26/2022):  This is the current hospital active medication list Current Facility-Administered Medications  Medication Dose Route Frequency Provider Last Rate Last Admin   amiodarone (PACERONE) tablet 200 mg  200 mg Oral BID Patwardhan, Manish J, MD   200 mg at 06/26/22 1535   apixaban (ELIQUIS) tablet 5 mg  5 mg Oral BID Ezequiel Essex, MD   5 mg at 06/26/22 1132   diltiazem (CARDIZEM SR) 12 hr capsule 120 mg  120 mg Oral BID Ezequiel Essex, MD   120 mg at 06/26/22 1132   empagliflozin (JARDIANCE) tablet 10 mg  10 mg Oral Daily Patwardhan, Manish J, MD   10 mg at 06/26/22 1422   furosemide (LASIX) injection 40 mg  40 mg Intravenous BID Ezequiel Essex, MD   40 mg at 06/26/22 0733   metoprolol tartrate (LOPRESSOR) tablet 50 mg  50 mg Oral BID Ezequiel Essex, MD   50 mg at 06/26/22 1132   pantoprazole (PROTONIX) EC tablet 40 mg  40 mg Oral q AM Ezequiel Essex, MD   40 mg at 06/26/22 0733   pravastatin (PRAVACHOL) tablet  20 mg  20 mg Oral QPM Ezequiel Essex, MD   20 mg at 06/25/22 1800     Discharge Medications: Please see discharge summary for a list of discharge medications.  Relevant Imaging Results:  Relevant Lab Results:   Additional Information SSN [id number]  Benard Halsted, LCSW

## 2022-06-26 NOTE — Progress Notes (Signed)
Subjective:  Breathing improved   Current Facility-Administered Medications:    amiodarone (NEXTERONE PREMIX) 360-4.14 MG/200ML-% (1.8 mg/mL) IV infusion, 30 mg/hr, Intravenous, Continuous, Ezequiel Essex, MD, Last Rate: 16.67 mL/hr at 06/26/22 1131, 30 mg/hr at 06/26/22 1131   apixaban (ELIQUIS) tablet 5 mg, 5 mg, Oral, BID, Ezequiel Essex, MD, 5 mg at 06/26/22 1132   diltiazem (CARDIZEM SR) 12 hr capsule 120 mg, 120 mg, Oral, BID, Ezequiel Essex, MD, 120 mg at 06/26/22 1132   empagliflozin (JARDIANCE) tablet 10 mg, 10 mg, Oral, Daily, Helia Haese J, MD, 10 mg at 06/26/22 1422   furosemide (LASIX) injection 40 mg, 40 mg, Intravenous, BID, Ezequiel Essex, MD, 40 mg at 06/26/22 0733   metoprolol tartrate (LOPRESSOR) tablet 50 mg, 50 mg, Oral, BID, Ezequiel Essex, MD, 50 mg at 06/26/22 1132   pantoprazole (PROTONIX) EC tablet 40 mg, 40 mg, Oral, q AM, Ezequiel Essex, MD, 40 mg at 06/26/22 0733   pravastatin (PRAVACHOL) tablet 20 mg, 20 mg, Oral, QPM, Ezequiel Essex, MD, 20 mg at 06/25/22 1800   Objective:  Vital Signs in the last 24 hours: Temp:  [97.7 F (36.5 C)-98.7 F (37.1 C)] 97.7 F (36.5 C) (03/12 0800) Pulse Rate:  [80-118] 97 (03/12 1300) Resp:  [17-34] 17 (03/12 1300) BP: (93-121)/(51-84) 111/73 (03/12 1300) SpO2:  [91 %-98 %] 93 % (03/12 1300)  Intake/Output from previous day: 03/11 0701 - 03/12 0700 In: 112.1 [I.V.:112.1] Out: 1700 [Urine:1700]  Physical Exam Vitals and nursing note reviewed.  Constitutional:      General: She is not in acute distress. Neck:     Vascular: No JVD.  Cardiovascular:     Rate and Rhythm: Normal rate. Rhythm irregular.     Heart sounds: Normal heart sounds. No murmur heard. Pulmonary:     Effort: Pulmonary effort is normal.     Breath sounds: Normal breath sounds. No wheezing or rales.  Musculoskeletal:     Right lower leg: Edema (Trace) present.     Left lower leg: Edema (Trace) present.      Imaging/tests  reviewed and independently interpreted:  CXR 06/26/2022: Personally reviewed and compared. Rt pleural effusion slightly improved than 06/25/2022.   Cardiac Studies:  Telemetry 06/26/2022: Afib with improved rate control  EKG 06/25/2022: Atrial fibrillation Left anterior fascicular block Borderline low voltage, extremity leads Nonspecific repol abnormality, lateral leads Prolonged QT interval Confirmed  Echocardiogram 06/26/2022: 1. Left ventricular ejection fraction, by estimation, is 65 to 70%. The left ventricle has normal function. The left ventricle has no regional wall motion abnormalities. Left ventricular diastolic parameters are  indeterminate. There is the interventricular septum is flattened in diastole ('D' shaped left ventricle), consistent with right ventricular volume overload.  2. Right ventricular systolic function is normal. The right ventricular  size is is dilated. There is mildly elevated pulmonary artery systolic pressure. The estimated right ventricular systolic pressure is 0000000 mmHg.  3. Left atrial size was severely dilated.  4. Right atrial size was mildly dilated.  5. A small pericardial effusion is present. The pericardial effusion is circumferential and posterior to the left ventricle.  6. The mitral valve is normal in structure. Mild mitral valve regurgitation. No evidence of mitral stenosis.  7. Eccentric tricuspid regurgitation. Tricuspid valve regurgitation is mild to moderate.   8. The aortic valve is tricuspid. There is mild calcification of the aortic valve. There is mild thickening of the aortic valve. Aortic valve  regurgitation is not visualized. No aortic stenosis is present.  9.  The inferior vena cava is dilated in size with <50% respiratory variability, suggesting right atrial pressure of 15 mmHg.   Comparison(s): Prior images reviewed side by side. Mitral and tricuspid regurgitation have improved; RVSP and pericardial effusion are similar.      Assessment & Recommendations:  88 year old Caucasian female with hypertension, arthritis, paroxysmal atrial fibrillation, cardiac tamponade requiring emergency pericardiocentesis in 05/2022, bilateral pleural effusions requiring Rt sided thoracentesis in 05/2022, admitted back with worsening shortness of breath.   A-fib with RVR: Rate well controlled on PO metoprolol, diltiazem, and IV amiodarone.  Change amiodarone IV to PO 200 mg bid. CHA2DS2VASc score at least 4, annual stroke risk 5%.  Continue Eliquis 5 mg twice daily. I discussed with her re: TEE cardioversion at some point, but she is reluctant at this time. Given that rate is controlled, okay to hold TEE/cardioversion for now.    HFpEF: Acute on chronic exacerbation. Improving with diuresis. Continue IV lasix 40 mg bid. Added Jardiance 10 mg daily. May add spironolactone tomorrow.  Pleural effusion: B/l R>L. Improving with diuresis.   Pericardial effusion: S/p pericardiocentesis for tamponade (05/2022). Small residual effusion, stable.  Hyponatremia: Dilutional. Improving with diuresis.   We will continue to follow.   Discussed interpretation of tests and management recommendations with the primary team   Nigel Mormon, MD Pager: [phone] Office: [phone]

## 2022-06-26 NOTE — Progress Notes (Addendum)
Daily Progress Note Intern Pager: [phone]  Patient name: Sheena Taylor Medical record number: [id number] Date of birth: 1934/05/03 Age: 88 y.o. Gender: female  Primary Care Provider: Garwin Brothers, MD Consultants: cardiology Code Status: FULL  Pt Overview and Major Events to Date:  3/12 - admitted   Assessment and Plan: Sheena Taylor is a 88 y.o. female presenting with shortness of breath and fatigue for the last 3 days due to recurrent pleural effusion thought to be due to interplay between Afib RVR and possible underlying HFpEF.   Pertinent PMH/PSH includes HTN, paroxysmal Afib, cardiac tamponade requiring emergency pericardiocentesis 05/2022, bilateral pleural effusion requiring Rt sided thoracentesis 05/2022.    * Pleural effusion Likely cause of shortness of breath, reaccumulation of fluid seen on CXR in ED. Cardiology suspect interplay between Afib RVR and underlying HFpEF. SpO2 stable on room air overnight. UOP 1.7L yesterday. - Cardiology consulted, appreciate their ongoing care - IV Lasix 40 mg twice daily - Strict ins and outs  - Supplemental O2 as needed for SpO2 <92% - AM CXR 3/13  Pericardial effusion Trace pericardial effusion seen on bedside ultrasound in ED.  Given previous recent pericardial effusion with tamponade requiring emergent pericardiocentesis and the fact that patient is now back on Eliquis, will obtain complete echo for surveillance.  -echo pending  Atrial fibrillation with RVR (HCC) In Afib with RVR on admission. Cardiology believes she is too medically frail for cardioversion at this time and patient declines. TSH wnl. HR decreased overnight.  - Cardiology consulted, appreciate their care of this patient - Started on IV amiodarone 30 mg/h per cardiology, hold oral  - Restarted eliquis '5mg'$  BID per cards  - Continue home p.o. diltiazem '120mg'$  BID  - Restarted home metoprolol '50mg'$  BID   Hypotension Soft Bps overnight.  - continue to hold  home HCTZ, lisinopril -Trend BMP, consider adding back lisinopril first if needed  Leukocytosis Persistent leukocytosis. Last pleural sample with albumin <1.5, protein <3, glucose 114, suspected transudative. No growth on culture. No cell count or cytology. Pericardial effusion without malignant cells. Wonder if malignancy may be causing recurrent pleural effusions.  -order cell count and cytology if she requires repeat thoracentesis   FEN/GI: regular  PPx: on eliquis  Dispo: back to facility pending clinical improvement   Subjective:  NAEO. Patient seen lying in bed with occasional coughing. Patient reports mild SOB. She denies any chest pain. Would prefer not to undergo thoracentesis but willing if she has to. Felt like cardiocentesis last time was worse than the thoracentesis. Reports 2lb weight loss with these past hospitalizations.   Objective: Temp:  [97.7 F (36.5 C)-98.7 F (37.1 C)] 97.7 F (36.5 C) (03/12 0800) Pulse Rate:  [80-118] 99 (03/12 0800) Resp:  [20-34] 24 (03/12 0800) BP: (93-121)/(51-84) 121/54 (03/12 0800) SpO2:  [91 %-98 %] 94 % (03/12 0800)  Physical Exam: General: in no acute distress, normal WOB on RA.  Cardiovascular: irregular rhythm. Regular rate. No m/r/g. Respiratory: crackles in bilateral lung bases.  Abdomen: soft, non-tender, non-distended.  Extremities: minimal peripheral edema. Peripheral pulses intact.   Laboratory: Most recent CBC Lab Results  Component Value Date   WBC 14.9 (H) 06/26/2022   HGB 10.5 (L) 06/26/2022   HCT 31.2 (L) 06/26/2022   MCV 88.9 06/26/2022   PLT 390 06/26/2022   Most recent BMP    Latest Ref Rng & Units 06/26/2022    3:25 AM  BMP  Glucose 70 - 99 mg/dL 97  BUN 8 - 23 mg/dL 14   Creatinine 0.44 - 1.00 mg/dL 0.88   Sodium 135 - 145 mmol/L 132   Potassium 3.5 - 5.1 mmol/L 3.3   Chloride 98 - 111 mmol/L 91   CO2 22 - 32 mmol/L 29   Calcium 8.9 - 10.3 mg/dL 8.4    Bcx NG < 12 hrs  TSH wnl A1c pending   Troponin 5>4  LA 2>1.7 BNP 297.2  [name], MD 06/26/2022, 12:01 PM  PGY-1, [place] Intern pager: [phone], text pages welcome Secure chat group [place]

## 2022-06-26 NOTE — Progress Notes (Signed)
Heart Failure Navigator Progress Note  Assessed for Heart & Vascular TOC clinic readiness.  Patient does not meet criteria due to [facility] patient.   Navigator will sign off at this time.    George Alcantar, BSN, RN Heart Failure Nurse Navigator Secure Chat Only   

## 2022-06-26 NOTE — Plan of Care (Signed)

## 2022-06-26 NOTE — TOC Initial Note (Signed)
Transition of Care [facility]) - Initial/Assessment Note    Patient Details  Name: Sheena Taylor MRN: [id number] Date of Birth: [birth year]  [facility] of [facility] CM/SW Contact:    Benard Halsted, LCSW Phone Number: 06/26/2022, 3:38 PM  Clinical Narrative:                 CSW spoke with patient and spouse at bedside regarding discharge plan. Patient reported she would like to return to [facility] for rehab and she is paying to hold her bed. Per [employer], patient will need insurance authorization for return. Patient requesting PTAR at discharge. CSW went over potential ambulance copays.   Expected Discharge Plan: Skilled Nursing Facility Barriers to Discharge: Continued Medical Work up, Ship broker   Patient Goals and CMS Choice Patient states their goals for this hospitalization and ongoing recovery are:: Rehab CMS Medicare.gov Compare Post Acute Care list provided to:: Patient Choice offered to / list presented to : Patient, [place] ownership interest in [facility].provided to:: Patient    Expected Discharge Plan and Services In-house Referral: Clinical Social Work   Post Acute Care Choice: [place] Living arrangements for the past 2 months: [place], [place]                                      Prior Living Arrangements/Services Living arrangements for the past 2 months: [place], [place] Lives with:: Spouse Patient language and need for interpreter reviewed:: Yes Do you feel safe going back to the place where you live?: Yes      Need for Family Participation in Patient Care: Yes (Comment) Care giver support system in place?: Yes (comment)   Criminal Activity/Legal Involvement Pertinent to Current Situation/Hospitalization: No - Comment as needed  Activities of Daily Living      Permission Sought/Granted Permission sought to share  information with : Facility Sport and exercise psychologist, Family Supports Permission granted to share information with : Yes, Verbal Permission Granted     Permission granted to share info w AGENCY: [place] granted to share info w Relationship: Spouse     Emotional Assessment Appearance:: Appears stated age Attitude/Demeanor/Rapport: Engaged Affect (typically observed): Accepting, Appropriate Orientation: : Oriented to Self, Oriented to Place, Oriented to Situation, Oriented to  Time Alcohol / Substance Use: Not Applicable Psych Involvement: No (comment)  Admission diagnosis:  Pleural effusion, bilateral [J90] Generalized weakness [R53.1] Atrial fibrillation with RVR (HCC) [I48.91] Hypotension [I95.9] Patient Active Problem List   Diagnosis Date Noted   Leukocytosis 06/26/2022   Possible Heart failure with preserved ejection fraction (HCC) 06/26/2022   Hypotension 06/25/2022   Dyspnea on exertion 06/25/2022   Generalized weakness 06/25/2022   Pleural effusion, bilateral 06/25/2022   Acute idiopathic pericarditis 06/09/2022   Pleural effusion 06/09/2022   Hypokalemia 06/09/2022   Hyponatremia 06/09/2022   Atrial fibrillation with controlled ventricular rate (HCC) 06/09/2022   Pericardial effusion 06/03/2022   Cardiac tamponade 06/03/2022   Atrial fibrillation with RVR ([place]) 12/16/2018   Essential hypertension 12/16/2018   PCP:  Garwin Brothers, MD Pharmacy:  No Pharmacies Listed    Social Determinants of Health (SDOH) Social History: SDOH Screenings   Food Insecurity: No Food Insecurity (06/03/2022)  Housing: Low Risk  (06/03/2022)  Transportation Needs: No Transportation Needs (06/03/2022)  Utilities: Not At Risk (06/03/2022)  Tobacco Use: High Risk (06/26/2022)  SDOH Interventions:     Readmission Risk Interventions     No data to display

## 2022-06-26 NOTE — Progress Notes (Signed)
Echocardiogram 2D Echocardiogram has been performed.  Sheena Taylor 06/26/2022, 10:27 AM

## 2022-06-26 NOTE — Hospital Course (Addendum)
Sheena Taylor is a 88 y.o. female with a PMHx of HTN, paroxysmal Afib, cardiac tamponade requiring emergency pericardiocentesis 05/2022, bilateral pleural effusion requiring Rt sided thoracentesis 05/2022 presenting with shortness of breath and fatigue for the last 3 days due to recurrent pleural effusion thought to be due to interplay between Afib RVR and possibly underlying HFpEF.   Afib with RVR HFpEF Cardiology followed throughout her admission. RVR thought to be driven by worsening pleural effusions and she was not thought to be hypertensive enough for emergency cardioversion and she was also thought to be at elevated risk of stroke given recent interruption in anticoagulation. She was not thought to be stable enough to perform TEE cardioversion on admission given her respiratory status and pleural effusions and she was started on IV amiodarone for Afib and continued on her eliquis, home metoprolol and home diltiazem. Rate became controlled. She was transitioned to PO amiodarone prior to discharge. Echo showed LVEF 65-70%, right ventricular volume overload (flattened IV septum in diastole). Considered TEE guided cardioversion however patient was reluctant and no emergent need at this time given rate controlled. She was started on jardiance 10 daily. She had elevated HR (110s-120s) on 3/14 and was given metoprolol. Her HR decreased at time of discharge. She was net -7.4L for her hospital stay. Her metoprolol was also decreased at time of discharge.   Pleural effusion Likely cause of shortness of breath, reaccumulation of fluid seen on CXR in ED. Cardiology suspect interplay between Afib RVR and underlying HFpEF. SpO2 stable on room air at time of discharge. She was started on IV lasix 40 BID with good urine output. Repeat CXRs during admission with decreased effusions noted.   Pericardial effusion Trace pericardial effusion seen on bedside ultrasound in ED. Given previous recent pericardial  effusion with tamponade requiring emergent pericardiocentesis and the fact that patient is now back on Eliquis, obtained complete echo for surveillance which showed small pericardial effusion which cardiology felt was stable.    Leukocytosis Persistent leukocytosis. Last pleural sample with albumin <1.5, protein <3, glucose 114, suspected transudative. No growth on culture. No cell count or cytology. Pericardial effusion without malignant cells. Wonder if malignancy may be causing recurrent pleural effusions. Did not require repeat thoracentesis but would consider cell count and cytology if she requires thoracentesis at a later date.   Hyponatremia  She was hyponatremic on admission that was thought to be dilutional and improved with diuresis but continued to be hyponatremic in spite of diuresis which was thought to be depletional. IV lasix was held 3/14. Her hyponatremia was stable at time of discharge   Items for PCP follow-up Follow up leukocytosis (WBC), consider hem/onc referral Cardiology follow-up to consider TEE/cardioversion and adding spironolactone Follow up hyponatremia  Monitor blood pressure, discontinued HCTZ and lisinopril due to hypotension during admission, cardiology decreased metoprolol prior to discharge, okay to take if SBP>90

## 2022-06-26 NOTE — Assessment & Plan Note (Addendum)
Leukocytosis continues to downtrend. Last pleural sample with albumin <1.5, protein <3, glucose 114, suspected transudative. No growth on culture. No cell count or cytology. Pericardial effusion without malignant cells. Wonder if malignancy may be causing recurrent pleural effusions.  -order cell count and cytology if she requires repeat thoracentesis

## 2022-06-27 ENCOUNTER — Other Ambulatory Visit (HOSPITAL_COMMUNITY): Payer: Self-pay

## 2022-06-27 ENCOUNTER — Inpatient Hospital Stay (HOSPITAL_COMMUNITY): Payer: Medicare Other

## 2022-06-27 DIAGNOSIS — I4811 Longstanding persistent atrial fibrillation: Secondary | ICD-10-CM

## 2022-06-27 DIAGNOSIS — I5033 Acute on chronic diastolic (congestive) heart failure: Secondary | ICD-10-CM | POA: Insufficient documentation

## 2022-06-27 DIAGNOSIS — I4819 Other persistent atrial fibrillation: Secondary | ICD-10-CM

## 2022-06-27 DIAGNOSIS — I3139 Other pericardial effusion (noninflammatory): Secondary | ICD-10-CM

## 2022-06-27 LAB — BASIC METABOLIC PANEL
Anion gap: 12 (ref 5–15)
BUN: 12 mg/dL (ref 8–23)
CO2: 31 mmol/L (ref 22–32)
Calcium: 8.7 mg/dL — ABNORMAL LOW (ref 8.9–10.3)
Chloride: 89 mmol/L — ABNORMAL LOW (ref 98–111)
Creatinine, Ser: 0.99 mg/dL (ref 0.44–1.00)
GFR, Estimated: 56 mL/min — ABNORMAL LOW (ref >60)
Glucose, Bld: 108 mg/dL — ABNORMAL HIGH (ref 70–99)
Potassium: 3.5 mmol/L (ref 3.5–5.1)
Sodium: 132 mmol/L — ABNORMAL LOW (ref 135–145)

## 2022-06-27 LAB — CBC
HCT: 31.6 % — ABNORMAL LOW (ref 36.0–46.0)
Hemoglobin: 10.6 g/dL — ABNORMAL LOW (ref 12.0–15.0)
MCH: 29.4 pg (ref 26.0–34.0)
MCHC: 33.5 g/dL (ref 30.0–36.0)
MCV: 87.8 fL (ref 80.0–100.0)
Platelets: 416 10*3/uL — ABNORMAL HIGH (ref 150–400)
RBC: 3.6 MIL/uL — ABNORMAL LOW (ref 3.87–5.11)
RDW: 14.5 % (ref 11.5–15.5)
WBC: 11.9 10*3/uL — ABNORMAL HIGH (ref 4.0–10.5)
nRBC: 0 % (ref 0.0–0.2)

## 2022-06-27 LAB — HEMOGLOBIN A1C
Hgb A1c MFr Bld: 5.9 % — ABNORMAL HIGH (ref 4.8–5.6)
Mean Plasma Glucose: 123 mg/dL

## 2022-06-27 MED ORDER — ACETAMINOPHEN 325 MG PO TABS
650.0000 mg | ORAL_TABLET | Freq: Four times a day (QID) | ORAL | Status: DC | PRN
  Administered 2022-06-27: 650 mg via ORAL
  Filled 2022-06-27: qty 2

## 2022-06-27 MED ORDER — LIDOCAINE 5 % EX PTCH
1.0000 | MEDICATED_PATCH | Freq: Every day | CUTANEOUS | Status: DC
  Administered 2022-06-27 – 2022-06-29 (×2): 1 via TRANSDERMAL
  Filled 2022-06-27 (×2): qty 1

## 2022-06-27 NOTE — Progress Notes (Signed)
Daily Progress Note Intern Pager: [phone]  Patient name: Sheena Taylor Medical record number: [id number] Date of birth: [birth year] Age: 88 y.o. Gender: female  Primary Care Provider: Garwin Brothers, MD Consultants: cardiology Code Status: FULL  Pt Overview and Major Events to Date:  3/12 - admitted    Assessment and Plan: Sheena Taylor is a 88 y.o. female presenting with shortness of breath and fatigue for the last 3 days due to recurrent pleural effusion thought to be due to interplay between Afib RVR and possible underlying HFpEF.    Pertinent PMH/PSH includes HTN, paroxysmal Afib, cardiac tamponade requiring emergency pericardiocentesis 05/2022, bilateral pleural effusion requiring Rt sided thoracentesis 05/2022.   * Pleural effusion Likely cause of shortness of breath, reaccumulation of fluid seen on CXR in ED. Repeat CXR with trace R pleural effusion. SpO2 stable on room air overnight. UOP 2.2L yesterday.  - Cardiology consulted, appreciate their ongoing care - Continue IV Lasix 40 mg twice daily - Strict ins and outs  - Supplemental O2 as needed for SpO2 <92%  Pericardial effusion Repeat echo with small pericardial effusion -stable per cardiology  Atrial fibrillation with RVR (Sheena Taylor) Now rate controlled and holding off on TEE/cardioversion given patient reluctance.  - Cardiology consulted, appreciate their care of this patient - started PO amiodarone '200mg'$  BID per cards  - continue eliquis '5mg'$  BID per cards  - Continue home p.o. diltiazem '120mg'$  BID  - continue home metoprolol '50mg'$  BID   Hypotension Soft Bps overnight.  - continue to hold home HCTZ, lisinopril -Trend BMP, consider adding back lisinopril first if needed  Acute on chronic heart failure with preserved ejection fraction (HFpEF) (Sheena Taylor) Echo with LVEF 65-70%. RV overload, dilated LV. Cardiology suspect interplay between Afib RVR and underlying HFpEF as cause of her SOB and recurrent pleural effusions.   -continue IV lasix  -added jardiance '10mg'$  daily   Leukocytosis Leukocytosis downtrending. Last pleural sample with albumin <1.5, protein <3, glucose 114, suspected transudative. No growth on culture. No cell count or cytology. Pericardial effusion without malignant cells. Wonder if malignancy may be causing recurrent pleural effusions.  -order cell count and cytology if she requires repeat thoracentesis   FEN/GI: regular PPx: on eliquis Dispo: likely back to SNF rehab  Subjective:  NAEO. Patient seen sitting up in bed. She denies any chest pain, reports SOB has improved. No issues with urination or bowel movements. Wondering when she can stop the IV lasix.   Objective: Temp:  [97.9 F (36.6 C)-98.1 F (36.7 C)] 97.9 F (36.6 C) (03/13 0800) Pulse Rate:  [79-104] 84 (03/13 1029) Resp:  [17-26] 19 (03/13 1029) BP: (103-120)/(54-73) 103/54 (03/13 1018) SpO2:  [91 %-95 %] 94 % (03/13 1029)  Physical Exam: General: alert, in no acute distress Cardiovascular: irregular rhythm. Regular rate. No m/r/g. Respiratory: CTAB. Normal WOB on RA. Abdomen: soft, non-tender, non-distended  Extremities: 1+ edema BLE.   Laboratory: Most recent CBC Lab Results  Component Value Date   WBC 11.9 (H) 06/27/2022   HGB 10.6 (L) 06/27/2022   HCT 31.6 (L) 06/27/2022   MCV 87.8 06/27/2022   PLT 416 (H) 06/27/2022   Most recent BMP    Latest Ref Rng & Units 06/27/2022    3:12 AM  BMP  Glucose 70 - 99 mg/dL 108   BUN 8 - 23 mg/dL 12   Creatinine 0.44 - 1.00 mg/dL 0.99   Sodium 135 - 145 mmol/L 132   Potassium 3.5 - 5.1 mmol/L 3.5  Chloride 98 - 111 mmol/L 89   CO2 22 - 32 mmol/L 31   Calcium 8.9 - 10.3 mg/dL 8.7     Imaging/Diagnostic Tests: DG Chest 2 View Result Date: 06/27/2022 CLINICAL DATA:  8 female with history of pleural effusion. EXAM: CHEST - 2 VIEW COMPARISON: IMPRESSION: 1. Trace right pleural effusion. 2. Hazy bibasilar opacities, likely atelectasis. 3. Unchanged  cardiomegaly. Electronically Signed   By: Ruthann Cancer M.D.   On: 06/27/2022 08:16   Result Date: 06/26/2022    ECHOCARDIOGRAM REPORT   Patient Name:   Sheena Taylor Date of Exam: 06/26/2022 FINDINGS  Left Ventricle: Left ventricular ejection fraction, by estimation, is 65 to 70%. The left ventricle has normal function. The left ventricle has no regional wall motion abnormalities. The left ventricular internal cavity size was normal in size. There is  no left ventricular hypertrophy. The interventricular septum is flattened in diastole ('D' shaped left ventricle), consistent with right ventricular volume overload. Left ventricular diastolic parameters are indeterminate. Right Ventricle: The right ventricular size is is dilated. No increase in right ventricular wall thickness. Right ventricular systolic function is normal. There is mildly elevated pulmonary artery systolic pressure. The tricuspid regurgitant velocity is 2.66 m/s, and with an assumed right atrial pressure of 15 mmHg, the estimated right ventricular systolic pressure is 0000000 mmHg. Left Atrium: Left atrial size was severely dilated. Right Atrium: Right atrial size was mildly dilated. Pericardium: A small pericardial effusion is present. The pericardial effusion is circumferential and posterior to the left ventricle. Mitral Valve: The mitral valve is normal in structure. Mild mitral valve regurgitation. No evidence of mitral valve stenosis. Tricuspid Valve: Eccentric tricuspid regurgitation. The tricuspid valve is normal in structure. Tricuspid valve regurgitation is mild to moderate. Aortic Valve: The aortic valve is tricuspid. There is mild calcification of the aortic valve. There is mild thickening of the aortic valve. Aortic valve regurgitation is not visualized. No aortic stenosis is present. Pulmonic Valve: The pulmonic valve was not well visualized. Pulmonic valve regurgitation is not visualized. No evidence of pulmonic stenosis. Aorta:  The aortic root and ascending aorta are structurally normal, with no evidence of dilitation. Venous: The inferior vena cava is dilated in size with less than 50% respiratory variability, suggesting right atrial pressure of 15 mmHg. IAS/Shunts: The atrial septum is grossly normal. Electronically signed by Rudean Haskell MD Signature Date/Time: 06/26/2022/10:49:41 AM    Final      [name], MD 06/27/2022, 11:04 AM  PGY-1, [place] Intern pager: [phone], text pages welcome Secure chat group [place]

## 2022-06-27 NOTE — Evaluation (Signed)
Occupational Therapy Evaluation Patient Details Name: Sheena Taylor MRN: [id number] DOB: [birth year] Today's Date: 06/27/2022   History of Present Illness 88 yo female admitted 3/11 from [facility] where she was receiving rehab with generalized weakness, hypotension and pericardial effusion.  PMHx; Afib, arthritis, HTN, bil TKA, recent admission for afib with RVR and pericardial effusion and cardiac tamponade, underwent  pericardiocentesis.   Clinical Impression   Prior to admission in February, pt was independent and active in her retirement community. She has been participating in rehab in SNF since and ambulating with a RW and completing basic ADLs. She was preparing to return to ILF when she was readmitted. Pt presents with L shoulder area pain, generalized weakness, decreased activity tolerance and impaired standing balance. She needs up to mod assist for bed mobility and min assist for OOB wit RW. She needs set up to max assist for ADLs. Recommend pt return to SNF for further rehab.      Recommendations for follow up therapy are one component of a multi-disciplinary discharge planning process, led by the attending physician.  Recommendations may be updated based on patient status, additional functional criteria and insurance authorization.   Follow Up Recommendations  Skilled nursing-short term rehab (<3 hours/day)     Assistance Recommended at Discharge Frequent or constant Supervision/Assistance  Patient can return home with the following Assist for transportation;Assistance with cooking/housework;A lot of help with bathing/dressing/bathroom;A little help with walking and/or transfers    Functional Status Assessment  Patient has had a recent decline in their functional status and demonstrates the ability to make significant improvements in function in a reasonable and predictable amount of time.  Equipment Recommendations  None recommended by OT    Recommendations for  Other Services       Precautions / Restrictions Precautions Precautions: Fall Precaution Comments: watch BP      Mobility Bed Mobility Overal bed mobility: Needs Assistance Bed Mobility: Supine to Sit     Supine to sit: Mod assist     General bed mobility comments: mod assist with use of bed pad for hips to EOB    Transfers Overall transfer level: Needs assistance Equipment used: Rolling walker (2 wheels) Transfers: Sit to/from Stand Sit to Stand: Min assist           General transfer comment: cues for hand placement, assist to rise and steady      Balance Overall balance assessment: Needs assistance   Sitting balance-Leahy Scale: Fair     Standing balance support: Bilateral upper extremity supported, Reliant on assistive device for balance Standing balance-Leahy Scale: Poor                             ADL either performed or assessed with clinical judgement   ADL Overall ADL's : Needs assistance/impaired Eating/Feeding: Independent;Sitting   Grooming: Wash/dry hands;Wash/dry face;Oral care;Brushing hair;Sitting;Set up   Upper Body Bathing: Minimal assistance;Sitting   Lower Body Bathing: Maximal assistance;Sit to/from stand   Upper Body Dressing : Minimal assistance;Sitting   Lower Body Dressing: Maximal assistance;Sit to/from stand   Toilet Transfer: Minimal assistance;Rolling walker (2 wheels)           Functional mobility during ADLs: Minimal assistance;Rolling walker (2 wheels)       Vision Baseline Vision/History: 1 Wears glasses Ability to See in Adequate Light: 0 Adequate Patient Visual Report: No change from baseline       Perception  Praxis      Pertinent Vitals/Pain Pain Assessment Pain Assessment: Faces Faces Pain Scale: Hurts little more Pain Location: L shoulder area Pain Descriptors / Indicators: Grimacing, Discomfort Pain Intervention(s): Monitored during session, Repositioned     Hand Dominance  Right   Extremity/Trunk Assessment Upper Extremity Assessment Upper Extremity Assessment: Generalized weakness   Lower Extremity Assessment Lower Extremity Assessment: Defer to PT evaluation   Cervical / Trunk Assessment Cervical / Trunk Assessment: Kyphotic   Communication Communication Communication: No difficulties   Cognition Arousal/Alertness: Awake/alert Behavior During Therapy: WFL for tasks assessed/performed, Flat affect Overall Cognitive Status: Within Functional Limits for tasks assessed                                       General Comments       Exercises     Shoulder Instructions      Home Living Family/patient expects to be discharged to:: Skilled nursing facility Living Arrangements: Spouse/significant other Available Help at Discharge: Family;Available 24 hours/day Type of Home: Independent living facility Home Access: Level entry;Elevator     Home Layout: One level     Bathroom Shower/Tub: Occupational psychologist: Handicapped height   How Accessible: Accessible via walker;Accessible via wheelchair Home Equipment: Grab bars - tub/shower   Additional Comments: Pt was ambulating with a RW at [facility] and completing basic ADLs, she was working towards returning home. At her baseline, pt is active and walks long distances to participate in volunteer and committee work in her community.      Prior Functioning/Environment Prior Level of Function : Needs assist             Mobility Comments: walking with RW at SNF ADLs Comments: completing basic ADLs, dependent in IADLs        OT Problem List: Decreased strength;Decreased activity tolerance;Impaired balance (sitting and/or standing);Decreased knowledge of use of DME or AE;Pain      OT Treatment/Interventions: Self-care/ADL training;Therapeutic activities;Patient/family education;Balance training;DME and/or AE instruction    OT Goals(Current goals can be found in  the care plan section) Acute Rehab OT Goals OT Goal Formulation: With patient Time For Goal Achievement: 07/11/22 Potential to Achieve Goals: Good ADL Goals Pt Will Perform Grooming: with supervision;standing Pt Will Perform Lower Body Bathing: with supervision;sit to/from stand Pt Will Perform Lower Body Dressing: with supervision;sit to/from stand Pt Will Transfer to Toilet: with supervision;bedside commode;ambulating Pt Will Perform Toileting - Clothing Manipulation and hygiene: with supervision;sit to/from stand Additional ADL Goal #1: Pt will complete bed mobility modified independently.  OT Frequency: Min 2X/week    Co-evaluation              AM-PAC OT "6 Clicks" Daily Activity     Outcome Measure Help from another person eating meals?: None Help from another person taking care of personal grooming?: A Little Help from another person toileting, which includes using toliet, bedpan, or urinal?: A Little Help from another person bathing (including washing, rinsing, drying)?: A Lot Help from another person to put on and taking off regular upper body clothing?: A Little Help from another person to put on and taking off regular lower body clothing?: A Lot 6 Click Score: 17   End of Session Equipment Utilized During Treatment: Rolling walker (2 wheels);Gait belt  Activity Tolerance: Patient tolerated treatment well Patient left: in chair;with call bell/phone within reach  OT Visit Diagnosis: Unsteadiness  on feet (R26.81);Other abnormalities of gait and mobility (R26.89);Pain;Muscle weakness (generalized) (M62.81)                Time: 1003-1030 OT Time Calculation (min): 27 min Charges:  OT General Charges $OT Visit: 1 Visit OT Evaluation $OT Eval Moderate Complexity: [place], OTR/L Acute Rehabilitation Services Office: [phone]   Malka So 06/27/2022, 10:52 AM

## 2022-06-27 NOTE — TOC Benefit Eligibility Note (Signed)
Patient Advocate Encounter  Insurance verification completed.    The patient is currently admitted and upon discharge could be taking Jardiance 10 mg.  The current 30 day co-pay is $47.00.   The patient is insured through [employer] Medicare Part D   Sharlena Kristensen, CPHT Pharmacy Patient Advocate Specialist [facility] Patient Advocate Team Direct Number: [phone]  Fax: [phone]       

## 2022-06-27 NOTE — Progress Notes (Signed)
Subjective:  Sitting upright. Accompanied by her husband and family. Breathing improved. Diuresing well Not on nasal cannula oxygen. Some left lateral chest wall pain with coughing   Current Facility-Administered Medications:    amiodarone (PACERONE) tablet 200 mg, 200 mg, Oral, BID, Patwardhan, Manish J, MD, 200 mg at 06/27/22 0844   apixaban (ELIQUIS) tablet 5 mg, 5 mg, Oral, BID, Ezequiel Essex, MD, 5 mg at 06/27/22 0843   diltiazem (CARDIZEM SR) 12 hr capsule 120 mg, 120 mg, Oral, BID, Ezequiel Essex, MD, 120 mg at 06/27/22 0843   empagliflozin (JARDIANCE) tablet 10 mg, 10 mg, Oral, Daily, Patwardhan, Manish J, MD, 10 mg at 06/27/22 0843   furosemide (LASIX) injection 40 mg, 40 mg, Intravenous, BID, Ezequiel Essex, MD, 40 mg at 06/27/22 0843   lidocaine (LIDODERM) 5 % 1 patch, 1 patch, Transdermal, Daily, Colletta Maryland, MD   metoprolol tartrate (LOPRESSOR) tablet 50 mg, 50 mg, Oral, BID, Ezequiel Essex, MD, 50 mg at 06/27/22 0844   pantoprazole (PROTONIX) EC tablet 40 mg, 40 mg, Oral, q AM, Ezequiel Essex, MD, 40 mg at 06/27/22 0844   pravastatin (PRAVACHOL) tablet 20 mg, 20 mg, Oral, QPM, Ezequiel Essex, MD, 20 mg at 06/26/22 1832   Objective:  Vital Signs in the last 24 hours: Temp:  [97.9 F (36.6 C)-98.1 F (36.7 C)] 97.9 F (36.6 C) (03/13 0800) Pulse Rate:  [70-104] 70 (03/13 1141) Resp:  [17-26] 20 (03/13 1141) BP: (103-120)/(54-73) 103/54 (03/13 1018) SpO2:  [91 %-95 %] 92 % (03/13 1141)  Intake/Output from previous day: 03/12 0701 - 03/13 0700 In: -  Out: 2250 [Urine:2250]  Net IO Since Admission: -3,837.89 mL [06/27/22 1228]  Physical Exam Vitals and nursing note reviewed.  Constitutional:      General: She is not in acute distress. Neck:     Vascular: No JVD.  Cardiovascular:     Rate and Rhythm: Normal rate. Rhythm irregular.     Heart sounds: Normal heart sounds. No murmur heard. Pulmonary:     Breath sounds: No wheezing or rales.      Comments: Poor respiratory effort.  Decreased breath sounds at the bases. Musculoskeletal:        General: No swelling or tenderness.    Imaging/tests reviewed and independently interpreted:  CXR 06/26/2022: Personally reviewed and compared. Rt pleural effusion slightly improved than 06/25/2022.   Cardiac Studies:  Telemetry 06/26/2022: Afib with improved rate control  EKG 06/25/2022: Atrial fibrillation Left anterior fascicular block Borderline low voltage, extremity leads Nonspecific repol abnormality, lateral leads Prolonged QT interval Confirmed  LABORATORY DATA:    Latest Ref Rng & Units 06/27/2022    3:12 AM 06/26/2022    3:25 AM 06/25/2022    7:37 PM  CBC  WBC 4.0 - 10.5 K/uL 11.9  14.9  18.7   Hemoglobin 12.0 - 15.0 g/dL 10.6  10.5  10.7   Hematocrit 36.0 - 46.0 % 31.6  31.2  32.0   Platelets 150 - 400 K/uL 416  390  395        Latest Ref Rng & Units 06/27/2022    3:12 AM 06/26/2022    3:25 AM 06/25/2022    9:36 AM  CMP  Glucose 70 - 99 mg/dL 108  97  124   BUN 8 - 23 mg/dL '12  14  18   '$ Creatinine 0.44 - 1.00 mg/dL 0.99  0.88  0.70   Sodium 135 - 145 mmol/L 132  132  127   Potassium 3.5 -  5.1 mmol/L 3.5  3.3  3.3   Chloride 98 - 111 mmol/L 89  91  90   CO2 22 - 32 mmol/L 31  29    Calcium 8.9 - 10.3 mg/dL 8.7  8.4      Lipid Panel  No results found for: "CHOL", "TRIG", "HDL", "CHOLHDL", "VLDL", "LDLCALC", "LDLDIRECT", "LABVLDL"  No components found for: "NTPROBNP" No results for input(s): "PROBNP" in the last 8760 hours. Recent Labs    06/25/22 1937  TSH 0.388    BMP Recent Labs    06/25/22 0908 06/25/22 0936 06/26/22 0325 06/27/22 0312  NA 128* 127* 132* 132*  K 3.3* 3.3* 3.3* 3.5  CL 90* 90* 91* 89*  CO2 20*  --  29 31  GLUCOSE 121* 124* 97 108*  BUN '16 18 14 12  '$ CREATININE 0.89 0.70 0.88 0.99  CALCIUM 8.7*  --  8.4* 8.7*  GFRNONAA >60  --  >60 56*    HEMOGLOBIN A1C Lab Results  Component Value Date   HGBA1C 5.9 (H) 06/25/2022    MPG 123 06/25/2022    Cardiac Panel (last 3 results) Recent Labs    06/25/22 0908 06/25/22 1125  TROPONINIHS 5 4    CHOLESTEROL No results for input(s): "CHOL" in the last 8760 hours.  Hepatic Function Panel Recent Labs    06/03/22 0703 06/25/22 0908  PROT 6.0* 5.7*  ALBUMIN 2.6* 2.4*  AST 30 29  ALT 21 21  ALKPHOS 124 87  BILITOT 1.2 1.4*     Echocardiogram 06/26/2022: 1. Left ventricular ejection fraction, by estimation, is 65 to 70%. The left ventricle has normal function. The left ventricle has no regional wall motion abnormalities. Left ventricular diastolic parameters are  indeterminate. There is the interventricular septum is flattened in diastole ('D' shaped left ventricle), consistent with right ventricular volume overload.  2. Right ventricular systolic function is normal. The right ventricular  size is is dilated. There is mildly elevated pulmonary artery systolic pressure. The estimated right ventricular systolic pressure is 0000000 mmHg.  3. Left atrial size was severely dilated.  4. Right atrial size was mildly dilated.  5. A small pericardial effusion is present. The pericardial effusion is circumferential and posterior to the left ventricle.  6. The mitral valve is normal in structure. Mild mitral valve regurgitation. No evidence of mitral stenosis.  7. Eccentric tricuspid regurgitation. Tricuspid valve regurgitation is mild to moderate.   8. The aortic valve is tricuspid. There is mild calcification of the aortic valve. There is mild thickening of the aortic valve. Aortic valve  regurgitation is not visualized. No aortic stenosis is present.  9. The inferior vena cava is dilated in size with <50% respiratory variability, suggesting right atrial pressure of 15 mmHg.   Comparison(s): Prior images reviewed side by side. Mitral and tricuspid regurgitation have improved; RVSP and pericardial effusion are similar.   Assessment & Recommendations:  88 year old  Caucasian female with hypertension, arthritis, paroxysmal atrial fibrillation, cardiac tamponade requiring emergency pericardiocentesis in 05/2022, bilateral pleural effusions requiring Rt sided thoracentesis in 05/2022, admitted back with worsening shortness of breath.   Persistent atrial fibrillation: Ventricular rate better controlled Continue metoprolol, diltiazem, and amiodarone.  CHA2DS2VASc score at least 4, annual stroke risk 5%.   Continue Eliquis 5 mg twice daily. Patient remains apprehensive with regards to considering TEE guided cardioversion. May consider cardioversion itself after being on anticoagulation for 3-4 weeks if medically necessary. Hemodynamics are stable and the rate is well-controlled no urgent need for cardioversion  at this time  HFpEF: Stage C, NYHA class II/III. Improving Net IO Since Admission: -3,837.89 mL [06/27/22 1229] Would recommend continuing IV Lasix for additional day to help facilitate diuresis as renal function is stable And responded well to Jardiance. Consider transitioning to oral Bumex tomorrow given low albumin status. Rather focus on diuresis now and consider addition of spironolactone either prior to discharge or as outpatient  Pleural effusion: Chest x-ray this morning notes trace right pleural effusion Continue diuresis. Patient has poor inspiratory effort-encouraged use of incentive spirometer Has some left lateral chest wall pain with coughing.  Recommend transdermal lidocaine patch if no contraindication. White count is trending down, she remains afebrile, and chest x-ray does not illustrate pneumonia.  Pericardial effusion: S/p pericardiocentesis for tamponade (05/2022). Small residual effusion, stable.  Hyponatremia: Dilutional. Improving with diuresis.  Plan of care discussed with nursing staff and family at bedside.  Rex Kras, [place], [facility]  Pager:  [phone] Office: [phone]

## 2022-06-27 NOTE — Assessment & Plan Note (Addendum)
Echo with LVEF 65-70%. RV overload, dilated LV. Cardiology suspect interplay between Afib RVR and underlying HFpEF as cause of her SOB and recurrent pleural effusions.  -continue jardiance '10mg'$  daily

## 2022-06-27 NOTE — Evaluation (Signed)
Physical Therapy Evaluation Patient Details Name: Sheena Taylor MRN: [id number] DOB: 1934/06/14 Today's Date: 06/27/2022  History of Present Illness  88 yo female admitted 3/11 from [facility] where she was receiving rehab with generalized weakness, hypotension and pericardial effusion.  PMHx; Afib, arthritis, HTN, bil TKA, recent admission for afib with RVR and pericardial effusion and cardiac tamponade, underwent  pericardiocentesis.  Clinical Impression  Pt presents today functioning below her mobility baseline with deficits in activity tolerance, strength, and balance. Pt admitted here from SNF where she had been progressing with mobility, most recently utilizing a RW and able to ambulate to the bathroom. Today pt required modA for bed mobility with minA for transfers and ambulation with use of RW. Pt remains motivated to work with therapy and progress mobility back to her baseline as she was very active. Acute PT will continue to follow up with pt to progress mobility, at this time recommend SNF at discharge to maximize independence. Acute PT will continue to follow as appropriate.      Recommendations for follow up therapy are one component of a multi-disciplinary discharge planning process, led by the attending physician.  Recommendations may be updated based on patient status, additional functional criteria and insurance authorization.  Follow Up Recommendations Skilled nursing-short term rehab (<3 hours/day) Can patient physically be transported by private vehicle: Yes    Assistance Recommended at Discharge Frequent or constant Supervision/Assistance  Patient can return home with the following  A little help with walking and/or transfers;Help with stairs or ramp for entrance    Equipment Recommendations None recommended by PT  Recommendations for Other Services       Functional Status Assessment Patient has had a recent decline in their functional status and demonstrates  the ability to make significant improvements in function in a reasonable and predictable amount of time.     Precautions / Restrictions Precautions Precautions: Fall Precaution Comments: watch BP Restrictions Weight Bearing Restrictions: No      Mobility  Bed Mobility Overal bed mobility: Needs Assistance Bed Mobility: Supine to Sit     Supine to sit: Mod assist, HOB elevated     General bed mobility comments: modA for trunk support and pivoting hips to EOB, bed rail utilized    Transfers Overall transfer level: Needs assistance Equipment used: Rolling walker (2 wheels) Transfers: Sit to/from Stand Sit to Stand: Min assist           General transfer comment: minA to power-up, cueing for hand placement    Ambulation/Gait Ambulation/Gait assistance: Min assist Gait Distance (Feet): 20 Feet Assistive device: Rolling walker (2 wheels) Gait Pattern/deviations: Step-through pattern, Decreased stride length, Trunk flexed, Drifts right/left Gait velocity: decreased     General Gait Details: mild path deviation but no LOB, minA provided for safety, mild cues for upright posture  Stairs            Wheelchair Mobility    Modified Rankin (Stroke Patients Only)       Balance Overall balance assessment: Needs assistance Sitting-balance support: Feet supported Sitting balance-Leahy Scale: Fair     Standing balance support: Bilateral upper extremity supported, During functional activity, Reliant on assistive device for balance Standing balance-Leahy Scale: Poor Standing balance comment: reliant on UE support in standing                             Pertinent Vitals/Pain Pain Assessment Pain Assessment: Faces Faces Pain Scale: Hurts  little more Pain Location: L shoulder area Pain Descriptors / Indicators: Grimacing, Discomfort Pain Intervention(s): Limited activity within patient's tolerance, Monitored during session, Repositioned    Home  Living Family/patient expects to be discharged to:: Skilled nursing facility Living Arrangements: Spouse/significant other Available Help at Discharge: Family;Available 24 hours/day Type of Home: Independent living facility Home Access: Level entry;Elevator       Home Layout: One level Home Equipment: Grab bars - tub/shower Additional Comments: Most recently pt was ambulating with a RW at [employer]. Husband available for supervision at home    Prior Function Prior Level of Function : Needs assist             Mobility Comments: very active at baseline, recently requiring RW for mobility ADLs Comments: completing basic ADLs, dependent in IADLs     Hand Dominance   Dominant Hand: Right    Extremity/Trunk Assessment   Upper Extremity Assessment Upper Extremity Assessment: Defer to OT evaluation    Lower Extremity Assessment Lower Extremity Assessment: Generalized weakness    Cervical / Trunk Assessment Cervical / Trunk Assessment: Kyphotic  Communication   Communication: No difficulties  Cognition Arousal/Alertness: Awake/alert Behavior During Therapy: WFL for tasks assessed/performed, Flat affect Overall Cognitive Status: Within Functional Limits for tasks assessed                                 General Comments: husband present throughout session. Pt becoming emotional initially with the situation of being hospitalized but encouragement provided        General Comments General comments (skin integrity, edema, etc.): SPO2 stable on room air, BP after ambulating 103/54 improved from previous reading prior, HR maintaining in 90s    Exercises     Assessment/Plan    PT Assessment Patient needs continued PT services  PT Problem List Decreased strength;Decreased activity tolerance;Decreased balance;Decreased mobility;Cardiopulmonary status limiting activity       PT Treatment Interventions DME instruction;Gait training;Functional mobility  training;Therapeutic activities;Therapeutic exercise;Balance training;Neuromuscular re-education;Patient/family education    PT Goals (Current goals can be found in the Care Plan section)  Acute Rehab PT Goals Patient Stated Goal: go home PT Goal Formulation: With patient/family Time For Goal Achievement: 07/11/22 Potential to Achieve Goals: Good    Frequency Min 3X/week     Co-evaluation               AM-PAC PT "6 Clicks" Mobility  Outcome Measure Help needed turning from your back to your side while in a flat bed without using bedrails?: A Little Help needed moving from lying on your back to sitting on the side of a flat bed without using bedrails?: A Lot Help needed moving to and from a bed to a chair (including a wheelchair)?: A Little Help needed standing up from a chair using your arms (e.g., wheelchair or bedside chair)?: A Little Help needed to walk in hospital room?: A Little Help needed climbing 3-5 steps with a railing? : Total 6 Click Score: 15    End of Session Equipment Utilized During Treatment: Gait belt Activity Tolerance: Patient tolerated treatment well Patient left: in chair;with call bell/phone within reach;with family/visitor present Nurse Communication: Mobility status PT Visit Diagnosis: Other abnormalities of gait and mobility (R26.89);Muscle weakness (generalized) (M62.81)    Time: 1003-1030 PT Time Calculation (min) (ACUTE ONLY): 27 min   Charges:   PT Evaluation $PT Eval Moderate Complexity: 1 Mod  Charlynne Cousins, PT DPT Acute Rehabilitation Services Office [phone]   [name] 06/27/2022, 1:39 PM

## 2022-06-28 ENCOUNTER — Inpatient Hospital Stay (HOSPITAL_COMMUNITY): Payer: Medicare Other

## 2022-06-28 LAB — BASIC METABOLIC PANEL
Anion gap: 9 (ref 5–15)
BUN: 16 mg/dL (ref 8–23)
CO2: 33 mmol/L — ABNORMAL HIGH (ref 22–32)
Calcium: 8.8 mg/dL — ABNORMAL LOW (ref 8.9–10.3)
Chloride: 88 mmol/L — ABNORMAL LOW (ref 98–111)
Creatinine, Ser: 1.01 mg/dL — ABNORMAL HIGH (ref 0.44–1.00)
GFR, Estimated: 55 mL/min — ABNORMAL LOW (ref >60)
Glucose, Bld: 109 mg/dL — ABNORMAL HIGH (ref 70–99)
Potassium: 3.2 mmol/L — ABNORMAL LOW (ref 3.5–5.1)
Sodium: 130 mmol/L — ABNORMAL LOW (ref 135–145)

## 2022-06-28 LAB — CBC
HCT: 31.9 % — ABNORMAL LOW (ref 36.0–46.0)
Hemoglobin: 10.5 g/dL — ABNORMAL LOW (ref 12.0–15.0)
MCH: 29.2 pg (ref 26.0–34.0)
MCHC: 32.9 g/dL (ref 30.0–36.0)
MCV: 88.9 fL (ref 80.0–100.0)
Platelets: 403 10*3/uL — ABNORMAL HIGH (ref 150–400)
RBC: 3.59 MIL/uL — ABNORMAL LOW (ref 3.87–5.11)
RDW: 14.5 % (ref 11.5–15.5)
WBC: 11.2 10*3/uL — ABNORMAL HIGH (ref 4.0–10.5)
nRBC: 0 % (ref 0.0–0.2)

## 2022-06-28 MED ORDER — POTASSIUM CHLORIDE CRYS ER 20 MEQ PO TBCR
40.0000 meq | EXTENDED_RELEASE_TABLET | Freq: Once | ORAL | Status: AC
  Administered 2022-06-28: 40 meq via ORAL
  Filled 2022-06-28: qty 2

## 2022-06-28 MED ORDER — LORATADINE 10 MG PO TABS
10.0000 mg | ORAL_TABLET | Freq: Every day | ORAL | Status: DC | PRN
  Administered 2022-06-28: 10 mg via ORAL
  Filled 2022-06-28: qty 1

## 2022-06-28 NOTE — Progress Notes (Addendum)
Subjective:  Breathing improved   Current Facility-Administered Medications:    acetaminophen (TYLENOL) tablet 650 mg, 650 mg, Oral, Q6H PRN, Colletta Maryland, MD, 650 mg at 06/27/22 1439   amiodarone (PACERONE) tablet 200 mg, 200 mg, Oral, BID, Madelaine Whipple J, MD, 200 mg at 06/27/22 2230   apixaban (ELIQUIS) tablet 5 mg, 5 mg, Oral, BID, Ezequiel Essex, MD, 5 mg at 06/27/22 2230   diltiazem (CARDIZEM SR) 12 hr capsule 120 mg, 120 mg, Oral, BID, Ezequiel Essex, MD, 120 mg at 06/27/22 2230   empagliflozin (JARDIANCE) tablet 10 mg, 10 mg, Oral, Daily, Guilianna Mckoy J, MD, 10 mg at 06/27/22 0843   furosemide (LASIX) injection 40 mg, 40 mg, Intravenous, BID, Ezequiel Essex, MD, 40 mg at 06/27/22 1723   lidocaine (LIDODERM) 5 % 1 patch, 1 patch, Transdermal, Daily, Colletta Maryland, MD, 1 patch at 06/27/22 1227   metoprolol tartrate (LOPRESSOR) tablet 50 mg, 50 mg, Oral, BID, Ezequiel Essex, MD, 50 mg at 06/27/22 0844   pantoprazole (PROTONIX) EC tablet 40 mg, 40 mg, Oral, q AM, Ezequiel Essex, MD, 40 mg at 06/27/22 0844   pravastatin (PRAVACHOL) tablet 20 mg, 20 mg, Oral, QPM, Ezequiel Essex, MD, 20 mg at 06/27/22 1723   Objective:  Vital Signs in the last 24 hours: Temp:  [97.9 F (36.6 C)-98.4 F (36.9 C)] 98.4 F (36.9 C) (03/14 0301) Pulse Rate:  [70-105] 90 (03/14 0301) Resp:  [18-26] 20 (03/14 0301) BP: (96-120)/(54-77) 106/57 (03/14 0301) SpO2:  [90 %-95 %] 90 % (03/14 0301)  Intake/Output from previous day: 03/13 0701 - 03/14 0700 In: 240 [P.O.:240] Out: 1000 [Urine:1000]  Physical Exam Vitals and nursing note reviewed.  Constitutional:      General: She is not in acute distress. Neck:     Vascular: No JVD.  Cardiovascular:     Rate and Rhythm: Normal rate. Rhythm irregular.     Heart sounds: Normal heart sounds. No murmur heard. Pulmonary:     Effort: Pulmonary effort is normal.     Breath sounds: Normal breath sounds. No wheezing or rales.   Musculoskeletal:     Right lower leg: No edema.     Left lower leg: No edema.      Imaging/tests reviewed and independently interpreted:  CXR 06/26/2022: Personally reviewed and compared. Rt pleural effusion slightly improved than 06/25/2022.   Cardiac Studies:  Telemetry 06/26/2022: Afib with improved rate control  EKG 06/25/2022: Atrial fibrillation Left anterior fascicular block Borderline low voltage, extremity leads Nonspecific repol abnormality, lateral leads Prolonged QT interval Confirmed  Echocardiogram 06/26/2022: 1. Left ventricular ejection fraction, by estimation, is 65 to 70%. The left ventricle has normal function. The left ventricle has no regional wall motion abnormalities. Left ventricular diastolic parameters are  indeterminate. There is the interventricular septum is flattened in diastole ('D' shaped left ventricle), consistent with right ventricular volume overload.  2. Right ventricular systolic function is normal. The right ventricular  size is is dilated. There is mildly elevated pulmonary artery systolic pressure. The estimated right ventricular systolic pressure is 0000000 mmHg.  3. Left atrial size was severely dilated.  4. Right atrial size was mildly dilated.  5. A small pericardial effusion is present. The pericardial effusion is circumferential and posterior to the left ventricle.  6. The mitral valve is normal in structure. Mild mitral valve regurgitation. No evidence of mitral stenosis.  7. Eccentric tricuspid regurgitation. Tricuspid valve regurgitation is mild to moderate.   8. The aortic valve is tricuspid. There is  mild calcification of the aortic valve. There is mild thickening of the aortic valve. Aortic valve  regurgitation is not visualized. No aortic stenosis is present.  9. The inferior vena cava is dilated in size with <50% respiratory variability, suggesting right atrial pressure of 15 mmHg.   Comparison(s): Prior images reviewed side by  side. Mitral and tricuspid regurgitation have improved; RVSP and pericardial effusion are similar.     Assessment & Recommendations:  88 year old Caucasian female with hypertension, arthritis, paroxysmal atrial fibrillation, cardiac tamponade requiring emergency pericardiocentesis in 05/2022, bilateral pleural effusions requiring Rt sided thoracentesis in 05/2022, admitted back with worsening shortness of breath.   A-fib with RVR: Rate well controlled on PO Diltiazem SR 120 mg bid, amiodarone 200 mg bid. She did not get metoprolol tartrate 50 mg on 3/13 evening, probably due to SBP in 90s. Continue rate control + amiodarone for now. Okay to hold metoprolol if SBP stays <110 mmHg, as long as rate is controlled.  CHA2DS2VASc score at least 4, annual stroke risk 5%.  Continue Eliquis 5 mg twice daily.  HFpEF: Acute on chronic exacerbation. Nearly resolved. Net -5 L for the hospital stay. I will discontinue IV lasix today. Continue Jardiance 10 mg daily. Given low Na, I will hold off adding spironolactone for now.  Pleural effusion: B/l R>L. Improving with diuresis.  Check chest Xray today.  Pericardial effusion: S/p pericardiocentesis for tamponade (05/2022). Small residual effusion, stable.  Hyponatremia: Initially felt to be dilutional, was improving with diuresis. Now she appears euvolemic, but Na has gone down again today to 130 in spite of diuresis. There could be a component of depletional hyponatremia today. Hold lasix today (3/14).   We will continue to follow.  Defer disposition to the primary team.   Discussed interpretation of tests and management recommendations with the primary team   Nigel Mormon, MD Pager: [phone] Office: [phone]

## 2022-06-28 NOTE — Assessment & Plan Note (Addendum)
Na improved this AM to 133. K 3.4, replete K.  -repleted K  -consider further work-up if Na<120

## 2022-06-28 NOTE — Progress Notes (Signed)
   06/28/22 1711  Assess: MEWS Score  Temp 97.7 F (36.5 C)  BP 123/78  MAP (mmHg) 89  ECG Heart Rate (!) 112  Resp 20  Assess: MEWS Score  MEWS Temp 0  MEWS Systolic 0  MEWS Pulse 2  MEWS RR 0  MEWS LOC 0  MEWS Score 2  MEWS Score Color Yellow  Assess: if the MEWS score is Yellow or Red  Were vital signs taken at a resting state? Yes  Focused Assessment No change from prior assessment  Does the patient meet 2 or more of the SIRS criteria? No  Does the patient have a confirmed or suspected source of infection? No  Provider and Rapid Response Notified? Yes  MEWS guidelines implemented  Yes, yellow  Treat  MEWS Interventions Considered administering scheduled or prn medications/treatments as ordered  Take Vital Signs  Increase Vital Sign Frequency  Yellow: Q2hr x1, continue Q4hrs until patient remains green for 12hrs  Escalate  MEWS: Escalate Yellow: Discuss with charge nurse and consider notifying provider and/or RRT  Provider Notification  Provider Name/Title Patwardhan, MD  Date Provider Notified 06/28/22  Time Provider Notified 1800  Method of Notification Page  Notification Reason New onset of dysrhythmia (afib, HR fluctuating 110-120s)  Provider response Other (Comment) (per MD, okay to give this AM's held metoprolol dose now. 2nd dose can be given at later scheduled time if needed.)  Date of Provider Response 06/28/22  Time of Provider Response 1800  Assess: SIRS CRITERIA  SIRS Temperature  0  SIRS Pulse 1  SIRS Respirations  0  SIRS WBC 0  SIRS Score Sum  1

## 2022-06-28 NOTE — Progress Notes (Signed)
Daily Progress Note Intern Pager: [phone]  Patient name: Sheena Taylor Medical record number: [id number] Date of birth: 06/19/1934 Age: 88 y.o. Gender: female  Primary Care Provider: Garwin Brothers, MD Consultants: cardiology Code Status: FULL  Pt Overview and Major Events to Date:  3/12 - admitted    Assessment and Plan: CHOLE Taylor is a 88 y.o. female presenting with shortness of breath and fatigue for the last 3 days due to recurrent pleural effusion thought to be due to interplay between Afib RVR and possible underlying HFpEF.    Pertinent PMH/PSH includes HTN, paroxysmal Afib, cardiac tamponade requiring emergency pericardiocentesis 05/2022, bilateral pleural effusion requiring Rt sided thoracentesis 05/2022.   * Pleural effusion SpO2 to 2L overnight, discussed with nurse who stated patient desat while sleeping but is off O2 this AM. UOP 1.4L yesterday. Received lidocaine patch for chest pain with coughing.  - Cardiology consulted, appreciate their ongoing care - Holding IV Lasix in the setting of hyponatremia  - Strict ins and outs  - Supplemental O2 as needed for SpO2 <92%  Pericardial effusion Repeat echo with small pericardial effusion -stable per cardiology  Atrial fibrillation with RVR ([name]) Now rate controlled and holding off on TEE/cardioversion given patient reluctance.  - Cardiology consulted, appreciate their care of this patient - started PO amiodarone '200mg'$  BID per cards  - continue eliquis '5mg'$  BID per cards  - Continue home p.o. diltiazem '120mg'$  BID  - continue home metoprolol '50mg'$  BID   Hypotension Soft Bps overnight.  - continue to hold home HCTZ, lisinopril -Trend BMP, consider adding back lisinopril first if needed  Acute on chronic heart failure with preserved ejection fraction (HFpEF) ([name]) Echo with LVEF 65-70%. RV overload, dilated LV. Cardiology suspect interplay between Afib RVR and underlying HFpEF as cause of her SOB and  recurrent pleural effusions.  -continue jardiance '10mg'$  daily   Leukocytosis Leukocytosis continues to downtrend. Last pleural sample with albumin <1.5, protein <3, glucose 114, suspected transudative. No growth on culture. No cell count or cytology. Pericardial effusion without malignant cells. Wonder if malignancy may be causing recurrent pleural effusions.  -order cell count and cytology if she requires repeat thoracentesis  Hyponatremia Na 130 this AM. Unclear cause of hyponatremia. Initially thought dilutional.  -lasix held today per cardiology.    FEN/GI: regular PPx: on eliquis  Dispo: likely back to SNF rehab   Subjective:  NAEO. Patient seen sitting up in bed. She denies any new chest pain or SOB. Was not SOB overnight. Has food wrappings on table next to her and asks if nurse can remove it. Denies any chest pain, SOB, n/v. Eager to go back to SNF.   Objective: Temp:  [98 F (36.7 C)-98.5 F (36.9 C)] 98.5 F (36.9 C) (03/14 0736) Pulse Rate:  [70-105] 93 (03/14 0736) Resp:  [17-23] 20 (03/14 0736) BP: (96-116)/(54-77) 116/72 (03/14 0736) SpO2:  [90 %-100 %] 98 % (03/14 0736) Physical Exam: General: alert, in no acute distress Cardiovascular: irregular rhythm. Regular rate. No m/r/g. Respiratory: CTAB. Normal WOB on RA. Abdomen: soft, non-tender, non-distended  Extremities: 1+ edema BLE.   Laboratory: Most recent CBC Lab Results  Component Value Date   WBC 11.2 (H) 06/28/2022   HGB 10.5 (L) 06/28/2022   HCT 31.9 (L) 06/28/2022   MCV 88.9 06/28/2022   PLT 403 (H) 06/28/2022   Most recent BMP    Latest Ref Rng & Units 06/28/2022    2:17 AM  BMP  Glucose 70 -  99 mg/dL 109   BUN 8 - 23 mg/dL 16   Creatinine 0.44 - 1.00 mg/dL 1.01   Sodium 135 - 145 mmol/L 130   Potassium 3.5 - 5.1 mmol/L 3.2   Chloride 98 - 111 mmol/L 88   CO2 22 - 32 mmol/L 33   Calcium 8.9 - 10.3 mg/dL 8.8     [name], MD 06/28/2022, 9:44 AM  PGY-1, [place] Intern pager: [phone], text pages welcome Secure chat group [place]

## 2022-06-28 NOTE — TOC Progression Note (Signed)
Transition of Care [facility]) - Progression Note    Patient Details  Name: Sheena Taylor MRN: [id number] Date of Birth: 1934-12-12  Transition of Care [facility]) CM/SW [place], LCSW Phone Number: 06/28/2022, 4:21 PM  Clinical Narrative:    CSW initiated insurance authorization process for [facility], Ref# [id number].   Expected Discharge Plan: [place] Barriers to Discharge: Continued Medical Work up, Ship broker  Expected Discharge Plan and Services In-house Referral: Clinical Social Work   Post Acute Care Choice: [place] Living arrangements for the past 2 months: [place], Materials engineer                                       Social Determinants of Health (SDOH) Interventions SDOH Screenings   Food Insecurity: No Food Insecurity (06/03/2022)  Housing: Low Risk  (06/03/2022)  Transportation Needs: No Transportation Needs (06/03/2022)  Utilities: Not At Risk (06/03/2022)  Tobacco Use: High Risk (06/26/2022)    Readmission Risk Interventions     No data to display

## 2022-06-29 ENCOUNTER — Other Ambulatory Visit (HOSPITAL_COMMUNITY): Payer: Self-pay

## 2022-06-29 LAB — BASIC METABOLIC PANEL
Anion gap: 12 (ref 5–15)
BUN: 14 mg/dL (ref 8–23)
CO2: 29 mmol/L (ref 22–32)
Calcium: 9.4 mg/dL (ref 8.9–10.3)
Chloride: 92 mmol/L — ABNORMAL LOW (ref 98–111)
Creatinine, Ser: 0.91 mg/dL (ref 0.44–1.00)
GFR, Estimated: >60 mL/min (ref >60)
Glucose, Bld: 94 mg/dL (ref 70–99)
Potassium: 3.4 mmol/L — ABNORMAL LOW (ref 3.5–5.1)
Sodium: 133 mmol/L — ABNORMAL LOW (ref 135–145)

## 2022-06-29 LAB — CBC
HCT: 34.7 % — ABNORMAL LOW (ref 36.0–46.0)
Hemoglobin: 11.5 g/dL — ABNORMAL LOW (ref 12.0–15.0)
MCH: 29.3 pg (ref 26.0–34.0)
MCHC: 33.1 g/dL (ref 30.0–36.0)
MCV: 88.3 fL (ref 80.0–100.0)
Platelets: 443 10*3/uL — ABNORMAL HIGH (ref 150–400)
RBC: 3.93 MIL/uL (ref 3.87–5.11)
RDW: 14.3 % (ref 11.5–15.5)
WBC: 12 10*3/uL — ABNORMAL HIGH (ref 4.0–10.5)
nRBC: 0 % (ref 0.0–0.2)

## 2022-06-29 MED ORDER — EMPAGLIFLOZIN 10 MG PO TABS
10.0000 mg | ORAL_TABLET | Freq: Every day | ORAL | 0 refills | Status: DC
  Filled 2022-06-29: qty 30, 30d supply, fill #0

## 2022-06-29 MED ORDER — AMIODARONE HCL 200 MG PO TABS
200.0000 mg | ORAL_TABLET | Freq: Two times a day (BID) | ORAL | 0 refills | Status: DC
  Filled 2022-06-29: qty 60, 30d supply, fill #0

## 2022-06-29 MED ORDER — METOPROLOL TARTRATE 25 MG PO TABS
25.0000 mg | ORAL_TABLET | Freq: Two times a day (BID) | ORAL | 0 refills | Status: DC
  Filled 2022-06-29: qty 60, 30d supply, fill #0

## 2022-06-29 MED ORDER — POTASSIUM CHLORIDE 20 MEQ PO PACK
40.0000 meq | PACK | Freq: Once | ORAL | Status: AC
  Administered 2022-06-29: 40 meq via ORAL
  Filled 2022-06-29: qty 2

## 2022-06-29 MED ORDER — METOPROLOL TARTRATE 25 MG PO TABS: 25.0000 mg | ORAL_TABLET | Freq: Two times a day (BID) | ORAL | Status: DC

## 2022-06-29 NOTE — Discharge Instructions (Addendum)
Dear Sheena Taylor,   Thank you for letting us participate in your care! In this section, you will find a brief hospital admission summary of why you were admitted to the hospital and post-hospital plan.  You were admitted because you were experiencing worsening shortness of breath.  Most likely due to your heart failure and so you were given medication to excrete excess fluid.  In addition you were found to have atrial fibrillation with elevated heart rate, cardiology was consulted and you were treated with IV medication and eventually transitioned to oral medication once your HR improved.   Please make sure to call your primary care physician and schedule a close hospital follow-up appointments with them.  [phone]     POST-HOSPITAL & CARE INSTRUCTIONS Please let PCP/Specialists know of any changes in medications that were made.  Please see medications section of this packet for any medication changes.   DOCTOR'S APPOINTMENTS & FOLLOW UP No future appointments.   Thank you for choosing [facility]! Take care and be well!  [facility]  [place], [place] [address] [phone]

## 2022-06-29 NOTE — TOC Transition Note (Signed)
Transition of Care [facility]) - CM/SW Discharge Note   Patient Details  Name: Sheena Taylor MRN: [id number] Date of Birth: April 05, 1935  Transition [facility] Care [facility]) CM/SW Contact:  Benard Halsted, LCSW Phone Number: 06/29/2022, 2:22 PM   Clinical Narrative:    Patient will DC to: [facility] Anticipated DC date: 06/29/22 Family notified: Spouse Transport by: Corey Harold   Per MD patient ready for DC to [facility]. RN to call report prior to discharge ([phone]). RN, patient, patient's family, and facility notified of DC. Discharge Summary and FL2 sent to facility. DC packet on chart. Ambulance transport requested for patient.   CSW will sign off for now as social work intervention is no longer needed. Please consult [place] again if new needs arise.  t   Final next level of care: Skilled Nursing Facility Barriers to Discharge: Barriers Resolved   Patient Goals and CMS Choice CMS Medicare.gov Compare Post Acute Care list provided to:: Patient Choice offered to / list presented to : Patient, Spouse  Discharge Placement     Existing PASRR number confirmed : 06/29/22          Patient chooses bed at: [facility] Patient to be transferred to facility by: [place] Name of family member notified: Spouse Patient and family notified of of transfer: 06/29/22  Discharge Plan and Services Additional resources added to the After Visit Summary for   In-house Referral: Clinical Social Work   Post Acute Care Choice: [place]                               Social Determinants of Health (SDOH) Interventions SDOH Screenings   Food Insecurity: No Food Insecurity (06/03/2022)  Housing: Low Risk  (06/03/2022)  Transportation Needs: No Transportation Needs (06/03/2022)  Utilities: Not At Risk (06/03/2022)  Tobacco Use: High Risk (06/26/2022)     Readmission Risk Interventions     No data to display

## 2022-06-29 NOTE — Progress Notes (Signed)
Subjective:  Breathing improved She wants to go home ([place])   Current Facility-Administered Medications:    acetaminophen (TYLENOL) tablet 650 mg, 650 mg, Oral, Q6H PRN, Colletta Maryland, MD, 650 mg at 06/27/22 1439   amiodarone (PACERONE) tablet 200 mg, 200 mg, Oral, BID, Aleksa Catterton J, MD, 200 mg at 06/29/22 0900   apixaban (ELIQUIS) tablet 5 mg, 5 mg, Oral, BID, Ezequiel Essex, MD, 5 mg at 06/29/22 0900   diltiazem (CARDIZEM SR) 12 hr capsule 120 mg, 120 mg, Oral, BID, Ezequiel Essex, MD, 120 mg at 06/29/22 0901   empagliflozin (JARDIANCE) tablet 10 mg, 10 mg, Oral, Daily, Gerrica Cygan J, MD, 10 mg at 06/29/22 0859   lidocaine (LIDODERM) 5 % 1 patch, 1 patch, Transdermal, Daily, Colletta Maryland, MD, 1 patch at 06/29/22 0900   loratadine (CLARITIN) tablet 10 mg, 10 mg, Oral, Daily PRN, Dickie La, MD, 10 mg at 06/28/22 0658   metoprolol tartrate (LOPRESSOR) tablet 25 mg, 25 mg, Oral, BID, Bruchy Mikel J, MD   pantoprazole (PROTONIX) EC tablet 40 mg, 40 mg, Oral, q AM, Ezequiel Essex, MD, 40 mg at 06/29/22 0601   pravastatin (PRAVACHOL) tablet 20 mg, 20 mg, Oral, QPM, Ezequiel Essex, MD, 20 mg at 06/28/22 1717   Objective:  Vital Signs in the last 24 hours: Temp:  [97.7 F (36.5 C)-98.8 F (37.1 C)] 98.5 F (36.9 C) (03/15 0804) Pulse Rate:  [81-101] 96 (03/15 0804) Resp:  [17-25] 17 (03/15 0804) BP: (95-123)/(62-78) 109/73 (03/15 0804) SpO2:  [90 %-95 %] 93 % (03/15 0804)  Intake/Output from previous day: 03/14 0701 - 03/15 0700 In: -  Out: 2500 [Urine:2500]  Physical Exam Vitals and nursing note reviewed.  Constitutional:      General: She is not in acute distress. Neck:     Vascular: No JVD.  Cardiovascular:     Rate and Rhythm: Normal rate. Rhythm irregular.     Heart sounds: Normal heart sounds. No murmur heard. Pulmonary:     Effort: Pulmonary effort is normal.     Breath sounds: Normal breath sounds. No wheezing or  rales.  Musculoskeletal:     Right lower leg: No edema.     Left lower leg: No edema.      Imaging/tests reviewed and independently interpreted:  CXR 06/26/2022: Personally reviewed and compared. Rt pleural effusion slightly improved than 06/25/2022.   Cardiac Studies:  Telemetry 06/26/2022: Afib with improved rate control  EKG 06/25/2022: Atrial fibrillation Left anterior fascicular block Borderline low voltage, extremity leads Nonspecific repol abnormality, lateral leads Prolonged QT interval Confirmed  Echocardiogram 06/26/2022: 1. Left ventricular ejection fraction, by estimation, is 65 to 70%. The left ventricle has normal function. The left ventricle has no regional wall motion abnormalities. Left ventricular diastolic parameters are  indeterminate. There is the interventricular septum is flattened in diastole ('D' shaped left ventricle), consistent with right ventricular volume overload.  2. Right ventricular systolic function is normal. The right ventricular  size is is dilated. There is mildly elevated pulmonary artery systolic pressure. The estimated right ventricular systolic pressure is 0000000 mmHg.  3. Left atrial size was severely dilated.  4. Right atrial size was mildly dilated.  5. A small pericardial effusion is present. The pericardial effusion is circumferential and posterior to the left ventricle.  6. The mitral valve is normal in structure. Mild mitral valve regurgitation. No evidence of mitral stenosis.  7. Eccentric tricuspid regurgitation. Tricuspid valve regurgitation is mild to moderate.   8. The  aortic valve is tricuspid. There is mild calcification of the aortic valve. There is mild thickening of the aortic valve. Aortic valve  regurgitation is not visualized. No aortic stenosis is present.  9. The inferior vena cava is dilated in size with <50% respiratory variability, suggesting right atrial pressure of 15 mmHg.   Comparison(s): Prior images reviewed  side by side. Mitral and tricuspid regurgitation have improved; RVSP and pericardial effusion are similar.     Assessment & Recommendations:  88 year old Caucasian female with hypertension, arthritis, paroxysmal atrial fibrillation, cardiac tamponade requiring emergency pericardiocentesis in 05/2022, bilateral pleural effusions requiring Rt sided thoracentesis in 05/2022, admitted back with worsening shortness of breath.   A-fib with RVR: Rate well controlled on PO Diltiazem SR 120 mg bid, amiodarone 200 mg bid.  She has missed PO metoprolol doses due to borderline low blood pressure. I reduce PO metoprolol to 25 mg bid, okay to give if SBP >90 mmHg. CHA2DS2VASc score at least 4, annual stroke risk 5%.  Continue Eliquis 5 mg twice daily.  HFpEF: Acute on chronic exacerbation. Now resolved. Net -7.4 L for the hospital stay. Continue Jardiance 10 mg daily. I will add spironolactone outpatient.   Pleural effusion: B/l R>L. Improving with diuresis.   Pericardial effusion: S/p pericardiocentesis for tamponade (05/2022). Small residual effusion, stable.  Hyponatremia: Improving.  We will sign off at this time.  F/u w/me on 3/19 1 PM.   Discussed interpretation of tests and management recommendations with the primary team   Nigel Mormon, MD Pager: [phone] Office: [phone]

## 2022-06-29 NOTE — Plan of Care (Signed)
  Problem: Education: Goal: Knowledge of disease or condition will improve Outcome: Completed/Met Goal: Understanding of medication regimen will improve Outcome: Completed/Met Goal: Individualized Educational Video(s) Outcome: Completed/Met   Problem: Activity: Goal: Ability to tolerate increased activity will improve Outcome: Completed/Met   Problem: Cardiac: Goal: Ability to achieve and maintain adequate cardiopulmonary perfusion will improve Outcome: Completed/Met   Problem: Health Behavior/Discharge Planning: Goal: Ability to safely manage health-related needs after discharge will improve Outcome: Completed/Met   Problem: Education: Goal: Knowledge of General Education information will improve Description: Including pain rating scale, medication(s)/side effects and non-pharmacologic comfort measures Outcome: Completed/Met   Problem: Health Behavior/Discharge Planning: Goal: Ability to manage health-related needs will improve Outcome: Completed/Met   Problem: Clinical Measurements: Goal: Ability to maintain clinical measurements within normal limits will improve Outcome: Completed/Met Goal: Will remain free from infection Outcome: Completed/Met Goal: Diagnostic test results will improve Outcome: Completed/Met Goal: Respiratory complications will improve Outcome: Completed/Met Goal: Cardiovascular complication will be avoided Outcome: Completed/Met   Problem: Activity: Goal: Risk for activity intolerance will decrease Outcome: Completed/Met   Problem: Nutrition: Goal: Adequate nutrition will be maintained Outcome: Completed/Met   Problem: Coping: Goal: Level of anxiety will decrease Outcome: Completed/Met   Problem: Elimination: Goal: Will not experience complications related to bowel motility Outcome: Completed/Met Goal: Will not experience complications related to urinary retention Outcome: Completed/Met   Problem: Pain Managment: Goal: General  experience of comfort will improve Outcome: Completed/Met   Problem: Safety: Goal: Ability to remain free from injury will improve Outcome: Completed/Met   Problem: Skin Integrity: Goal: Risk for impaired skin integrity will decrease Outcome: Completed/Met   

## 2022-06-29 NOTE — Progress Notes (Signed)
Physical Therapy Treatment Patient Details Name: Sheena Taylor MRN: [id number] DOB: 17-Jul-1934 Today's Date: 06/29/2022   History of Present Illness 88 yo female admitted 3/11 from [facility] where she was receiving rehab with generalized weakness, hypotension and pericardial effusion.  PMHx; Afib, arthritis, HTN, bil TKA, recent admission for afib with RVR and pericardial effusion and cardiac tamponade, underwent  pericardiocentesis.    PT Comments    Pt tolerated today's session well, continues to fatigue with mobility but very motivated to work with therapy. SPO2 maintaining on room air, pt requiring 2 standing rest breaks with ambulation due to fatigue, recovering well. Pt provided cued for posture with ambulation and correcting but noting trunk flexion with fatigue. Acute PT will continue to follow up with pt during admission to progress mobility, discharge recommendations remain appropriate.     Recommendations for follow up therapy are one component of a multi-disciplinary discharge planning process, led by the attending physician.  Recommendations may be updated based on patient status, additional functional criteria and insurance authorization.  Follow Up Recommendations  Skilled nursing-short term rehab (<3 hours/day) Can patient physically be transported by private vehicle: Yes   Assistance Recommended at Discharge Frequent or constant Supervision/Assistance  Patient can return home with the following A little help with walking and/or transfers;Help with stairs or ramp for entrance   Equipment Recommendations  None recommended by PT    Recommendations for Other Services       Precautions / Restrictions Precautions Precautions: Fall Precaution Comments: watch BP Restrictions Weight Bearing Restrictions: No     Mobility  Bed Mobility               General bed mobility comments: pt in chair upon arrival, ended session in chair    Transfers Overall  transfer level: Needs assistance Equipment used: Rolling walker (2 wheels) Transfers: Sit to/from Stand Sit to Stand: Min guard           General transfer comment: minG for safety and balance, cued for hand placement    Ambulation/Gait Ambulation/Gait assistance: Min guard Gait Distance (Feet): 150 Feet (with 2 standing rest breaks) Assistive device: Rolling walker (2 wheels) Gait Pattern/deviations: Step-through pattern, Decreased stride length, Trunk flexed, Drifts right/left Gait velocity: decreased     General Gait Details: minG for safety, mild path deviation but no LOB or imbalance noted today, pt fatiguing with mobility requiring standing rest breaks   Stairs             Wheelchair Mobility    Modified Rankin (Stroke Patients Only)       Balance Overall balance assessment: Needs assistance Sitting-balance support: No upper extremity supported, Feet supported Sitting balance-Leahy Scale: Good     Standing balance support: Bilateral upper extremity supported, During functional activity Standing balance-Leahy Scale: Poor Standing balance comment: reliant on UE support with ambulation                            Cognition Arousal/Alertness: Awake/alert Behavior During Therapy: WFL for tasks assessed/performed, Flat affect Overall Cognitive Status: Within Functional Limits for tasks assessed                                 General Comments: husband and son present throughout, pt motivated to work with therapy, pleasant during session        Exercises      General  Comments General comments (skin integrity, edema, etc.): SPO2 stable on room air, HR elevating to 120s but recovers with rest breaks, RR elevated with ambulation, cued for deep breathing      Pertinent Vitals/Pain Pain Assessment Pain Assessment: Faces Faces Pain Scale: Hurts a little bit Pain Location: generalized Pain Descriptors / Indicators: Grimacing,  Discomfort Pain Intervention(s): Limited activity within patient's tolerance, Monitored during session    Home Living                          Prior Function            PT Goals (current goals can now be found in the care plan section) Acute Rehab PT Goals Patient Stated Goal: go home PT Goal Formulation: With patient/family Time For Goal Achievement: 07/11/22 Potential to Achieve Goals: Good Progress towards PT goals: Progressing toward goals    Frequency    Min 3X/week      PT Plan Current plan remains appropriate    Co-evaluation              AM-PAC PT "6 Clicks" Mobility   Outcome Measure  Help needed turning from your back to your side while in a flat bed without using bedrails?: A Little Help needed moving from lying on your back to sitting on the side of a flat bed without using bedrails?: A Lot Help needed moving to and from a bed to a chair (including a wheelchair)?: A Little Help needed standing up from a chair using your arms (e.g., wheelchair or bedside chair)?: A Little Help needed to walk in hospital room?: A Little Help needed climbing 3-5 steps with a railing? : Total 6 Click Score: 15    End of Session Equipment Utilized During Treatment: Gait belt Activity Tolerance: Patient tolerated treatment well Patient left: in chair;with call bell/phone within reach;with family/visitor present Nurse Communication: Mobility status PT Visit Diagnosis: Other abnormalities of gait and mobility (R26.89);Muscle weakness (generalized) (M62.81)     Time: [id number] PT Time Calculation (min) (ACUTE ONLY): 26 min  Charges:  $Gait Training: 8-22 mins $Therapeutic Activity: 8-22 mins                     Charlynne Cousins, PT DPT Acute Rehabilitation Services Office [phone]    [name] 06/29/2022, 2:27 PM

## 2022-06-29 NOTE — TOC Progression Note (Signed)
Transition of Care [facility]) - Progression Note    Patient Details  Name: Sheena Taylor MRN: [id number] Date of Birth: 1934-05-07  Transition of Care [facility]) CM/SW [place], LCSW Phone Number: 06/29/2022, 8:44 AM  Clinical Narrative:    Approval received for [facility], Ref# [id number], Auth ID# [id number], effective 06/29/2022-07/03/2022. Will follow for medical stability.    Expected Discharge Plan: [place] Barriers to Discharge: Continued Medical Work up, Ship broker  Expected Discharge Plan and Services In-house Referral: Clinical Social Work   Post Acute Care Choice: [place] Living arrangements for the past 2 months: [place], Materials engineer                                       Social Determinants of Health (SDOH) Interventions SDOH Screenings   Food Insecurity: No Food Insecurity (06/03/2022)  Housing: Low Risk  (06/03/2022)  Transportation Needs: No Transportation Needs (06/03/2022)  Utilities: Not At Risk (06/03/2022)  Tobacco Use: High Risk (06/26/2022)    Readmission Risk Interventions     No data to display

## 2022-06-29 NOTE — Progress Notes (Signed)
Daily Progress Note Intern Pager: [phone]  Patient name: Sheena Taylor Medical record number: [id number] Date of birth: 10/10/1934 Age: 88 y.o. Gender: female  Primary Care Provider: Garwin Brothers, MD Consultants: cardiology Code Status: FULL  Pt Overview and Major Events to Date:  3/12 - admitted    Assessment and Plan: Sheena Taylor is a 88 y.o. female presenting with shortness of breath and fatigue for the last 3 days due to recurrent pleural effusion thought to be due to interplay between Afib RVR and possible underlying HFpEF.    Pertinent PMH/PSH includes HTN, paroxysmal Afib, cardiac tamponade requiring emergency pericardiocentesis 05/2022, bilateral pleural effusion requiring Rt sided thoracentesis 05/2022.  * Pleural effusion On RA overnight. UOP 2.5L yesterday. Repeat CXR with decreased effusion, no acute cardiopulmonary process. - Cardiology consulted, appreciate their ongoing care - Held IV Lasix yesterday in the setting of hyponatremia  - Strict ins and outs  - Supplemental O2 as needed for SpO2 <92%  Pericardial effusion Repeat echo with small pericardial effusion -stable per cardiology  Atrial fibrillation with RVR ([place]) Noted to have increased HR yesterday afternoon (112) and was given metoprolol. Was held 3/14 AM, suspect due to hypotension. Holding off on TEE/cardioversion given patient reluctance.  - Cardiology consulted, appreciate their care of this patient - started PO amiodarone 200mg  BID per cards  - continue eliquis 5mg  BID per cards  - Continue home p.o. diltiazem 120mg  BID  - continue home metoprolol 50mg  BID   Hypotension BP stable overnight. - continue to hold home HCTZ, lisinopril -Trend BMP, consider adding back lisinopril first if needed  Acute on chronic heart failure with preserved ejection fraction (HFpEF) ([place]) Echo with LVEF 65-70%. RV overload, dilated LV. Cardiology suspect interplay between Afib RVR and underlying HFpEF  as cause of her SOB and recurrent pleural effusions.  -continue jardiance 10mg  daily   Leukocytosis Leukocytosis continues to downtrend. Last pleural sample with albumin <1.5, protein <3, glucose 114, suspected transudative. No growth on culture. No cell count or cytology. Pericardial effusion without malignant cells. Wonder if malignancy may be causing recurrent pleural effusions.  -order cell count and cytology if she requires repeat thoracentesis  Hyponatremia Na improved this AM to 133. K 3.4, replete K.  -repleted K  -consider further work-up if Na<120   FEN/GI: regular PPx: on eliquis  Dispo: Back to SNF rehab.   Subjective:  Tachycardic yesterday evening, improved with metoprolol. Otherwise no acute events overnight. Seen with cardiology at bedside. Denies any new chest pain, SOB. Is anxious to go back to SNF.   Objective: Temp:  [97.7 F (36.5 C)-98.8 F (37.1 C)] 97.7 F (36.5 C) (03/15 0346) Pulse Rate:  [81-105] 86 (03/15 0346) Resp:  [19-25] 19 (03/15 0346) BP: (95-131)/(62-115) 113/76 (03/15 0346) SpO2:  [90 %-95 %] 94 % (03/15 0346)  Physical Exam: General: alert, in no acute distress Cardiovascular: irregular rhythm. Regular rate. No m/r/g. Respiratory: CTAB. Normal WOB on RA.  Abdomen: soft, non-tender, non-distended Extremities: Minimal edema BLE.   Laboratory: Most recent CBC Lab Results  Component Value Date   WBC 12.0 (H) 06/29/2022   HGB 11.5 (L) 06/29/2022   HCT 34.7 (L) 06/29/2022   MCV 88.3 06/29/2022   PLT 443 (H) 06/29/2022   Most recent BMP    Latest Ref Rng & Units 06/29/2022    7:36 AM  BMP  Glucose 70 - 99 mg/dL 94   BUN 8 - 23 mg/dL 14   Creatinine 0.44 -  1.00 mg/dL 0.91   Sodium 135 - 145 mmol/L 133   Potassium 3.5 - 5.1 mmol/L 3.4   Chloride 98 - 111 mmol/L 92   CO2 22 - 32 mmol/L 29   Calcium 8.9 - 10.3 mg/dL 9.4     Imaging/Diagnostic Tests: 3/14 CXR IMPRESSION: No acute cardiopulmonary process.  [name],  MD 06/29/2022, 9:40 AM  [id number], [place] Intern pager: [phone], text pages welcome Secure chat group [employer]

## 2022-06-29 NOTE — Care Management Important Message (Signed)
Important Message  Patient Details  Name: Sheena Taylor MRN: [id number] Date of Birth: 02-Dec-1934   Medicare Important Message Given:        Orbie Pyo 06/29/2022, 1:36 PM

## 2022-06-29 NOTE — Discharge Summary (Signed)
[facility] Discharge Summary  Patient name: Sheena Taylor Medical record number: [id number] Date of birth: 1934/09/28 Age: 88 y.o. Gender: female Date of Admission: 06/25/2022  Date of Discharge: 06/29/22 Admitting Physician: Ezequiel Essex, MD  Primary Care Provider: Garwin Brothers, MD Consultants: cardiology  Indication for Hospitalization: Afib with RVR  Brief Hospital Course:  Sheena Taylor is a 88 y.o. female with a PMHx of HTN, paroxysmal Afib, cardiac tamponade requiring emergency pericardiocentesis 05/2022, bilateral pleural effusion requiring Rt sided thoracentesis 05/2022 presenting with shortness of breath and fatigue for the last 3 days due to recurrent pleural effusion thought to be due to interplay between Afib RVR and possibly underlying HFpEF.   Afib with RVR HFpEF Cardiology followed throughout her admission. RVR thought to be driven by worsening pleural effusions and she was not thought to be hypertensive enough for emergency cardioversion and she was also thought to be at elevated risk of stroke given recent interruption in anticoagulation. She was not thought to be stable enough to perform TEE cardioversion on admission given her respiratory status and pleural effusions and she was started on IV amiodarone for Afib and continued on her eliquis, home metoprolol and home diltiazem. Rate became controlled. She was transitioned to PO amiodarone prior to discharge. Echo showed LVEF 65-70%, right ventricular volume overload (flattened IV septum in diastole). Considered TEE guided cardioversion however patient was reluctant and no emergent need at this time given rate controlled. She was started on jardiance 10 daily. She had elevated HR (110s-120s) on 3/14 and was given metoprolol. Her HR decreased at time of discharge. She was net -7.4L for her hospital stay. Her metoprolol was also decreased at time of discharge.   Pleural effusion Likely  cause of shortness of breath, reaccumulation of fluid seen on CXR in ED. Cardiology suspect interplay between Afib RVR and underlying HFpEF. SpO2 stable on room air at time of discharge. She was started on IV lasix 40 BID with good urine output. Repeat CXRs during admission with decreased effusions noted.   Pericardial effusion Trace pericardial effusion seen on bedside ultrasound in ED. Given previous recent pericardial effusion with tamponade requiring emergent pericardiocentesis and the fact that patient is now back on Eliquis, obtained complete echo for surveillance which showed small pericardial effusion which cardiology felt was stable.    Leukocytosis Persistent leukocytosis. Last pleural sample with albumin <1.5, protein <3, glucose 114, suspected transudative. No growth on culture. No cell count or cytology. Pericardial effusion without malignant cells. Wonder if malignancy may be causing recurrent pleural effusions. Did not require repeat thoracentesis but would consider cell count and cytology if she requires thoracentesis at a later date.   Hyponatremia  She was hyponatremic on admission that was thought to be dilutional and improved with diuresis but continued to be hyponatremic in spite of diuresis which was thought to be depletional. IV lasix was held 3/14. Her hyponatremia was stable at time of discharge   Items for PCP follow-up Follow up leukocytosis (WBC), consider hem/onc referral Cardiology follow-up to consider TEE/cardioversion and adding spironolactone Follow up hyponatremia  Monitor blood pressure, discontinued HCTZ and lisinopril due to hypotension during admission, cardiology decreased metoprolol prior to discharge, okay to take if SBP>90  Discharge Diagnoses/Problem List:  * Pleural effusion Pericardial effusion Atrial fibrillation with RVR ([place]) Hypotension Acute on chronic heart failure with preserved ejection fraction (HFpEF)  ([place]) Leukocytosis Hyponatremia    Disposition: [facility]   Discharge Condition: stable  Discharge Exam:  General: alert, in no acute distress Cardiovascular: irregular rhythm. Regular rate. No m/r/g. Respiratory: CTAB. Normal WOB on RA.  Abdomen: soft, non-tender, non-distended Extremities: Minimal edema BLE.   Significant Procedures: None  Significant Labs and Imaging:  Recent Labs  Lab 06/28/22 0217 06/29/22 0736  WBC 11.2* 12.0*  HGB 10.5* 11.5*  HCT 31.9* 34.7*  PLT 403* 443*   Recent Labs  Lab 06/28/22 0217 06/29/22 0736  NA 130* 133*  K 3.2* 3.4*  CL 88* 92*  CO2 33* 29  GLUCOSE 109* 94  BUN 16 14  CREATININE 1.01* 0.91  CALCIUM 8.8* 9.4   DG CHEST PORT 1 VIEW  Result Date: 06/28/2022 CLINICAL DATA:  Pleural effusion EXAM: PORTABLE CHEST 1 VIEW COMPARISON:  None Available. FINDINGS: Normal mediastinum and cardiac silhouette. Normal pulmonary vasculature. No evidence of effusion, infiltrate, or pneumothorax. No acute bony abnormality. IMPRESSION: No acute cardiopulmonary process. Electronically Signed   By: Suzy Bouchard M.D.   On: 06/28/2022 09:25   DG Chest 2 View  Result Date: 06/27/2022 CLINICAL DATA:  21 female with history of pleural effusion. EXAM: CHEST - 2 VIEW COMPARISON:  06/26/2022, 06/25/2018 FINDINGS: The mediastinal contours are within normal limits. Unchanged cardiomegaly. Hazy bibasilar opacities. Trace right pleural effusion. No focal consolidation or pneumothorax. No acute osseous abnormality. IMPRESSION: 1. Trace right pleural effusion. 2. Hazy bibasilar opacities, likely atelectasis. 3. Unchanged cardiomegaly. Electronically Signed   By: Ruthann Cancer M.D.   On: 06/27/2022 08:16   DG CHEST PORT 1 VIEW  Result Date: 06/26/2022 CLINICAL DATA:  Pleural effusion EXAM: PORTABLE CHEST 1 VIEW COMPARISON:  06/25/2022 FINDINGS: Cardiomegaly and aortic atherosclerosis as seen previously. Bilateral effusions right larger than left  with layering. Mild bibasilar atelectasis. Upper lobes remain clear. No discernible change since yesterday. IMPRESSION: No change since yesterday. Bilateral effusions right larger than left with bibasilar atelectasis. Electronically Signed   By: Nelson Chimes M.D.   On: 06/26/2022 12:29   ECHOCARDIOGRAM COMPLETE  Result Date: 06/26/2022    ECHOCARDIOGRAM REPORT   Patient Name:   Sheena Taylor Date of Exam: 06/26/2022 Medical Rec #:  [id number]           Height:       63.0 in Accession #:    [id number]          Weight:       152.0 lb Date of Birth:  [birth year]           BSA:          1.721 m Patient Age:    87 years            BP:           121/54 mmHg Patient Gender: F                   HR:           111 bpm. Exam Location:  Inpatient Procedure: 2D Echo, Cardiac Doppler and Color Doppler Indications:    dyspnea  History:        Patient has prior history of Echocardiogram examinations.                  FINDINGS  Left Ventricle: Left ventricular ejection fraction, by estimation, is 65 to 70%. The left ventricle has normal function. The left ventricle has no regional wall motion abnormalities. The left ventricular internal cavity size was normal in size. There is  no left ventricular hypertrophy. The interventricular septum is  flattened in diastole ('D' shaped left ventricle), consistent with right ventricular volume overload. Left ventricular diastolic parameters are indeterminate. Right Ventricle: The right ventricular size is is dilated. No increase in right ventricular wall thickness. Right ventricular systolic function is normal. There is mildly elevated pulmonary artery systolic pressure. The tricuspid regurgitant velocity is 2.66 m/s, and with an assumed right atrial pressure of 15 mmHg, the estimated right ventricular systolic pressure is 0000000 mmHg. Left Atrium: Left atrial size was severely dilated. Right Atrium: Right atrial size was mildly dilated. Pericardium: A small pericardial effusion is present.  The pericardial effusion is circumferential and posterior to the left ventricle. Mitral Valve: The mitral valve is normal in structure. Mild mitral valve regurgitation. No evidence of mitral valve stenosis. Tricuspid Valve: Eccentric tricuspid regurgitation. The tricuspid valve is normal in structure. Tricuspid valve regurgitation is mild to moderate. Aortic Valve: The aortic valve is tricuspid. There is mild calcification of the aortic valve. There is mild thickening of the aortic valve. Aortic valve regurgitation is not visualized. No aortic stenosis is present. Pulmonic Valve: The pulmonic valve was not well visualized. Pulmonic valve regurgitation is not visualized. No evidence of pulmonic stenosis. Aorta: The aortic root and ascending aorta are structurally normal, with no evidence of dilitation. Venous: The inferior vena cava is dilated in size with less than 50% respiratory variability, suggesting right atrial pressure of 15 mmHg. IAS/Shunts: The atrial septum is grossly normal.  LEFT VENTRICLE PLAX 2D LVIDd:         4.40 cm     Diastology LVIDs:         2.70 cm     LV e' medial:    10.90 cm/s LV PW:         1.00 cm     LV E/e' medial:  10.4 LV IVS:        1.00 cm     LV e' lateral:   12.20 cm/s LVOT diam:     2.00 cm     LV E/e' lateral: 9.3 LV SV:         51 LV SV Index:   30 LVOT Area:     3.14 cm  LV Volumes (MOD) LV vol d, MOD A2C: 69.2 ml LV vol d, MOD A4C: 73.8 ml LV vol s, MOD A2C: 25.3 ml LV vol s, MOD A4C: 22.6 ml LV SV MOD A2C:     43.9 ml LV SV MOD A4C:     73.8 ml LV SV MOD BP:      48.1 ml RIGHT VENTRICLE            IVC RV Basal diam:  3.90 cm    IVC diam: 2.10 cm RV S prime:     9.03 cm/s TAPSE (M-mode): 1.2 cm LEFT ATRIUM             Index        RIGHT ATRIUM           Index LA diam:        4.10 cm 2.38 cm/m   RA Area:     22.30 cm LA Vol (A2C):   94.2 ml 54.74 ml/m  RA Volume:   65.10 ml  37.83 ml/m LA Vol (A4C):   97.5 ml 56.66 ml/m LA Biplane Vol: 96.0 ml 55.79 ml/m  AORTIC VALVE  LVOT Vmax:   91.80 cm/s LVOT Vmean:  53.700 cm/s LVOT VTI:    0.162 m  AORTA Ao Root diam: 3.00 cm Ao  Asc diam:  3.20 cm MITRAL VALVE                  TRICUSPID VALVE MV Area (PHT): 3.93 cm       TR Peak grad:   28.3 mmHg MV Decel Time: 193 msec       TR Vmax:        266.00 cm/s MR Peak grad:    95.3 mmHg MR Mean grad:    62.0 mmHg    SHUNTS MR Vmax:         488.00 cm/s  Systemic VTI:  0.16 m MR Vmean:        374.0 cm/s   Systemic Diam: 2.00 cm MR PISA:         1.01 cm MR PISA Eff ROA: 7 mm MR PISA Radius:  0.40 cm MV E velocity: 113.00 cm/s Rudean Haskell MD Electronically signed by Rudean Haskell MD Signature Date/Time: 06/26/2022/10:49:41 AM    Final    DG Chest Port 1 View  Result Date: 06/25/2022 CLINICAL DATA:  Hypotension and dizziness. EXAM: PORTABLE CHEST 1 VIEW COMPARISON:  06/12/2022. FINDINGS: Trachea is midline. Heart is enlarged. Thoracic aorta is calcified. Enlarging bilateral pleural effusions, moderate on the right and small on the left. Mild bibasilar mixed interstitial and airspace opacification, similar. IMPRESSION: 1. Enlarging bilateral pleural effusions, moderate on the right and small on the left. 2. Bibasilar atelectasis. Electronically Signed   By: Lorin Picket M.D.   On: 06/25/2022 09:43   Results/Tests Pending at Time of Discharge: None  Discharge Medications:  Allergies as of 06/29/2022       Reactions   Codeine Nausea And Vomiting        Medication List     STOP taking these medications    diltiazem 240 MG 24 hr capsule Commonly known as: CARDIZEM CD   hydrochlorothiazide 25 MG tablet Commonly known as: HYDRODIURIL   lisinopril 20 MG tablet Commonly known as: ZESTRIL   loperamide 2 MG capsule Commonly known as: IMODIUM       TAKE these medications    amiodarone 200 MG tablet Commonly known as: PACERONE Take 1 tablet (200 mg total) by mouth 2 (two) times daily. What changed: when to take this   ascorbic acid 500 MG  tablet Commonly known as: VITAMIN C Take 500 mg by mouth daily after breakfast.   diltiazem 120 MG 12 hr capsule Commonly known as: CARDIZEM SR Take 1 capsule (120 mg total) by mouth every 12 (twelve) hours.   Eliquis 5 MG Tabs tablet Generic drug: apixaban TAKE 1 TABLET BY MOUTH TWICE A DAY What changed: how much to take   empagliflozin 10 MG Tabs tablet Commonly known as: JARDIANCE Take 1 tablet (10 mg total) by mouth daily. Start taking on: June 30, 2022   metoprolol tartrate 25 MG tablet Commonly known as: LOPRESSOR Take 1 tablet (25 mg total) by mouth 2 (two) times daily. What changed:  medication strength how much to take   multivitamin with minerals tablet Take 1 tablet by mouth daily.   PreserVision AREDS Caps Take 1 capsule by mouth in the morning and at bedtime.   pantoprazole sodium 40 mg Commonly known as: PROTONIX Take 40 mg by mouth in the morning. ORAL PACKET What changed: Another medication with the same name was removed. Continue taking this medication, and follow the directions you see here.   pravastatin 20 MG tablet Commonly known as: PRAVACHOL Take 20 mg by mouth every evening.  Discharge Instructions: Please refer to Patient Instructions section of EMR for full details.  Patient was counseled important signs and symptoms that should prompt return to medical care, changes in medications, dietary instructions, activity restrictions, and follow up appointments.   Follow-Up Appointments:  Follow-up Information     Garwin Brothers, MD. Call in 3 day(s).   Specialty: Internal Medicine Contact information: [address] [place] [address] [phone]         Nigel Mormon, MD. Go on 07/03/2022.   Specialties: Cardiology, Radiology Why: Follow-up appointment scheduled at 1pm with Dr. Virgina Jock. Contact information: [place] [address] [phone]                 [name], MD 06/29/2022, 1:19 PM PGY-1, [place]

## 2022-06-30 ENCOUNTER — Emergency Department (HOSPITAL_COMMUNITY)
Admission: EM | Admit: 2022-06-30 | Discharge: 2022-06-30 | Disposition: A | Discharge status: Home / Self Care | Payer: Medicare Other | Attending: Emergency Medicine | Admitting: Emergency Medicine

## 2022-06-30 ENCOUNTER — Other Ambulatory Visit: Payer: Self-pay

## 2022-06-30 ENCOUNTER — Emergency Department (HOSPITAL_COMMUNITY): Payer: Medicare Other

## 2022-06-30 DIAGNOSIS — R0602 Shortness of breath: Secondary | ICD-10-CM | POA: Diagnosis not present

## 2022-06-30 DIAGNOSIS — R531 Weakness: Secondary | ICD-10-CM | POA: Insufficient documentation

## 2022-06-30 DIAGNOSIS — R5383 Other fatigue: Secondary | ICD-10-CM | POA: Insufficient documentation

## 2022-06-30 DIAGNOSIS — Z7901 Long term (current) use of anticoagulants: Secondary | ICD-10-CM | POA: Diagnosis not present

## 2022-06-30 DIAGNOSIS — I951 Orthostatic hypotension: Secondary | ICD-10-CM

## 2022-06-30 LAB — CBC WITH DIFFERENTIAL/PLATELET
Abs Immature Granulocytes: 0.16 10*3/uL — ABNORMAL HIGH (ref 0.00–0.07)
Basophils Absolute: 0.2 10*3/uL — ABNORMAL HIGH (ref 0.0–0.1)
Basophils Relative: 1 %
Eosinophils Absolute: 0.7 10*3/uL — ABNORMAL HIGH (ref 0.0–0.5)
Eosinophils Relative: 6 %
HCT: 33.7 % — ABNORMAL LOW (ref 36.0–46.0)
Hemoglobin: 10.8 g/dL — ABNORMAL LOW (ref 12.0–15.0)
Immature Granulocytes: 1 %
Lymphocytes Relative: 10 %
Lymphs Abs: 1.3 10*3/uL (ref 0.7–4.0)
MCH: 29.2 pg (ref 26.0–34.0)
MCHC: 32 g/dL (ref 30.0–36.0)
MCV: 91.1 fL (ref 80.0–100.0)
Monocytes Absolute: 0.9 10*3/uL (ref 0.1–1.0)
Monocytes Relative: 7 %
Neutro Abs: 9.7 10*3/uL — ABNORMAL HIGH (ref 1.7–7.7)
Neutrophils Relative %: 75 %
Platelets: 475 10*3/uL — ABNORMAL HIGH (ref 150–400)
RBC: 3.7 MIL/uL — ABNORMAL LOW (ref 3.87–5.11)
RDW: 14.3 % (ref 11.5–15.5)
WBC: 12.9 10*3/uL — ABNORMAL HIGH (ref 4.0–10.5)
nRBC: 0 % (ref 0.0–0.2)

## 2022-06-30 LAB — COMPREHENSIVE METABOLIC PANEL
ALT: 36 U/L (ref 0–44)
AST: 39 U/L (ref 15–41)
Albumin: 2.7 g/dL — ABNORMAL LOW (ref 3.5–5.0)
Alkaline Phosphatase: 75 U/L (ref 38–126)
Anion gap: 12 (ref 5–15)
BUN: 18 mg/dL (ref 8–23)
CO2: 26 mmol/L (ref 22–32)
Calcium: 9.2 mg/dL (ref 8.9–10.3)
Chloride: 93 mmol/L — ABNORMAL LOW (ref 98–111)
Creatinine, Ser: 1.14 mg/dL — ABNORMAL HIGH (ref 0.44–1.00)
GFR, Estimated: 47 mL/min — ABNORMAL LOW (ref >60)
Glucose, Bld: 117 mg/dL — ABNORMAL HIGH (ref 70–99)
Potassium: 3.7 mmol/L (ref 3.5–5.1)
Sodium: 131 mmol/L — ABNORMAL LOW (ref 135–145)
Total Bilirubin: 0.7 mg/dL (ref 0.3–1.2)
Total Protein: 6.1 g/dL — ABNORMAL LOW (ref 6.5–8.1)

## 2022-06-30 LAB — CULTURE, BLOOD (ROUTINE X 2)
Culture: NO GROWTH
Culture: NO GROWTH
Special Requests: ADEQUATE
Special Requests: ADEQUATE

## 2022-06-30 LAB — TROPONIN I (HIGH SENSITIVITY): Troponin I (High Sensitivity): 4 ng/L (ref <18)

## 2022-06-30 LAB — BRAIN NATRIURETIC PEPTIDE: B Natriuretic Peptide: 252.8 pg/mL — ABNORMAL HIGH (ref 0.0–100.0)

## 2022-06-30 NOTE — ED Triage Notes (Signed)
Pt BIB EMS from [facility]. Per EMS, pt recently admitted to the hospital for weakness and discharged yesterday. Pt continues to have generalized weakness. A/Ox4.

## 2022-06-30 NOTE — Progress Notes (Signed)
   06/30/22 1145  Spiritual Encounters  Type of Visit Initial  Care provided to: [facility] partners present during encounter Nurse  Referral source Nurse (RN/NT/LPN)  Reason for visit Routine spiritual support  OnCall Visit Yes  Spiritual Framework  Presenting Themes Community and relationships  Community/Connection Family  Patient Stress Factors None identified  Family Stress Factors None identified  Interventions  Spiritual Care Interventions Made Compassionate presence;Reflective listening  Intervention Outcomes  Outcomes Connection to spiritual care   Visited patient in ED, originally unavailable. Located spouse in waiting room, sat with spouse for conversation. Checked with nurse to see if patient ready for visitors. Patient was ready. Escorted spouse to patient's room. Introduced self to patient. Exchanged hellos, asked if there was anything else I could do for them. Advised not and thanked. Advised patient and spouse if they needed anything contact the chaplain's office.

## 2022-06-30 NOTE — Consult Note (Signed)
Hospital Admission History and Physical Service Pager: [phone]  Patient name: Sheena Taylor Medical record number: [id number] Date of Birth: Apr 07, 1935 Age: 88 y.o. Gender: female  Primary Care Provider: [place], [place]  Chief Complaint: Weakness  Assessment and Plan: Sheena Taylor is a 88 y.o. female presenting with weakness and dizziness in the setting of likely orthostatic hypotension and deconditioning from recent hospitalization.  Weakness Episode of weakness and dizziness that began after rising from bed to go to the bathroom this AM. Initial BP in 70s at ALF. Recently hospitalized from 3/11-3/15, discharge yesterday evening at baseline and states she slept well overnight. In the ED patient initial BP in 70s but increased to 100s without intervention. Asymptomatic at the time of interview. Per patient and chart review BP regularly in 90s. Episode appears to be in the setting of orthostasis and deconditioning secondary to recent hospitalization. Patient with 24/7 support at ALF and receiving acute rehab services. Upon lengthy discussion with patient, she opted to return to ALF as she felt better and wanted to continue with more extensive rehab there to gain strength. -Stop home Metoprolol, OP Cardiology instruction to only give if SBP >90 but will hold until OP Cardiology follow up on 3/19 -Return precautions given including syncope, chest pain and worsening weakness   History of Present Illness:  Sheena Taylor is a 88 y.o. female presenting with generalized weakness and dizziness.  Patient recently admitted to [facility] service from 3/11-3/15 for SOB and fatigue that had resolved at the time of discharge. Patient states she sate dinner and slept well after discharge. She then woke up this morning feeling weak and dizzy when getting out of bed. Patient said she went to the bathroom using her walker and felt like she needed to  sit down. Was assisted back to the chair in the room. Husband states they took her BP at that time and it was in the 2s. Denies any chest pain or palpitations during that time. Endorses taking her medication at home this AM  Currently asymptomatic and states her weakness and dizziness has resolved. Per husband patient has historical had low Bps with multiple readings in the 90s during recent hospital stay.  In the ED, initial BP reading mildly low but increased to 100s. Lab and imaging work up   Review Of Systems: Per HPI above  Pertinent Past Medical History: A-fib with RVR Acute idiopathic pericarditis Cardiac tamponade Hypertension Macular degeneration Remainder reviewed in history tab.   Pertinent Past Surgical History: Bilateral excisional breast biopsy 1992 Laparotomy for repair ectopic pregnancy Partial abdominal hysterectomy Amputation second right toe Right total knee arthroplasty 2016 Left total knee arthroplasty 2020 Pericardiocentesis 2024 Remainder reviewed in history tab.   Pertinent Social History: Tobacco use: None Alcohol use: Every other day, 1-2 glasses of wine per episode Other Substance use: None Lives with husband in independent living at [facility]  Pertinent Family History: Mother: heart disease Remainder reviewed in history tab.   Important Outpatient Medications: Amiodarone 200 mg daily Diltiazem 12-hour capsule 120 mg BID Eliquis 5 mg twice daily (of note, held for pericardiocentesis in January) Jardiance 10mg  daily Metoprolol 50 mg tablets BID Multivitamin Pantoprazole 40 mg Pravastatin 20 mg daily Remainder reviewed in medication history.   Objective: BP 109/72   Pulse (!) 102   Temp (!) 97.3 F (36.3 C) (Oral)   Resp 17   Ht 5\' 3"  (1.6 m)   Wt 68.9 kg  SpO2 97%   BMI 26.91 kg/m  Exam: General: Elderly female, turning in bed, alert, NAD Eyes: PERRLA, anicteric sclera ENTM: Dry lips, moist oral mucosa Neck: Supple,  non-tender Cardiovascular: Irregularly irregular rhythm (h/o chronic afib). Rate controlled. Without murmur Respiratory: CTAB. Normal WOB on RA Gastrointestinal: Soft, non-tender, non-distended MSK: Minimal peripheral edema Derm: Warm, dry. Sacral decubitus ulcer (not visualized) Neuro: CN intact. Motor and sensation intact globally. 5/5 grip strength and hip flexion. Psych: Cooperative, pleasant, mildly tearful due to recent health concerns  Labs:  CBC BMET  Recent Labs  Lab 06/30/22 1143  WBC 12.9*  HGB 10.8*  HCT 33.7*  PLT 475*   Recent Labs  Lab 06/30/22 1143  NA 131*  K 3.7  CL 93*  CO2 26  BUN 18  CREATININE 1.14*  GLUCOSE 117*  CALCIUM 9.2    Pertinent additional labs: BNP: 252.8 Troponin: 4  EKG: Significant artifact. Afib and possible QT prolongation   Imaging Studies Performed: DG Chest Port 1 View  Result Date: 06/30/2022 IMPRESSION: Cardiomegaly without acute abnormality of the lungs.    Colletta Maryland, MD 06/30/2022, 5:46 PM PGY-1, [place] Intern pager: [phone], text pages welcome Secure chat group [place]

## 2022-06-30 NOTE — ED Provider Notes (Addendum)
[facility] Provider Note   CSN: [id number] Arrival date & time: 06/30/22  1125     History  Chief Complaint  Patient presents with   Weakness    Generalized     Sheena Taylor is a 88 y.o. female.  88 yo F with a chief complaint of feeling fatigued lightheaded like she might pass out.  Tells me that this has been going on today.  Worse when she gets up and tries to move around.  Was noted to have a lower blood pressure at her facility and EMS was called.  Was just in the hospital for about 4 days and discharged yesterday.  She denies infectious symptoms cough congestion fever.  Denies chest pain.  Has had some ongoing shortness of breath.   Weakness      Home Medications Prior to Admission medications   Medication Sig Start Date End Date Taking? Authorizing Provider  acetaminophen (TYLENOL) 500 MG tablet Take 1,000 mg by mouth every 8 (eight) hours as needed (pain).   Yes [provider]  amiodarone (PACERONE) 200 MG tablet Take 1 tablet (200 mg total) by mouth 2 (two) times daily. Patient taking differently: Take 200 mg by mouth daily. 06/29/22  Yes Paige, Victoria J, DO  diltiazem (CARDIZEM SR) 120 MG 12 hr capsule Take 1 capsule (120 mg total) by mouth every 12 (twelve) hours. Patient taking differently: Take 120 mg by mouth 2 (two) times daily. 06/12/22  Yes Patwardhan, Manish J, MD  ELIQUIS 5 MG TABS tablet TAKE 1 TABLET BY MOUTH TWICE A DAY Patient taking differently: Take 5 mg by mouth 2 (two) times daily. 03/07/21  Yes Cantwell, Celeste C, PA-C  empagliflozin (JARDIANCE) 10 MG TABS tablet Take 1 tablet (10 mg total) by mouth daily. 06/30/22  Yes Paige, Weldon Picking, DO  metoprolol tartrate (LOPRESSOR) 50 MG tablet Take 50 mg by mouth 2 (two) times daily.   Yes [provider]  Multiple Vitamins-Minerals (MULTIVITAMIN WITH MINERALS) tablet Take 1 tablet by mouth every evening.   Yes [provider]   Multiple Vitamins-Minerals (PRESERVISION AREDS) CAPS Take 1 capsule by mouth in the morning and at bedtime.   Yes [provider]  pantoprazole sodium (PROTONIX) 40 mg Take 40 mg by mouth daily. Pantoprazole 40 mg oral packet.   Yes [provider]  pravastatin (PRAVACHOL) 20 MG tablet Take 20 mg by mouth every evening.   Yes [provider]  vitamin C (ASCORBIC ACID) 500 MG tablet Take 500 mg by mouth daily.   Yes [provider]  metoprolol tartrate (LOPRESSOR) 25 MG tablet Take 1 tablet (25 mg total) by mouth 2 (two) times daily. Patient not taking: Reported on 06/30/2022 06/29/22   Shary Key, DO      Allergies    Codeine    Review of Systems   Review of Systems  Neurological:  Positive for weakness.    Physical Exam Updated Vital Signs BP (!) 104/58   Pulse 84   Temp (!) 97.3 F (36.3 C) (Oral)   Resp (!) 31   Ht 5\' 3"  (1.6 m)   Wt 68.9 kg   SpO2 97%   BMI 26.91 kg/m  Physical Exam Vitals and nursing note reviewed.  Constitutional:      General: She is not in acute distress.    Appearance: She is well-developed. She is not diaphoretic.  HENT:     Head: Normocephalic and atraumatic.  Eyes:  Pupils: Pupils are equal, round, and reactive to light.  Cardiovascular:     Rate and Rhythm: Normal rate and regular rhythm.     Heart sounds: No murmur heard.    No friction rub. No gallop.  Pulmonary:     Effort: Pulmonary effort is normal.     Breath sounds: No wheezing or rales.  Abdominal:     General: There is no distension.     Palpations: Abdomen is soft.     Tenderness: There is no abdominal tenderness.  Musculoskeletal:        General: No tenderness.     Cervical back: Normal range of motion and neck supple.  Skin:    General: Skin is warm and dry.  Neurological:     Mental Status: She is alert and oriented to person, place, and time.  Psychiatric:        Behavior: Behavior normal.     ED Results / Procedures /  Treatments   Labs (all labs ordered are listed, but only abnormal results are displayed) Labs Reviewed  CBC WITH DIFFERENTIAL/PLATELET - Abnormal; Notable for the following components:      Result Value   WBC 12.9 (*)    RBC 3.70 (*)    Hemoglobin 10.8 (*)    HCT 33.7 (*)    Platelets 475 (*)    Neutro Abs 9.7 (*)    Eosinophils Absolute 0.7 (*)    Basophils Absolute 0.2 (*)    Abs Immature Granulocytes 0.16 (*)    All other components within normal limits  COMPREHENSIVE METABOLIC PANEL - Abnormal; Notable for the following components:   Sodium 131 (*)    Chloride 93 (*)    Glucose, Bld 117 (*)    Creatinine, Ser 1.14 (*)    Total Protein 6.1 (*)    Albumin 2.7 (*)    GFR, Estimated 47 (*)    All other components within normal limits  BRAIN NATRIURETIC PEPTIDE - Abnormal; Notable for the following components:   B Natriuretic Peptide 252.8 (*)    All other components within normal limits  TROPONIN I (HIGH SENSITIVITY)    EKG EKG Interpretation  Date/Time:  Saturday June 30 2022 11:36:00 EDT Ventricular Rate:  60 PR Interval:    QRS Duration: 105 QT Interval:  659 QTC Calculation: 653 R Axis:   -43 Text Interpretation: Atrial fibrillation Left axis deviation Probable anteroseptal infarct, old Borderline T abnormalities, inferior leads Prolonged QT interval Artifact in lead(s) I III aVR aVL aVF Since last tracing rate slower background noise TECHNICALLY DIFFICULT Otherwise no significant change Confirmed by Deno Etienne [phone]) on 06/30/2022 11:49:49 AM  Radiology DG Chest Port 1 View  Result Date: 06/30/2022 CLINICAL DATA:  Fatigue EXAM: PORTABLE CHEST 1 VIEW COMPARISON:  06/28/2022 FINDINGS: Cardiomegaly. Both lungs are clear. The visualized skeletal structures are unremarkable. IMPRESSION: Cardiomegaly without acute abnormality of the lungs. Electronically Signed   By: Delanna Ahmadi M.D.   On: 06/30/2022 12:32    Procedures .Critical Care  Performed by: Deno Etienne,  DO Authorized by: Deno Etienne, DO   Critical care provider statement:    Critical care time (minutes):  35   Critical care time was exclusive of:  Separately billable procedures and treating other patients   Critical care was time spent personally by me on the following activities:  Development of treatment plan with patient or surrogate, discussions with consultants, evaluation of patient's response to treatment, examination of patient, ordering and review of laboratory studies,  ordering and review of radiographic studies, ordering and performing treatments and interventions, pulse oximetry, re-evaluation of patient's condition and review of old charts     Medications Ordered in ED Medications - No data to display  ED Course/ Medical Decision Making/ A&P                             Medical Decision Making Amount and/or Complexity of Data Reviewed Labs: ordered. Radiology: ordered.   88 yo F with a chief complaints of fatigue shortness of breath and feeling like she might pass out.  This has been going on for the past day.  On my record review she was just discharged from the hospital.  But admitted with pericardial effusion that required drainage and then presented back to the hospital with A-fib and heart failure.  She was discharged from her second admission yesterday.  Tells me that she still does not feel well.  Has trouble quantifying this.  Her initial blood pressure was 79/62 though improved without intervention.  Will obtain a laboratory evaluation.  Chest x-ray.  Reassess.  Chest x-ray independently interpreted by me without obvious significant change to her prior effusion.  Troponins negative, BNP at baseline.  No significant change to her hemoglobin, no significant electrolyte abnormality.  I discussed the case with family medicine who had recently discharged the patient.  They came and evaluated her at bedside.  After a long discussion with her and the family they would like to  try and go home and continue rehab at her facility.  They have scheduled follow-up on Tuesday.  They do understand to return anytime she worsens.  2:34 PM:  I have discussed the diagnosis/risks/treatment options with the patient and family.  Evaluation and diagnostic testing in the emergency department does not suggest an emergent condition requiring admission or immediate intervention beyond what has been performed at this time.  They will follow up with PCP, cards. We also discussed returning to the ED immediately if new or worsening sx occur. We discussed the sx which are most concerning (e.g., sudden worsening pain, fever, inability to tolerate by mouth) that necessitate immediate return. Medications administered to the patient during their visit and any new prescriptions provided to the patient are listed below.  Medications given during this visit Medications - No data to display   The patient appears reasonably screen and/or stabilized for discharge and I doubt any other medical condition or other [facility] requiring further screening, evaluation, or treatment in the ED at this time prior to discharge.          Final Clinical Impression(s) / ED Diagnoses Final diagnoses:  Orthostasis    Rx / DC Orders ED Discharge Orders     None         Deno Etienne, DO 06/30/22 [place], Martin, DO 06/30/22 1434

## 2022-06-30 NOTE — ED Notes (Signed)
Got patient on the monitor did EKG shown to Dr Tyrone Nine patient is resting with call bell in reach

## 2022-06-30 NOTE — ED Notes (Signed)
Pt provided with drink and water at this time

## 2022-06-30 NOTE — ED Notes (Signed)
[facility] called, no ETA given ?

## 2022-06-30 NOTE — Discharge Instructions (Signed)
Please return at any time you are concerned or have worsening chest pain or difficulty breathing.  Please follow-up with your cardiologist in the office.

## 2022-06-30 NOTE — Assessment & Plan Note (Addendum)
Episode of weakness and dizziness that began after rising from bed to go to the bathroom this AM. Initial BP in 70s at ALF. Recently hospitalized from 3/11-3/15, discharge yesterday evening at baseline and states she slept well overnight. In the ED patient initial BP in 70s but increased to 100s without intervention. Asymptomatic at the time of interview. Per patient and chart review BP regularly in 90s. Episode appears to be in the setting of orthostasis and deconditioning secondary to recent hospitalization. Patient with 24/7 support at ALF and receiving acute rehab services. Upon lengthy discussion with patient, she opted to return to ALF as she felt better and wanted to continue with more extensive rehab there to gain strength. -Stop home Metoprolol, OP Cardiology instruction to only give if SBP >90 but will hold until OP Cardiology follow up on 3/19 -Return precautions given including syncope, chest pain and worsening weakness

## 2022-06-30 NOTE — ED Notes (Signed)
Admitting MD at bedside.

## 2022-07-03 ENCOUNTER — Ambulatory Visit: Payer: Medicare Other | Admitting: Internal Medicine

## 2022-07-03 ENCOUNTER — Ambulatory Visit: Payer: Medicare Other | Admitting: Cardiology

## 2022-07-03 ENCOUNTER — Encounter: Payer: Self-pay | Admitting: Cardiology

## 2022-07-03 VITALS — BP 106/72 | HR 91 | Resp 15 | Ht 63.0 in | Wt 147.0 lb

## 2022-07-03 DIAGNOSIS — I4819 Other persistent atrial fibrillation: Secondary | ICD-10-CM

## 2022-07-03 DIAGNOSIS — I5032 Chronic diastolic (congestive) heart failure: Secondary | ICD-10-CM

## 2022-07-03 DIAGNOSIS — R072 Precordial pain: Secondary | ICD-10-CM

## 2022-07-03 NOTE — Progress Notes (Signed)
Follow up visit  Subjective:   Sheena Taylor, female    DOB: 1934-06-14, 88 y.o.   MRN: [id number]     HPI  Chief Complaint  Patient presents with   Chest Pain   Hospitalization Follow-up    88 y.o. year-old Caucasian female with hypertension, arthritis, paroxysmal atrial fibrillation, cardiac tamponade requiring emergency pericardiocentesis in 05/2022, recurrent bilateral pleural effusions requiring Rt sided thoracentesis in 05/2022.  Patient was initially hospitalized on 06/03/2022 with acute shortness of breath, A-fib with RVR.  She was found to be in cardiac tamponade, for which she underwent emergency pericardiocentesis with 600 cc of hemorrhagic fluid that came back negative for malignant cells on cytology.  Patient had rather prolonged hospital stay, during which she also required thoracentesis for bilateral pleural effusions.  She was discharged to [facility] facility on 06/12/2022.  Unfortunately, patient got readmitted on 06/25/2022 with worsening shortness of breath, A-fib with RVR.  This time, she did not have any significant pericardial effusion, but did have worsening bilateral pleural effusions.  Over the next 4 days, patient underwent aggressive diuresis, and brief IV amiodarone for A-fib rate control.  She was discharged on 06/29/2022 with significant improvement in her A-fib rate, and near resolution of bilateral effusions.  Given transudative nature of her pleural effusions, it was felt that the presentation was acute on chronic HFpEF in the setting of A-fib with RVR.  Unfortunately, patient was back in the ER on 06/30/2022 with lightheadedness symptoms.  It appears that she was taking metoprolol tartrate 75 mg twice daily, in place of 50 mg twice daily due to certain confusion regarding discharge medications.  She has since not taken any metoprolol.  Patient is here today with her husband and son.  She feels well, denies any complaints of lightheadedness, chest pain,  shortness of breath.  She also denies any leg edema symptoms.  Family have several questions today regarding the medications.  They want to know if she needs to take pravastatin given that she is concerned about side effects.  They also had questions about her current dose of Tylenol 1000 mg 3 times daily.  I went over patient's medication list from [facility] facility at length, and made changes as detailed below.    Current Outpatient Medications:    acetaminophen (TYLENOL) 500 MG tablet, Take 1,000 mg by mouth every 8 (eight) hours as needed (pain)., Disp: , Rfl:    amiodarone (PACERONE) 200 MG tablet, Take 1 tablet (200 mg total) by mouth 2 (two) times daily. (Patient taking differently: Take 200 mg by mouth daily.), Disp: 60 tablet, Rfl: 0   diltiazem (CARDIZEM SR) 120 MG 12 hr capsule, Take 1 capsule (120 mg total) by mouth every 12 (twelve) hours. (Patient taking differently: Take 120 mg by mouth 2 (two) times daily.), Disp: 60 capsule, Rfl: 1   ELIQUIS 5 MG TABS tablet, TAKE 1 TABLET BY MOUTH TWICE A DAY (Patient taking differently: Take 5 mg by mouth 2 (two) times daily.), Disp: 180 tablet, Rfl: 2   empagliflozin (JARDIANCE) 10 MG TABS tablet, Take 1 tablet (10 mg total) by mouth daily., Disp: 30 tablet, Rfl: 0   metoprolol tartrate (LOPRESSOR) 50 MG tablet, Take 50 mg by mouth 2 (two) times daily., Disp: , Rfl:    Multiple Vitamins-Minerals (MULTIVITAMIN WITH MINERALS) tablet, Take 1 tablet by mouth every evening., Disp: , Rfl:    Multiple Vitamins-Minerals (PRESERVISION AREDS) CAPS, Take 1 capsule by mouth in the morning and at bedtime.,  Disp: , Rfl:    pantoprazole sodium (PROTONIX) 40 mg, Take 40 mg by mouth daily. Pantoprazole 40 mg oral packet., Disp: , Rfl:    pravastatin (PRAVACHOL) 20 MG tablet, Take 20 mg by mouth every evening., Disp: , Rfl:    vitamin C (ASCORBIC ACID) 500 MG tablet, Take 500 mg by mouth daily., Disp: , Rfl:    Cardiovascular & other pertient  studies:  Reviewed external labs and tests, independently interpreted  EKG 07/03/2022: Atrial fibrillation 96 bpm  Nonspecific T-abnormality   Recent labs: 06/30/2022: Glucose 117, BUN/Cr 18/1.14. EGFR 47. Na/K 131/3.7. Albumin 2.7. Rest of the CMP normal BNP 252, trop HS 4 H/H 10.8/33.7. MCV 91. Platelets 475 HbA1C 5.9% TSH 0.38 normal    Review of Systems  Cardiovascular:  Negative for chest pain, dyspnea on exertion, leg swelling, palpitations and syncope.         Vitals:   07/03/22 1308  BP: 106/72  Pulse: 91  Resp: 15  SpO2: 95%    Body mass index is 26.04 kg/m. Filed Weights   07/03/22 1308  Weight: 147 lb (66.7 kg)     Objective:   Physical Exam Vitals and nursing note reviewed.  Constitutional:      General: She is not in acute distress. Neck:     Vascular: No JVD.  Cardiovascular:     Rate and Rhythm: Normal rate. Rhythm irregular.     Heart sounds: Normal heart sounds. No murmur heard. Pulmonary:     Effort: Pulmonary effort is normal.     Breath sounds: Normal breath sounds. No wheezing or rales.  Musculoskeletal:     Right lower leg: No edema.     Left lower leg: No edema.             Visit diagnoses:   ICD-10-CM   1. Precordial pain  R07.2 EKG 12-Lead       Orders Placed This Encounter  Procedures   EKG 12-Lead   Medications Discontinued During This Encounter  Medication Reason   metoprolol tartrate (LOPRESSOR) 25 MG tablet Patient Preference   pravastatin (PRAVACHOL) 20 MG tablet Discontinued by provider   metoprolol tartrate (LOPRESSOR) 50 MG tablet Discontinued by provider     Assessment & Recommendations:   88 y.o. year-old Caucasian female with hypertension, arthritis, paroxysmal atrial fibrillation, cardiac tamponade requiring emergency pericardiocentesis in 05/2022, recurrent bilateral pleural effusions requiring Rt sided thoracentesis in 05/2022.  Persistent A-fib: Rate well-controlled on diltiazem SR 120 mg  twice daily, and amiodarone 200 mg twice daily. I will reduce amiodarone down to 200 mg daily at her next visit in April 2024. Given borderline low blood pressure within the last few days, I will discontinue metoprolol tartrate altogether. I previously discussed TEE guided cardioversion with the patient given recent interruption of anticoagulation in February 24 for after her pericardial effusion.  However, patient has been reluctant to consider TEE cardioversion.  This is understandable.  Heart rate is well-controlled.  Amiodarone, in this case, is being used more for rate control than rhythm control. CHA2DS2VASc score at least 4, annual stroke risk 5%.  Continue Eliquis 5 mg twice daily.   HFpEF: Patient is well compensated and euvolemic on exam today. No clinical evidence of pleural effusion on my exam today. Continue Jardiance 10 mg daily, as long as systolic blood pressure [id number] mmHg. Hold Jardiance if systolic blood pressure less than 100 mmHg. Encourage patient to drink 2-3 L water every day. Recommend 2 g sodium restriction diet.  On a separate note, I do not think she needs to be on Tylenol 1000 mg 3 times daily.  I have recommended reduction to 500 mg 3 times daily as needed only.  I am okay with discontinuing pravastatin, and rather have her be compliant on the above medications to prevent recurrent hospitalization for heart failure or A-fib.  I went over the medication list from [facility] at length, and made changes as detailed above.  I will see her back again in April 2024 for close follow-up, with aim to avoid recurrent hospitalization.  Time spent: 45 minutes      Nigel Mormon, MD Pager: [phone] Office: [phone]

## 2022-07-23 ENCOUNTER — Ambulatory Visit
Admission: RE | Admit: 2022-07-23 | Discharge: 2022-07-23 | Disposition: A | Discharge status: Home / Self Care | Payer: Medicare Other | Source: Ambulatory Visit | Attending: Cardiology | Admitting: Cardiology

## 2022-07-23 ENCOUNTER — Ambulatory Visit: Payer: Medicare Other | Admitting: Cardiology

## 2022-07-23 ENCOUNTER — Other Ambulatory Visit (HOSPITAL_COMMUNITY): Payer: Self-pay

## 2022-07-23 ENCOUNTER — Other Ambulatory Visit: Payer: Self-pay

## 2022-07-23 ENCOUNTER — Encounter: Payer: Self-pay | Admitting: Cardiology

## 2022-07-23 VITALS — BP 94/56 | HR 104 | Resp 16 | Ht 63.0 in | Wt 147.6 lb

## 2022-07-23 DIAGNOSIS — R0609 Other forms of dyspnea: Secondary | ICD-10-CM

## 2022-07-23 DIAGNOSIS — I4819 Other persistent atrial fibrillation: Secondary | ICD-10-CM

## 2022-07-23 DIAGNOSIS — I5032 Chronic diastolic (congestive) heart failure: Secondary | ICD-10-CM

## 2022-07-23 DIAGNOSIS — I3139 Other pericardial effusion (noninflammatory): Secondary | ICD-10-CM

## 2022-07-23 MED ORDER — FUROSEMIDE 40 MG PO TABS
40.0000 mg | ORAL_TABLET | Freq: Every day | ORAL | 1 refills | Status: DC | PRN
  Filled 2022-07-23: qty 90, 90d supply, fill #0

## 2022-07-23 MED ORDER — DILTIAZEM HCL ER 120 MG PO CP12
120.0000 mg | ORAL_CAPSULE | Freq: Two times a day (BID) | ORAL | 2 refills | Status: DC
  Filled 2022-07-23: qty 120, 60d supply, fill #0

## 2022-07-23 MED ORDER — DILTIAZEM HCL ER 120 MG PO CP12: 120.0000 mg | ORAL_CAPSULE | Freq: Two times a day (BID) | ORAL | 2 refills | Status: DC

## 2022-07-23 MED ORDER — FUROSEMIDE 40 MG PO TABS: 40.0000 mg | ORAL_TABLET | Freq: Every day | ORAL | 1 refills | Status: DC | PRN

## 2022-07-23 NOTE — Progress Notes (Signed)
Follow up visit  Subjective:   Sheena Taylor, female    DOB: 1934/05/10, 88 y.o.   MRN: [id number]     HPI  Chief Complaint  Patient presents with   Chronic heart failure with preserved ejection fraction [facility])   Follow-up    2 weeks    88 y.o. year-old Caucasian female with hypertension, arthritis, paroxysmal atrial fibrillation, cardiac tamponade requiring emergency pericardiocentesis in 05/2022, recurrent bilateral pleural effusions requiring Rt sided thoracentesis in 05/2022.  Patient is here today with her husband.  She is back to her assisted living facility.  She is able to walk around without walker.  She does report shortness of breath over the weekend, but has improved.  She denies any chest pain at this time.  Apparently, she was started on lisinopril and hydrochlorothiazide at the short-term nursing facility.  Blood pressure is 94-56 mmHg today.    Current Outpatient Medications:    acetaminophen (TYLENOL) 500 MG tablet, Take 500 mg by mouth every 8 (eight) hours as needed (pain)., Disp: , Rfl:    amiodarone (PACERONE) 200 MG tablet, Take 1 tablet (200 mg total) by mouth 2 (two) times daily. (Patient taking differently: Take 200 mg by mouth daily.), Disp: 60 tablet, Rfl: 0   diltiazem (CARDIZEM SR) 120 MG 12 hr capsule, Take 1 capsule (120 mg total) by mouth every 12 (twelve) hours. (Patient taking differently: Take 120 mg by mouth 2 (two) times daily.), Disp: 60 capsule, Rfl: 1   ELIQUIS 5 MG TABS tablet, TAKE 1 TABLET BY MOUTH TWICE A DAY (Patient taking differently: Take 5 mg by mouth 2 (two) times daily.), Disp: 180 tablet, Rfl: 2   empagliflozin (JARDIANCE) 10 MG TABS tablet, Take 1 tablet (10 mg total) by mouth daily., Disp: 30 tablet, Rfl: 0   Multiple Vitamins-Minerals (MULTIVITAMIN WITH MINERALS) tablet, Take 1 tablet by mouth every evening., Disp: , Rfl:    Multiple Vitamins-Minerals (PRESERVISION AREDS) CAPS, Take 1 capsule by mouth in the morning and at  bedtime., Disp: , Rfl:    pantoprazole sodium (PROTONIX) 40 mg, Take 40 mg by mouth daily. Pantoprazole 40 mg oral packet., Disp: , Rfl:    vitamin C (ASCORBIC ACID) 500 MG tablet, Take 500 mg by mouth daily., Disp: , Rfl:    Cardiovascular & other pertient studies:  Reviewed external labs and tests, independently interpreted  EKG 07/03/2022: Atrial fibrillation 96 bpm  Nonspecific T-abnormality   Recent labs: 06/30/2022: Glucose 117, BUN/Cr 18/1.14. EGFR 47. Na/K 131/3.7. Albumin 2.7. Rest of the CMP normal BNP 252, trop HS 4 H/H 10.8/33.7. MCV 91. Platelets 475 HbA1C 5.9% TSH 0.38 normal    Review of Systems  Cardiovascular:  Positive for dyspnea on exertion. Negative for chest pain, leg swelling, palpitations and syncope.         Vitals:   07/23/22 1026  BP: (!) 94/56  Pulse: (!) 104  Resp: 16  SpO2: 96%    Body mass index is 26.15 kg/m. Filed Weights   07/23/22 1026  Weight: 147 lb 9.6 oz (67 kg)     Objective:   Physical Exam Vitals and nursing note reviewed.  Constitutional:      General: She is not in acute distress. Neck:     Vascular: No JVD.  Cardiovascular:     Rate and Rhythm: Normal rate. Rhythm irregular.     Heart sounds: Normal heart sounds. No murmur heard. Pulmonary:     Effort: Pulmonary effort is normal.  Breath sounds: No wheezing or rales.     Comments: Reduced lung sounds right lower lung Musculoskeletal:     Right lower leg: Edema (Trace) present.     Left lower leg: Edema (Trace) present.             Visit diagnoses:   ICD-10-CM   1. Dyspnea on exertion  R06.09 DG Chest 2 View       Orders Placed This Encounter  Procedures   DG Chest 2 View   Medications Discontinued During This Encounter  Medication Reason   pantoprazole sodium (PROTONIX) 40 mg    hydrochlorothiazide (HYDRODIURIL) 25 MG tablet Discontinued by provider   lisinopril (ZESTRIL) 20 MG tablet Discontinued by provider   diltiazem (CARDIZEM SR)  120 MG 12 hr capsule Reorder     Assessment & Recommendations:   88 y.o. year-old Caucasian female with hypertension, arthritis, paroxysmal atrial fibrillation, cardiac tamponade requiring emergency pericardiocentesis in 05/2022, recurrent bilateral pleural effusions requiring Rt sided thoracentesis in 05/2022.  Persistent A-fib: Resting heart rate near 100, increases to up to 140 bpm on walking, as checked in the office today. I suspect A-fib is at least contributing to her exertional dyspnea.  I discussed with her regarding cardioversion, possibly with TEE. Patient remains unsure, and we will readdress this at next visit next week. In the meantime, continue diltiazem SR 120 mg twice daily, and amiodarone 200 mg twice daily. I will reduce amiodarone to 200 mg daily at next visit. CHA2DS2VASc score at least 4, annual stroke risk 5%.  Continue Eliquis 5 mg twice daily.   HFpEF: No Rales on exam, trace leg edema. I do suspect she may have right-sided pleural effusion.  Low suspicion for recurrent pericardial effusion. Nonetheless, given her recent history, recommend stat chest x-ray and limited echocardiogram.    Recommend holding Jardiance if SBP <100 mmHg. Do take Lasix for 100 mg daily as needed for leg edema and shortness of breath.    F/u in 1 week     Elder Negus, MD Pager: [phone] Office: [phone]

## 2022-07-26 ENCOUNTER — Ambulatory Visit: Payer: Medicare Other

## 2022-07-26 ENCOUNTER — Other Ambulatory Visit: Payer: Self-pay

## 2022-07-26 DIAGNOSIS — I3139 Other pericardial effusion (noninflammatory): Secondary | ICD-10-CM

## 2022-07-26 NOTE — Telephone Encounter (Signed)
Is it ok to refill Amiodarone?

## 2022-07-27 MED ORDER — AMIODARONE HCL 200 MG PO TABS: 200.0000 mg | ORAL_TABLET | Freq: Every day | ORAL | 1 refills | Status: DC

## 2022-07-27 MED ORDER — DILTIAZEM HCL ER 120 MG PO CP12: 120.0000 mg | ORAL_CAPSULE | Freq: Two times a day (BID) | ORAL | 2 refills | Status: DC

## 2022-07-27 MED ORDER — EMPAGLIFLOZIN 10 MG PO TABS: 10.0000 mg | ORAL_TABLET | Freq: Every day | ORAL | 3 refills | Status: DC

## 2022-07-27 NOTE — H&P (View-Only) (Signed)
 Follow up visit  Subjective:   Sheena Taylor, female    DOB: 04/19/1934, 87 y.o.   MRN: [id number]     HPI  No chief complaint on file.   87 y.o. year-old Caucasian female with hypertension, arthritis, paroxysmal atrial fibrillation, cardiac tamponade requiring emergency pericardiocentesis in 05/2022, recurrent bilateral pleural effusions requiring Rt sided thoracentesis in 05/2022.  Patient is here today with her husband.  She is back to her assisted living facility.  She is able to walk around without walker.  She does report shortness of breath over the weekend, but has improved.  She denies any chest pain at this time.  Apparently, she was started on lisinopril and hydrochlorothiazide at the short-term nursing facility.  Blood pressure is 94-56 mmHg today.    Current Outpatient Medications:    acetaminophen (TYLENOL) 500 MG tablet, Take 500 mg by mouth every 8 (eight) hours as needed (pain)., Disp: , Rfl:    amiodarone (PACERONE) 200 MG tablet, Take 1 tablet (200 mg total) by mouth daily., Disp: 60 tablet, Rfl: 1   diltiazem (CARDIZEM SR) 120 MG 12 hr capsule, Take 1 capsule (120 mg total) by mouth 2 (two) times daily., Disp: 120 capsule, Rfl: 2   ELIQUIS 5 MG TABS tablet, TAKE 1 TABLET BY MOUTH TWICE A DAY (Patient taking differently: Take 5 mg by mouth 2 (two) times daily.), Disp: 180 tablet, Rfl: 2   empagliflozin (JARDIANCE) 10 MG TABS tablet, Take 1 tablet (10 mg total) by mouth daily. Hold if SBP <100 mmHg, Disp: 60 tablet, Rfl: 3   furosemide (LASIX) 40 MG tablet, Take 1 tablet (40 mg total) by mouth daily as needed., Disp: 90 tablet, Rfl: 1   Multiple Vitamins-Minerals (MULTIVITAMIN WITH MINERALS) tablet, Take 1 tablet by mouth every evening., Disp: , Rfl:    Multiple Vitamins-Minerals (PRESERVISION AREDS) CAPS, Take 1 capsule by mouth in the morning and at bedtime., Disp: , Rfl:    pravastatin (PRAVACHOL) 20 MG tablet, Take 20 mg by mouth daily., Disp: , Rfl:     vitamin C (ASCORBIC ACID) 500 MG tablet, Take 500 mg by mouth daily., Disp: , Rfl:    Cardiovascular & other pertient studies:  Reviewed external labs and tests, independently interpreted  EKG 07/03/2022: Atrial fibrillation 96 bpm  Nonspecific T-abnormality  Echocardiogram 07/26/2022:  Low-normal LV systolic function with visual EF 55%. Left ventricle cavity is normal in size. Mild concentric hypertrophy of the left ventricle. Normal global wall motion. Indeterminate diastolic filling pattern, indeterminate LAP. Calculated EF 52%. Left atrial cavity is severely dilated at 52.1 ml/m^2. Right atrial cavity is severely dilated. Structurally normal mitral valve.  Moderate (Grade II) mitral regurgitation. Structurally normal tricuspid valve.  Moderate tricuspid regurgitation likely underestimated. No evidence of pulmonary hypertension. RVSP measures 33 mmHg. Pericardium is normal. Small pericardial effusion. No prior available for comparison.  Recent labs: 06/30/2022: Glucose 117, BUN/Cr 18/1.14. EGFR 47. Na/K 131/3.7. Albumin 2.7. Rest of the CMP normal BNP 252, trop HS 4 H/H 10.8/33.7. MCV 91. Platelets 475 HbA1C 5.9% TSH 0.38 normal    Review of Systems  Cardiovascular:  Positive for dyspnea on exertion. Negative for chest pain, leg swelling, palpitations and syncope.         There were no vitals filed for this visit.   There is no height or weight on file to calculate BMI. There were no vitals filed for this visit.    Objective:   Physical Exam Vitals and nursing note reviewed.  Constitutional:        General: She is not in acute distress. Neck:     Vascular: No JVD.  Cardiovascular:     Rate and Rhythm: Normal rate. Rhythm irregular.     Heart sounds: Normal heart sounds. No murmur heard. Pulmonary:     Effort: Pulmonary effort is normal.     Breath sounds: No wheezing or rales.     Comments: Reduced lung sounds right lower lung Musculoskeletal:     Right lower  leg: Edema (Trace) present.     Left lower leg: Edema (Trace) present.             Visit diagnoses: No diagnosis found.    No orders of the defined types were placed in this encounter.  There are no discontinued medications.    Assessment & Recommendations:   87 y.o. year-old Caucasian female with hypertension, arthritis, paroxysmal atrial fibrillation, cardiac tamponade requiring emergency pericardiocentesis in 05/2022, recurrent bilateral pleural effusions requiring Rt sided thoracentesis in 05/2022.  Persistent A-fib: Resting heart rate near 100, increases to up to 140 bpm on walking, as checked in the office today. I suspect A-fib is at least contributing to her exertional dyspnea.  I discussed with her regarding cardioversion, possibly with TEE. Patient remains unsure, and we will readdress this at next visit next week. In the meantime, continue diltiazem SR 120 mg twice daily, and amiodarone 200 mg twice daily. I will reduce amiodarone to 200 mg daily at next visit. CHA2DS2VASc score at least 4, annual stroke risk 5%.  Continue Eliquis 5 mg twice daily.   HFpEF: No Rales on exam, trace leg edema. I do suspect she may have right-sided pleural effusion.  Low suspicion for recurrent pericardial effusion. Nonetheless, given her recent history, recommend stat chest x-ray and limited echocardiogram.    Recommend holding Jardiance if SBP <100 mmHg. Do take Lasix for 100 mg daily as needed for leg edema and shortness of breath.    F/u in 1 week     Tzion Wedel J Keilani Terrance, MD Pager: [phone] Office: [phone] 

## 2022-07-27 NOTE — Progress Notes (Unsigned)
Follow up visit  Subjective:   Sheena Taylor, female    DOB: [birth year], 88 y.o.   MRN: [id number]     HPI  No chief complaint on file.   88 y.o. year-old Caucasian female with hypertension, arthritis, paroxysmal atrial fibrillation, cardiac tamponade requiring emergency pericardiocentesis in 05/2022, recurrent bilateral pleural effusions requiring Rt sided thoracentesis in 05/2022.  Patient is here today with her husband.  She is back to her assisted living facility.  She is able to walk around without walker.  She does report shortness of breath over the weekend, but has improved.  She denies any chest pain at this time.  Apparently, she was started on lisinopril and hydrochlorothiazide at the short-term nursing facility.  Blood pressure is 94-56 mmHg today.    Current Outpatient Medications:    acetaminophen (TYLENOL) 500 MG tablet, Take 500 mg by mouth every 8 (eight) hours as needed (pain)., Disp: , Rfl:    amiodarone (PACERONE) 200 MG tablet, Take 1 tablet (200 mg total) by mouth daily., Disp: 60 tablet, Rfl: 1   diltiazem (CARDIZEM SR) 120 MG 12 hr capsule, Take 1 capsule (120 mg total) by mouth 2 (two) times daily., Disp: 120 capsule, Rfl: 2   ELIQUIS 5 MG TABS tablet, TAKE 1 TABLET BY MOUTH TWICE A DAY (Patient taking differently: Take 5 mg by mouth 2 (two) times daily.), Disp: 180 tablet, Rfl: 2   empagliflozin (JARDIANCE) 10 MG TABS tablet, Take 1 tablet (10 mg total) by mouth daily. Hold if SBP <100 mmHg, Disp: 60 tablet, Rfl: 3   furosemide (LASIX) 40 MG tablet, Take 1 tablet (40 mg total) by mouth daily as needed., Disp: 90 tablet, Rfl: 1   Multiple Vitamins-Minerals (MULTIVITAMIN WITH MINERALS) tablet, Take 1 tablet by mouth every evening., Disp: , Rfl:    Multiple Vitamins-Minerals (PRESERVISION AREDS) CAPS, Take 1 capsule by mouth in the morning and at bedtime., Disp: , Rfl:    pravastatin (PRAVACHOL) 20 MG tablet, Take 20 mg by mouth daily., Disp: , Rfl:     vitamin C (ASCORBIC ACID) 500 MG tablet, Take 500 mg by mouth daily., Disp: , Rfl:    Cardiovascular & other pertient studies:  Reviewed external labs and tests, independently interpreted  EKG 07/03/2022: Atrial fibrillation 96 bpm  Nonspecific T-abnormality  Echocardiogram 07/26/2022:  Low-normal LV systolic function with visual EF 55%. Left ventricle cavity is normal in size. Mild concentric hypertrophy of the left ventricle. Normal global wall motion. Indeterminate diastolic filling pattern, indeterminate LAP. Calculated EF 52%. Left atrial cavity is severely dilated at 52.1 ml/m^2. Right atrial cavity is severely dilated. Structurally normal mitral valve.  Moderate (Grade II) mitral regurgitation. Structurally normal tricuspid valve.  Moderate tricuspid regurgitation likely underestimated. No evidence of pulmonary hypertension. RVSP measures 33 mmHg. Pericardium is normal. Small pericardial effusion. No prior available for comparison.  Recent labs: 06/30/2022: Glucose 117, BUN/Cr 18/1.14. EGFR 47. Na/K 131/3.7. Albumin 2.7. Rest of the CMP normal BNP 252, trop HS 4 H/H 10.8/33.7. MCV 91. Platelets 475 HbA1C 5.9% TSH 0.38 normal    Review of Systems  Cardiovascular:  Positive for dyspnea on exertion. Negative for chest pain, leg swelling, palpitations and syncope.         There were no vitals filed for this visit.   There is no height or weight on file to calculate BMI. There were no vitals filed for this visit.    Objective:   Physical Exam Vitals and nursing note reviewed.  Constitutional:  General: She is not in acute distress. Neck:     Vascular: No JVD.  Cardiovascular:     Rate and Rhythm: Normal rate. Rhythm irregular.     Heart sounds: Normal heart sounds. No murmur heard. Pulmonary:     Effort: Pulmonary effort is normal.     Breath sounds: No wheezing or rales.     Comments: Reduced lung sounds right lower lung Musculoskeletal:     Right lower  leg: Edema (Trace) present.     Left lower leg: Edema (Trace) present.             Visit diagnoses: No diagnosis found.    No orders of the defined types were placed in this encounter.  There are no discontinued medications.    Assessment & Recommendations:   88 y.o. year-old Caucasian female with hypertension, arthritis, paroxysmal atrial fibrillation, cardiac tamponade requiring emergency pericardiocentesis in 05/2022, recurrent bilateral pleural effusions requiring Rt sided thoracentesis in 05/2022.  Persistent A-fib: Resting heart rate near 100, increases to up to 140 bpm on walking, as checked in the office today. I suspect A-fib is at least contributing to her exertional dyspnea.  I discussed with her regarding cardioversion, possibly with TEE. Patient remains unsure, and we will readdress this at next visit next week. In the meantime, continue diltiazem SR 120 mg twice daily, and amiodarone 200 mg twice daily. I will reduce amiodarone to 200 mg daily at next visit. CHA2DS2VASc score at least 4, annual stroke risk 5%.  Continue Eliquis 5 mg twice daily.   HFpEF: No Rales on exam, trace leg edema. I do suspect she may have right-sided pleural effusion.  Low suspicion for recurrent pericardial effusion. Nonetheless, given her recent history, recommend stat chest x-ray and limited echocardiogram.    Recommend holding Jardiance if SBP <100 mmHg. Do take Lasix for 100 mg daily as needed for leg edema and shortness of breath.    F/u in 1 week     Elder Negus, MD Pager: [phone] Office: [phone]

## 2022-08-01 ENCOUNTER — Encounter: Payer: Self-pay | Admitting: Cardiology

## 2022-08-01 ENCOUNTER — Ambulatory Visit: Payer: Medicare Other | Admitting: Cardiology

## 2022-08-01 VITALS — BP 129/87 | HR 120 | Ht 63.0 in | Wt 147.0 lb

## 2022-08-01 DIAGNOSIS — I3139 Other pericardial effusion (noninflammatory): Secondary | ICD-10-CM

## 2022-08-01 DIAGNOSIS — I5032 Chronic diastolic (congestive) heart failure: Secondary | ICD-10-CM

## 2022-08-01 DIAGNOSIS — I4819 Other persistent atrial fibrillation: Secondary | ICD-10-CM

## 2022-08-01 DIAGNOSIS — R0609 Other forms of dyspnea: Secondary | ICD-10-CM

## 2022-08-01 MED ORDER — DILTIAZEM HCL ER 120 MG PO CP12: 120.0000 mg | ORAL_CAPSULE | Freq: Two times a day (BID) | ORAL | 2 refills | Status: DC

## 2022-08-07 LAB — BASIC METABOLIC PANEL
BUN/Creatinine Ratio: 19 (ref 12–28)
BUN: 14 mg/dL (ref 8–27)
CO2: 19 mmol/L — ABNORMAL LOW (ref 20–29)
Calcium: 9.1 mg/dL (ref 8.7–10.3)
Chloride: 102 mmol/L (ref 96–106)
Creatinine, Ser: 0.74 mg/dL (ref 0.57–1.00)
Glucose: 90 mg/dL (ref 70–99)
Potassium: 4.2 mmol/L (ref 3.5–5.2)
Sodium: 139 mmol/L (ref 134–144)
eGFR: 79 mL/min/{1.73_m2} (ref >59)

## 2022-08-07 LAB — BRAIN NATRIURETIC PEPTIDE: BNP: 233.1 pg/mL — ABNORMAL HIGH (ref 0.0–100.0)

## 2022-08-16 NOTE — Pre-Procedure Instructions (Signed)
Spoke with patient on the phone regarding instructions for procedure tomorrow - arrive at 1030, NPO after 0000, confirmed patient will have ride home and responsible person to stay with them for 24 hours after the procedure, confirmed no misses doses of eliquis

## 2022-08-17 ENCOUNTER — Ambulatory Visit (HOSPITAL_COMMUNITY): Payer: Medicare Other | Admitting: Anesthesiology

## 2022-08-17 ENCOUNTER — Ambulatory Visit (HOSPITAL_COMMUNITY)
Admission: RE | Admit: 2022-08-17 | Discharge: 2022-08-17 | Disposition: A | Discharge status: Home / Self Care | Payer: Medicare Other | Source: Ambulatory Visit | Attending: Cardiology | Admitting: Cardiology

## 2022-08-17 ENCOUNTER — Encounter (HOSPITAL_COMMUNITY)
Admission: RE | Disposition: A | Discharge status: Home / Self Care | Payer: Self-pay | Source: Home / Self Care | Attending: Cardiology

## 2022-08-17 ENCOUNTER — Other Ambulatory Visit: Payer: Self-pay

## 2022-08-17 ENCOUNTER — Ambulatory Visit (HOSPITAL_COMMUNITY)
Admission: RE | Admit: 2022-08-17 | Discharge: 2022-08-17 | Disposition: A | Discharge status: Home / Self Care | Payer: Medicare Other | Attending: Cardiology | Admitting: Cardiology

## 2022-08-17 ENCOUNTER — Ambulatory Visit (HOSPITAL_BASED_OUTPATIENT_CLINIC_OR_DEPARTMENT_OTHER): Payer: Medicare Other | Admitting: Anesthesiology

## 2022-08-17 DIAGNOSIS — I10 Essential (primary) hypertension: Secondary | ICD-10-CM | POA: Diagnosis not present

## 2022-08-17 DIAGNOSIS — I4891 Unspecified atrial fibrillation: Secondary | ICD-10-CM

## 2022-08-17 DIAGNOSIS — I081 Rheumatic disorders of both mitral and tricuspid valves: Secondary | ICD-10-CM

## 2022-08-17 DIAGNOSIS — I3139 Other pericardial effusion (noninflammatory): Secondary | ICD-10-CM | POA: Diagnosis not present

## 2022-08-17 DIAGNOSIS — I110 Hypertensive heart disease with heart failure: Secondary | ICD-10-CM | POA: Insufficient documentation

## 2022-08-17 DIAGNOSIS — F17210 Nicotine dependence, cigarettes, uncomplicated: Secondary | ICD-10-CM | POA: Diagnosis not present

## 2022-08-17 DIAGNOSIS — I4819 Other persistent atrial fibrillation: Secondary | ICD-10-CM | POA: Insufficient documentation

## 2022-08-17 DIAGNOSIS — Z7901 Long term (current) use of anticoagulants: Secondary | ICD-10-CM | POA: Insufficient documentation

## 2022-08-17 DIAGNOSIS — R0609 Other forms of dyspnea: Secondary | ICD-10-CM | POA: Insufficient documentation

## 2022-08-17 DIAGNOSIS — I340 Nonrheumatic mitral (valve) insufficiency: Secondary | ICD-10-CM | POA: Diagnosis not present

## 2022-08-17 DIAGNOSIS — I5032 Chronic diastolic (congestive) heart failure: Secondary | ICD-10-CM | POA: Insufficient documentation

## 2022-08-17 HISTORY — PX: TEE WITHOUT CARDIOVERSION: SHX5443

## 2022-08-17 HISTORY — PX: CARDIOVERSION: SHX1299

## 2022-08-17 LAB — POCT I-STAT, CHEM 8
BUN: 15 mg/dL (ref 8–23)
Calcium, Ion: 1.21 mmol/L (ref 1.15–1.40)
Chloride: 106 mmol/L (ref 98–111)
Creatinine, Ser: 0.8 mg/dL (ref 0.44–1.00)
Glucose, Bld: 98 mg/dL (ref 70–99)
HCT: 40 % (ref 36.0–46.0)
Hemoglobin: 13.6 g/dL (ref 12.0–15.0)
Potassium: 3.7 mmol/L (ref 3.5–5.1)
Sodium: 142 mmol/L (ref 135–145)
TCO2: 26 mmol/L (ref 22–32)

## 2022-08-17 LAB — ECHO TEE

## 2022-08-17 SURGERY — ECHOCARDIOGRAM, TRANSESOPHAGEAL
Anesthesia: General

## 2022-08-17 MED ORDER — PROPOFOL 10 MG/ML IV BOLUS
INTRAVENOUS | Status: DC | PRN
  Administered 2022-08-17: 50 mg via INTRAVENOUS
  Administered 2022-08-17: 10 mg via INTRAVENOUS
  Administered 2022-08-17: 30 mg via INTRAVENOUS
  Administered 2022-08-17: 20 mg via INTRAVENOUS
  Administered 2022-08-17: 10 mg via INTRAVENOUS

## 2022-08-17 MED ORDER — PROPOFOL 500 MG/50ML IV EMUL
INTRAVENOUS | Status: DC | PRN
  Administered 2022-08-17: 100 ug/kg/min via INTRAVENOUS

## 2022-08-17 MED ORDER — METOPROLOL TARTRATE 5 MG/5ML IV SOLN
INTRAVENOUS | Status: AC
  Filled 2022-08-17: qty 5

## 2022-08-17 MED ORDER — METOPROLOL TARTRATE 25 MG PO TABS: 25.0000 mg | ORAL_TABLET | Freq: Two times a day (BID) | ORAL | 2 refills | Status: DC

## 2022-08-17 MED ORDER — METOPROLOL TARTRATE 5 MG/5ML IV SOLN
INTRAVENOUS | Status: DC | PRN
  Administered 2022-08-17: 5 mg via INTRAVENOUS

## 2022-08-17 MED ORDER — SODIUM CHLORIDE 0.9 % IV SOLN: INTRAVENOUS | Status: DC

## 2022-08-17 SURGICAL SUPPLY — 1 items: ELECT DEFIB PAD ADLT CADENCE (PAD) ×1 IMPLANT

## 2022-08-17 NOTE — Anesthesia Preprocedure Evaluation (Signed)
Anesthesia Evaluation  Patient identified by MRN, date of birth, ID band Patient awake    Reviewed: Allergy & Precautions, H&P , NPO status , Patient's Chart, lab work & pertinent test results  Airway Mallampati: II  TM Distance: >3 FB Neck ROM: Full    Dental no notable dental hx.    Pulmonary Current Smoker   Pulmonary exam normal breath sounds clear to auscultation       Cardiovascular hypertension, + dysrhythmias Atrial Fibrillation + Valvular Problems/Murmurs MR  Rhythm:Irregular Rate:Normal + Systolic murmurs 1. Left ventricular ejection fraction, by estimation, is 65 to 70%. The  left ventricle has normal function. The left ventricle has no regional  wall motion abnormalities. Left ventricular diastolic parameters are  indeterminate. There is the  interventricular septum is flattened in diastole ('D' shaped left  ventricle), consistent with right ventricular volume overload.   2. Right ventricular systolic function is normal. The right ventricular  size is is dilated. There is mildly elevated pulmonary artery systolic  pressure. The estimated right ventricular systolic pressure is 43.3 mmHg.   3. Left atrial size was severely dilated.   4. Right atrial size was mildly dilated.   5. A small pericardial effusion is present. The pericardial effusion is  circumferential and posterior to the left ventricle.   6. The mitral valve is normal in structure. Mild mitral valve  regurgitation. No evidence of mitral stenosis.   7. Eccentric tricuspid regurgitation. Tricuspid valve regurgitation is  mild to moderate.   8. The aortic valve is tricuspid. There is mild calcification of the  aortic valve. There is mild thickening of the aortic valve. Aortic valve  regurgitation is not visualized. No aortic stenosis is present.   9. The inferior vena cava is dilated in size with <50% respiratory  variability, suggesting right atrial pressure  of 15 mmHg.   Comparison(s): Prior images reviewed side by side. Mitral and tricuspid  regurgitation have improved; RVSP and pericardial effusion are similar.      Neuro/Psych negative neurological ROS  negative psych ROS   GI/Hepatic negative GI ROS, Neg liver ROS,,,  Endo/Other  negative endocrine ROS    Renal/GU negative Renal ROS  negative genitourinary   Musculoskeletal negative musculoskeletal ROS (+)    Abdominal   Peds negative pediatric ROS (+)  Hematology negative hematology ROS (+)   Anesthesia Other Findings   Reproductive/Obstetrics negative OB ROS                             Anesthesia Physical Anesthesia Plan  ASA: 3  Anesthesia Plan: General   Post-op Pain Management:    Induction: Intravenous  PONV Risk Score and Plan: Propofol infusion and Treatment may vary due to age or medical condition  Airway Management Planned: Mask  Additional Equipment:   Intra-op Plan:   Post-operative Plan:   Informed Consent: I have reviewed the patients History and Physical, chart, labs and discussed the procedure including the risks, benefits and alternatives for the proposed anesthesia with the patient or authorized representative who has indicated his/her understanding and acceptance.     Dental advisory given  Plan Discussed with: CRNA and Surgeon  Anesthesia Plan Comments: (Mac for TEE GA for cardioversion)       Anesthesia Quick Evaluation

## 2022-08-17 NOTE — Anesthesia Postprocedure Evaluation (Signed)
Anesthesia Post Note  Patient: Sheena Taylor  Procedure(s) Performed: TRANSESOPHAGEAL ECHOCARDIOGRAM CARDIOVERSION     Patient location during evaluation: PACU Anesthesia Type: General Level of consciousness: awake and alert Pain management: pain level controlled Vital Signs Assessment: post-procedure vital signs reviewed and stable Respiratory status: spontaneous breathing, nonlabored ventilation, respiratory function stable and patient connected to nasal cannula oxygen Cardiovascular status: blood pressure returned to baseline and stable Postop Assessment: no apparent nausea or vomiting Anesthetic complications: no  No notable events documented.  Last Vitals:  Vitals:   08/17/22 1037 08/17/22 1131  BP:  100/61  Pulse:  (!) 55  Resp:  16  Temp: 37 C 36.8 C  SpO2:  97%    Last Pain:  Vitals:   08/17/22 1131  TempSrc: Temporal  PainSc: 0-No pain                 Daiwik Buffalo S

## 2022-08-17 NOTE — Interval H&P Note (Signed)
History and Physical Interval Note:  08/17/2022 10:55 AM  Sheena Taylor  has presented today for surgery, with the diagnosis of AFIB.  The various methods of treatment have been discussed with the patient and family. After consideration of risks, benefits and other options for treatment, the patient has consented to  Procedure(s): TRANSESOPHAGEAL ECHOCARDIOGRAM (N/A) CARDIOVERSION (N/A) as a surgical intervention.  The patient's history has been reviewed, patient examined, no change in status, stable for surgery.  I have reviewed the patient's chart and labs.  Questions were answered to the patient's satisfaction.     Yates Decamp

## 2022-08-17 NOTE — CV Procedure (Signed)
TEE: Under moderate sedation, TEE was performed without complications: LV: Normal. Normal EF. RV: Normal LA: Normal. Left atrial appendage: Normal without thrombus. Normal function. Inter atrial septum is intact without defect. RA: Normal  MV: Mild to at most moderate MR. Two central jets noted.  TV: Normal Trace TR AV: Normal. No AI or AS. PV: Normal. Trace PI.  Thoracic and ascending aorta: Mild  atheromatous changes.  Direct current cardioversion 08/17/2022 11:22 AM  Indication symptomatic A. Fibrillation.  Procedure: Using continous  IV Propofol to achieve deep sedation with help of anesthetist,  synchronized direct current cardioversion performed. Patient was delivered with 150 x1, then 200  Joules of electricity X 2 with success to NSR. Patient tolerated the procedure well. No immediate complication noted. 5 mg IV Metoprolol administered before the last cardioversion shock.  Allergies as of 08/17/2022       Reactions   Codeine Nausea And Vomiting        Medication List     TAKE these medications    acetaminophen 500 MG tablet Commonly known as: TYLENOL Take 500-1,000 mg by mouth every 8 (eight) hours as needed (pain).   CALCIUM + D PO Take 1 tablet by mouth daily.   CALCIUM 500 PO Take 500 mg by mouth daily.   diltiazem 120 MG 12 hr capsule Commonly known as: CARDIZEM SR Take 1 capsule (120 mg total) by mouth 2 (two) times daily.   Eliquis 5 MG Tabs tablet Generic drug: apixaban TAKE 1 TABLET BY MOUTH TWICE A DAY   furosemide 40 MG tablet Commonly known as: LASIX Take 1 tablet (40 mg total) by mouth daily as needed.   Jardiance 10 MG Tabs tablet Generic drug: empagliflozin Take 10 mg by mouth daily. If sbp over 100 skip dose   metoprolol tartrate 25 MG tablet Commonly known as: LOPRESSOR Take 1 tablet (25 mg total) by mouth 2 (two) times daily.   mirtazapine 15 MG tablet Commonly known as: REMERON Take 15 mg by mouth at bedtime as needed (sleep).    PreserVision AREDS Caps Take 1 capsule by mouth in the morning and at bedtime.          Yates Decamp, MD, [facility] 08/17/2022, 11:22 AM Office: [phone] Fax: [phone] Pager: [phone]

## 2022-08-17 NOTE — Transfer of Care (Signed)
Immediate Anesthesia Transfer of Care Note  Patient: Sheena Taylor  Procedure(s) Performed: TRANSESOPHAGEAL ECHOCARDIOGRAM CARDIOVERSION  Patient Location: Cath Lab  Anesthesia Type:General  Level of Consciousness: drowsy and patient cooperative  Airway & Oxygen Therapy: Patient Spontanous Breathing and Patient connected to nasal cannula oxygen  Post-op Assessment: Report given to RN, Post -op Vital signs reviewed and stable, and Patient moving all extremities X 4  Post vital signs: Reviewed and stable  Last Vitals:  Vitals Value Taken Time  BP    Temp    Pulse    Resp    SpO2      Last Pain:  Vitals:   08/17/22 1037  TempSrc: Temporal         Complications: No notable events documented.

## 2022-08-20 ENCOUNTER — Encounter (HOSPITAL_COMMUNITY): Payer: Self-pay | Admitting: Cardiology

## 2022-09-06 ENCOUNTER — Encounter: Payer: Self-pay | Admitting: Cardiology

## 2022-09-06 ENCOUNTER — Ambulatory Visit: Payer: Medicare Other | Admitting: Cardiology

## 2022-09-06 VITALS — BP 132/71 | HR 87 | Resp 16 | Ht 63.0 in | Wt 156.4 lb

## 2022-09-06 DIAGNOSIS — I5032 Chronic diastolic (congestive) heart failure: Secondary | ICD-10-CM

## 2022-09-06 DIAGNOSIS — R0609 Other forms of dyspnea: Secondary | ICD-10-CM

## 2022-09-06 DIAGNOSIS — I4819 Other persistent atrial fibrillation: Secondary | ICD-10-CM

## 2022-09-06 MED ORDER — METOPROLOL TARTRATE 25 MG PO TABS: 37.5000 mg | ORAL_TABLET | Freq: Two times a day (BID) | ORAL | 0 refills | Status: DC

## 2022-09-06 MED ORDER — FUROSEMIDE 40 MG PO TABS: 40.0000 mg | ORAL_TABLET | Freq: Every day | ORAL | 0 refills | Status: DC | PRN

## 2022-09-06 NOTE — Progress Notes (Signed)
Follow up visit  Subjective:   Sheena Taylor, female    DOB: 18-Jun-1934, 88 y.o.   MRN: [id number]     HPI  Chief Complaint  Patient presents with   Post TEE        Cardioversion   Follow-up    88 y.o. year-old Caucasian female with hypertension, arthritis, paroxysmal atrial fibrillation, cardiac tamponade requiring emergency pericardiocentesis in 05/2022, recurrent bilateral pleural effusions requiring Rt sided thoracentesis in 05/2022.  Patient underwent successful cardioversion on 08/17/2022. She did not notice any significant improvement in his dyspnea symptoms. She still has leg edema. She has not been taking lasix daily. However, she hopes to come off some of his medications.     Current Outpatient Medications:    acetaminophen (TYLENOL) 500 MG tablet, Take 500-1,000 mg by mouth every 8 (eight) hours as needed (pain)., Disp: , Rfl:    Calcium Carbonate (CALCIUM 500 PO), Take 500 mg by mouth daily., Disp: , Rfl:    Calcium Citrate-Vitamin D (CALCIUM + D PO), Take 1 tablet by mouth daily., Disp: , Rfl:    diltiazem (CARDIZEM SR) 120 MG 12 hr capsule, Take 1 capsule (120 mg total) by mouth 2 (two) times daily., Disp: 120 capsule, Rfl: 2   ELIQUIS 5 MG TABS tablet, TAKE 1 TABLET BY MOUTH TWICE A DAY, Disp: 180 tablet, Rfl: 2   empagliflozin (JARDIANCE) 10 MG TABS tablet, Take 10 mg by mouth daily. If sbp over 100 skip dose, Disp: , Rfl:    furosemide (LASIX) 40 MG tablet, Take 1 tablet (40 mg total) by mouth daily as needed., Disp: 90 tablet, Rfl: 1   mirtazapine (REMERON) 15 MG tablet, Take 15 mg by mouth at bedtime as needed (sleep)., Disp: , Rfl:    Multiple Vitamins-Minerals (PRESERVISION AREDS) CAPS, Take 1 capsule by mouth in the morning and at bedtime., Disp: , Rfl:    metoprolol tartrate (LOPRESSOR) 25 MG tablet, Take 1 tablet (25 mg total) by mouth 2 (two) times daily., Disp: 60 tablet, Rfl: 2   Cardiovascular & other pertient studies:  Reviewed external labs  and tests, independently interpreted  EKG 09/06/2022: Atrial fibrillation 85 bpm Nonspecific T-abnormality  Cardioversion 08/17/2022: Successful  EKG 08/01/2022: Atrial fibrillation 125 bpm LAFB Old anteroseptal infarct  EKG 07/03/2022: Atrial fibrillation 96 bpm  Nonspecific T-abnormality  Echocardiogram 07/26/2022:  Low-normal LV systolic function with visual EF 55%. Left ventricle cavity is normal in size. Mild concentric hypertrophy of the left ventricle. Normal global wall motion. Indeterminate diastolic filling pattern, indeterminate LAP. Calculated EF 52%. Left atrial cavity is severely dilated at 52.1 ml/m^2. Right atrial cavity is severely dilated. Structurally normal mitral valve.  Moderate (Grade II) mitral regurgitation. Structurally normal tricuspid valve.  Moderate tricuspid regurgitation likely underestimated. No evidence of pulmonary hypertension. RVSP measures 33 mmHg. Pericardium is normal. Small pericardial effusion. No prior available for comparison.  CXR 07/23/2022: 1. Mild cardiomegaly. 2. Chronic emphysematous changes within the upper lungs. 3. Small bilateral pleural effusions, decreased from 06/27/2022.    Recent labs: 08/17/2022: Glucose 98, BUN/Cr 15/0.8. EGFR NA. Na/K 142/3.7. H/H 13/40.  08/06/2022: BNP 233  06/30/2022: Glucose 117, BUN/Cr 18/1.14. EGFR 47. Na/K 131/3.7. Albumin 2.7. Rest of the CMP normal BNP 252, trop HS 4 H/H 10.8/33.7. MCV 91. Platelets 475 HbA1C 5.9% TSH 0.38 normal    Review of Systems  Cardiovascular:  Positive for dyspnea on exertion and leg swelling. Negative for chest pain, palpitations and syncope.  Skin:  Positive for rash.  Vitals:   09/06/22 1020  BP: 132/71  Pulse: 87  Resp: 16  SpO2: 96%    Body mass index is 27.71 kg/m. Filed Weights   09/06/22 1020  Weight: 156 lb 6.4 oz (70.9 kg)     Objective:   Physical Exam Vitals and nursing note reviewed.  Constitutional:      General: She is  not in acute distress. Neck:     Vascular: No JVD.  Cardiovascular:     Rate and Rhythm: Normal rate. Rhythm irregular.     Heart sounds: Normal heart sounds. No murmur heard. Pulmonary:     Effort: Pulmonary effort is normal.     Breath sounds: No wheezing or rales.     Comments: Reduced lung sounds right lower lung Musculoskeletal:     Right lower leg: Edema (1+) present.     Left lower leg: Edema (1+) present.             Visit diagnoses:   ICD-10-CM   1. Persistent atrial fibrillation (HCC)  I48.19 EKG 12-Lead    2. Dyspnea on exertion  R06.09     3. Chronic heart failure with preserved ejection fraction (HCC)  I50.32        Orders Placed This Encounter  Procedures   EKG 12-Lead   Medications Discontinued During This Encounter  Medication Reason   diltiazem (CARDIZEM SR) 120 MG 12 hr capsule Discontinued by provider   furosemide (LASIX) 40 MG tablet Reorder   metoprolol tartrate (LOPRESSOR) 25 MG tablet Reorder     Assessment & Recommendations:   88 y.o. year-old Caucasian female with hypertension, arthritis, paroxysmal atrial fibrillation, cardiac tamponade requiring emergency pericardiocentesis in 05/2022, recurrent bilateral pleural effusions requiring Rt sided thoracentesis in 05/2022.  Persistent A-fib: Back in Afib after successful cardioversion on 08/17/2022, albeit with controlled rate.  Given her controlled rate and advanced 88 I would recommend rate control at this time. She would like to reduce her medications. Therefore, I stopped diltiazem and increased metoprolol tartrate 37.5 mg bid.  CHA2DS2VASc score at least 4, annual stroke risk 5%.  Continue Eliquis 5 mg twice daily.   HFpEF: Mild volume overload. She is not taking lasix daily. Recommend taking lasix 40 mg daily, continue Jardiance.  F/u in 4 weeks     Elder Negus, MD Pager: [phone] Office: [phone]

## 2022-10-03 ENCOUNTER — Encounter: Payer: Self-pay | Admitting: Cardiology

## 2022-10-03 ENCOUNTER — Ambulatory Visit: Payer: Medicare Other | Admitting: Cardiology

## 2022-10-03 VITALS — BP 133/86 | HR 62 | Ht 63.0 in | Wt 154.0 lb

## 2022-10-03 DIAGNOSIS — I5032 Chronic diastolic (congestive) heart failure: Secondary | ICD-10-CM

## 2022-10-03 DIAGNOSIS — I4819 Other persistent atrial fibrillation: Secondary | ICD-10-CM

## 2022-10-03 MED ORDER — EMPAGLIFLOZIN 10 MG PO TABS: 10.0000 mg | ORAL_TABLET | Freq: Every day | ORAL | 3 refills | Status: DC

## 2022-10-03 MED ORDER — FUROSEMIDE 40 MG PO TABS: 40.0000 mg | ORAL_TABLET | Freq: Every day | ORAL | 3 refills | Status: DC

## 2022-10-03 MED ORDER — METOPROLOL TARTRATE 25 MG PO TABS: 25.0000 mg | ORAL_TABLET | Freq: Two times a day (BID) | ORAL | 3 refills | Status: DC

## 2022-10-03 MED ORDER — APIXABAN 5 MG PO TABS: 5.0000 mg | ORAL_TABLET | Freq: Two times a day (BID) | ORAL | 3 refills | Status: DC

## 2022-10-03 NOTE — Progress Notes (Signed)
Follow up visit  Subjective:   Sheena Taylor, female    DOB: January 05, 1935, 88 y.o.   MRN: [id number]     HPI  Chief Complaint  Patient presents with   Atrial Fibrillation   Follow-up    88 y.o. year-old Caucasian female with hypertension, arthritis, paroxysmal atrial fibrillation, cardiac tamponade requiring emergency pericardiocentesis in 05/2022, recurrent bilateral pleural effusions requiring Rt sided thoracentesis in 05/2022.  Patient is here with her husband.  Shortness of breath remains, but significantly improved over time.  It appears that she was taking metoprolol tartrate 25 mg once daily.  She is taking Lasix 40 mg daily, and Jardiance 10 mg daily.    Current Outpatient Medications:    acetaminophen (TYLENOL) 500 MG tablet, Take 500-1,000 mg by mouth every 8 (eight) hours as needed (pain)., Disp: , Rfl:    Calcium Carbonate (CALCIUM 500 PO), Take 500 mg by mouth daily., Disp: , Rfl:    Calcium Citrate-Vitamin D (CALCIUM + D PO), Take 1 tablet by mouth daily., Disp: , Rfl:    ELIQUIS 5 MG TABS tablet, TAKE 1 TABLET BY MOUTH TWICE A DAY, Disp: 180 tablet, Rfl: 2   empagliflozin (JARDIANCE) 10 MG TABS tablet, Take 10 mg by mouth daily. If sbp over 100 skip dose, Disp: , Rfl:    furosemide (LASIX) 40 MG tablet, Take 1 tablet (40 mg total) by mouth daily as needed., Disp: 1 tablet, Rfl: 0   metoprolol tartrate (LOPRESSOR) 25 MG tablet, Take 1.5 tablets (37.5 mg total) by mouth 2 (two) times daily., Disp: 1 tablet, Rfl: 0   mirtazapine (REMERON) 15 MG tablet, Take 15 mg by mouth at bedtime as needed (sleep)., Disp: , Rfl:    Multiple Vitamins-Minerals (PRESERVISION AREDS) CAPS, Take 1 capsule by mouth in the morning and at bedtime., Disp: , Rfl:    Cardiovascular & other pertient studies:  Reviewed external labs and tests, independently interpreted  EKG 10/03/2022: Atrial fibrillation 98 bpm Occasional ectopic ventricular beat   Nonspecific  T-abnormality  Cardioversion 08/17/2022: Successful  EKG 08/01/2022: Atrial fibrillation 125 bpm LAFB Old anteroseptal infarct  EKG 07/03/2022: Atrial fibrillation 96 bpm  Nonspecific T-abnormality  Echocardiogram 07/26/2022:  Low-normal LV systolic function with visual EF 55%. Left ventricle cavity is normal in size. Mild concentric hypertrophy of the left ventricle. Normal global wall motion. Indeterminate diastolic filling pattern, indeterminate LAP. Calculated EF 52%. Left atrial cavity is severely dilated at 52.1 ml/m^2. Right atrial cavity is severely dilated. Structurally normal mitral valve.  Moderate (Grade II) mitral regurgitation. Structurally normal tricuspid valve.  Moderate tricuspid regurgitation likely underestimated. No evidence of pulmonary hypertension. RVSP measures 33 mmHg. Pericardium is normal. Small pericardial effusion. No prior available for comparison.  CXR 07/23/2022: 1. Mild cardiomegaly. 2. Chronic emphysematous changes within the upper lungs. 3. Small bilateral pleural effusions, decreased from 06/27/2022.    Recent labs: 08/17/2022: Glucose 98, BUN/Cr 15/0.8. EGFR NA. Na/K 142/3.7. H/H 13/40.  08/06/2022: BNP 233  06/30/2022: Glucose 117, BUN/Cr 18/1.14. EGFR 47. Na/K 131/3.7. Albumin 2.7. Rest of the CMP normal BNP 252, trop HS 4 H/H 10.8/33.7. MCV 91. Platelets 475 HbA1C 5.9% TSH 0.38 normal    Review of Systems  Cardiovascular:  Positive for dyspnea on exertion (Improving). Negative for chest pain, leg swelling, palpitations and syncope.  Skin:  Negative for rash.         Vitals:   10/03/22 1034  BP: 133/86  Pulse: 62  SpO2: 96%    Body mass index  is 27.28 kg/m. Filed Weights   10/03/22 1034  Weight: 154 lb (69.9 kg)     Objective:   Physical Exam Vitals and nursing note reviewed.  Constitutional:      General: She is not in acute distress. Neck:     Vascular: No JVD.  Cardiovascular:     Rate and Rhythm: Normal  rate. Rhythm irregular.     Heart sounds: Normal heart sounds. No murmur heard. Pulmonary:     Effort: Pulmonary effort is normal.     Breath sounds: No wheezing or rales.     Comments: Reduced lung sounds right lower lung Musculoskeletal:     Right lower leg: No edema.     Left lower leg: No edema.             Visit diagnoses:   ICD-10-CM   1. Persistent atrial fibrillation (HCC)  I48.19 EKG 12-Lead    2. Paroxysmal atrial fibrillation (HCC)  I48.0 apixaban (ELIQUIS) 5 MG TABS tablet       Orders Placed This Encounter  Procedures   EKG 12-Lead   Medications Discontinued During This Encounter  Medication Reason   ELIQUIS 5 MG TABS tablet Reorder   empagliflozin (JARDIANCE) 10 MG TABS tablet Reorder   metoprolol tartrate (LOPRESSOR) 25 MG tablet Reorder   furosemide (LASIX) 40 MG tablet Reorder     Assessment & Recommendations:   88 y.o. year-old Caucasian female with hypertension, arthritis, paroxysmal atrial fibrillation, cardiac tamponade requiring emergency pericardiocentesis in 05/2022, recurrent bilateral pleural effusions requiring Rt sided thoracentesis in 05/2022.  Persistent A-fib: Back in Afib after successful cardioversion on 08/17/2022, albeit with controlled rate.  Given her controlled rate and advanced age, I would recommend rate control at this time. She was taking metoprolol tartrate 25 mg once daily, recommend taking it twice daily. CHA2DS2VASc score at least 4, annual stroke risk 5%.  Continue Eliquis 5 mg twice daily.   HFpEF: Appears fairly euvolemic.   Continue lasix 40 mg daily, continue Jardiance 10 mg daily.  F/u in 4 weeks     Elder Negus, MD Pager: [phone] Office: [phone]

## 2022-10-17 ENCOUNTER — Ambulatory Visit
Admission: RE | Admit: 2022-10-17 | Discharge: 2022-10-17 | Disposition: A | Discharge status: Home / Self Care | Payer: Medicare Other | Source: Ambulatory Visit | Attending: Internal Medicine | Admitting: Internal Medicine

## 2022-10-17 DIAGNOSIS — R928 Other abnormal and inconclusive findings on diagnostic imaging of breast: Secondary | ICD-10-CM

## 2022-11-01 ENCOUNTER — Telehealth: Payer: Self-pay

## 2022-11-01 ENCOUNTER — Other Ambulatory Visit: Payer: Self-pay

## 2022-11-01 NOTE — Telephone Encounter (Signed)
Patient cannot afford jardiance she also doesn't want to take anymore pills

## 2022-11-01 NOTE — Telephone Encounter (Signed)
That's okay. Can skip for now. Keep [place] posted if symptoms of leg edema and/or shortness of breath were to get worse.  Thanks MJP

## 2022-11-01 NOTE — Telephone Encounter (Signed)
Pt aware.

## 2022-12-21 ENCOUNTER — Ambulatory Visit: Payer: Medicare Other | Admitting: Internal Medicine

## 2022-12-24 ENCOUNTER — Ambulatory Visit: Payer: Medicare Other | Admitting: Cardiology

## 2022-12-25 ENCOUNTER — Ambulatory Visit: Payer: Medicare Other | Admitting: Cardiology

## 2022-12-25 ENCOUNTER — Encounter: Payer: Self-pay | Admitting: Cardiology

## 2022-12-25 VITALS — BP 117/83 | HR 100 | Resp 16 | Ht 63.0 in | Wt 167.0 lb

## 2022-12-25 DIAGNOSIS — I5032 Chronic diastolic (congestive) heart failure: Secondary | ICD-10-CM

## 2022-12-25 DIAGNOSIS — I4819 Other persistent atrial fibrillation: Secondary | ICD-10-CM

## 2022-12-25 MED ORDER — FUROSEMIDE 40 MG PO TABS: 40.0000 mg | ORAL_TABLET | Freq: Every day | ORAL | 3 refills | Status: DC

## 2022-12-25 MED ORDER — RIVAROXABAN 15 MG PO TABS: 15.0000 mg | ORAL_TABLET | Freq: Every day | ORAL | 3 refills | Status: DC

## 2022-12-25 MED ORDER — METOPROLOL SUCCINATE ER 50 MG PO TB24: 50.0000 mg | ORAL_TABLET | Freq: Every day | ORAL | 3 refills | Status: DC

## 2022-12-25 NOTE — Progress Notes (Signed)
Follow up visit  Subjective:   Sheena Taylor, female    DOB: 05-13-1934, 88 y.o.   MRN: [id number]     HPI  Chief Complaint  Patient presents with   Atrial Fibrillation   Chronic heart failure with preserved ejection fraction   Follow-up    88 y.o. year-old Caucasian female with hypertension, arthritis, paroxysmal atrial fibrillation, cardiac tamponade requiring emergency pericardiocentesis in 05/2022, recurrent bilateral pleural effusions requiring Rt sided thoracentesis in 05/2022.  Patient is here with her husband. She is doing fairly well. She has gained weight. Which she attributes to eating "good food". She denies any worse than baseline exertional dyspnea. Denies leg edema, orthopnea, PND. She wants to minimize the number of medications she takes. She has come off SGLT2i due to side effects.   Current Outpatient Medications:    acetaminophen (TYLENOL) 500 MG tablet, Take 500-1,000 mg by mouth every 8 (eight) hours as needed (pain)., Disp: , Rfl:    apixaban (ELIQUIS) 5 MG TABS tablet, Take 1 tablet (5 mg total) by mouth 2 (two) times daily., Disp: 180 tablet, Rfl: 3   Calcium Carbonate (CALCIUM 500 PO), Take 500 mg by mouth daily., Disp: , Rfl:    Calcium Citrate-Vitamin D (CALCIUM + D PO), Take 1 tablet by mouth daily., Disp: , Rfl:    furosemide (LASIX) 40 MG tablet, Take 1 tablet (40 mg total) by mouth daily., Disp: 90 tablet, Rfl: 3   metoprolol tartrate (LOPRESSOR) 25 MG tablet, Take 1 tablet (25 mg total) by mouth 2 (two) times daily., Disp: 180 tablet, Rfl: 3   mirtazapine (REMERON) 15 MG tablet, Take 15 mg by mouth at bedtime as needed (sleep)., Disp: , Rfl:    Multiple Vitamins-Minerals (PRESERVISION AREDS) CAPS, Take 1 capsule by mouth in the morning and at bedtime., Disp: , Rfl:    pravastatin (PRAVACHOL) 20 MG tablet, Take 20 mg by mouth daily., Disp: , Rfl:    Cardiovascular & other pertient studies:  Reviewed external labs and tests, independently  interpreted  EKG 10/03/2022: Atrial fibrillation 98 bpm Occasional ectopic ventricular beat   Nonspecific T-abnormality  Cardioversion 08/17/2022: Successful  EKG 08/01/2022: Atrial fibrillation 125 bpm LAFB Old anteroseptal infarct  EKG 07/03/2022: Atrial fibrillation 96 bpm  Nonspecific T-abnormality  Echocardiogram 07/26/2022:  Low-normal LV systolic function with visual EF 55%. Left ventricle cavity is normal in size. Mild concentric hypertrophy of the left ventricle. Normal global wall motion. Indeterminate diastolic filling pattern, indeterminate LAP. Calculated EF 52%. Left atrial cavity is severely dilated at 52.1 ml/m^2. Right atrial cavity is severely dilated. Structurally normal mitral valve.  Moderate (Grade II) mitral regurgitation. Structurally normal tricuspid valve.  Moderate tricuspid regurgitation likely underestimated. No evidence of pulmonary hypertension. RVSP measures 33 mmHg. Pericardium is normal. Small pericardial effusion. No prior available for comparison.  CXR 07/23/2022: 1. Mild cardiomegaly. 2. Chronic emphysematous changes within the upper lungs. 3. Small bilateral pleural effusions, decreased from 06/27/2022.    Recent labs: 08/17/2022: Glucose 98, BUN/Cr 15/0.8. EGFR NA. Na/K 142/3.7. H/H 13/40.  08/06/2022: BNP 233  06/30/2022: Glucose 117, BUN/Cr 18/1.14. EGFR 47. Na/K 131/3.7. Albumin 2.7. Rest of the CMP normal BNP 252, trop HS 4 H/H 10.8/33.7. MCV 91. Platelets 475 HbA1C 5.9% TSH 0.38 normal    Review of Systems  Constitutional: Positive for weight gain.  Cardiovascular:  Positive for dyspnea on exertion (Mild, stable). Negative for chest pain, leg swelling, palpitations and syncope.  Skin:  Negative for rash.  Vitals:   12/25/22 1416  BP: 117/83  Pulse: 100  Resp: 16  SpO2: 97%     Body mass index is 29.58 kg/m. Filed Weights   12/25/22 1416  Weight: 167 lb (75.8 kg)      Objective:   Physical  Exam Vitals and nursing note reviewed.  Constitutional:      General: She is not in acute distress. Neck:     Vascular: No JVD.  Cardiovascular:     Rate and Rhythm: Tachycardia present. Rhythm irregular.     Heart sounds: Normal heart sounds. No murmur heard. Pulmonary:     Effort: Pulmonary effort is normal.     Breath sounds: No wheezing or rales.     Comments: Reduced lung sounds right lower lung Musculoskeletal:     Right lower leg: No edema.     Left lower leg: No edema.             Visit diagnoses:   ICD-10-CM   1. Chronic heart failure with preserved ejection fraction (HCC)  I50.32     2. Persistent atrial fibrillation (HCC)  I48.19      Meds ordered this encounter  Medications   metoprolol succinate (TOPROL-XL) 50 MG 24 hr tablet    Sig: Take 1 tablet (50 mg total) by mouth daily. Take with or immediately following a meal.    Dispense:  90 tablet    Refill:  3   furosemide (LASIX) 40 MG tablet    Sig: Take 1 tablet (40 mg total) by mouth daily.    Dispense:  90 tablet    Refill:  3   Rivaroxaban (XARELTO) 15 MG TABS tablet    Sig: Take 1 tablet (15 mg total) by mouth daily with supper.    Dispense:  90 tablet    Refill:  3      Assessment & Recommendations:   88 y.o. year-old Caucasian female with hypertension, arthritis, paroxysmal atrial fibrillation, cardiac tamponade requiring emergency pericardiocentesis in 05/2022, recurrent bilateral pleural effusions requiring Rt sided thoracentesis in 05/2022.  Persistent A-fib: Persistent, likely permanent Afib. Rate reasonably well controlled. CHA2DS2VASc score at least 4, annual stroke risk 5%.  Continue Eliquis 5 mg twice daily. For ease of administration. Change metoprolol tartrate 25 mg bid to metoprolol succinate 50 mg daily. Also, change Eliquis 5 mg bid to Xarelto 15 mg daily. On a separate note, okay to stop pravastatin.  HFpEF: Recent weight gain not withstanding, patient appears fairly  euvolemic.  I do think weight gain could truly be related to calorie intake.  Discussed diet modifications for weight loss.  Continue lasix 40 mg daily, does not want to take Jardiance.  F/u in 6 months     Elder Negus, MD Pager: [phone] Office: [phone]

## 2023-01-03 ENCOUNTER — Ambulatory Visit: Payer: Medicare Other | Admitting: Cardiology

## 2023-01-17 ENCOUNTER — Telehealth: Payer: Self-pay | Admitting: Cardiology

## 2023-01-17 NOTE — Telephone Encounter (Signed)
Pt called in stating [facility] messed her and her husbands medications and she wants a nurse to call her and tell her what medications she should be taking.

## 2023-01-17 NOTE — Telephone Encounter (Signed)
Will close this encounter and document on her husbands chart since call is about his meds.

## 2023-01-18 NOTE — Telephone Encounter (Signed)
Patient stated [facility] gave her a prescription for diltiazem 120 mg, and she was wondering if she was prescribed this. Informed patient that back in May Dr. Rosemary Holms took her off this medication, and the pharmacy was probably just refilling it. Informed patient that she is not on this medication. Patient verbalized understanding.

## 2023-03-11 ENCOUNTER — Telehealth: Payer: Self-pay | Admitting: Cardiology

## 2023-03-11 NOTE — Telephone Encounter (Signed)
Noted ? ?Thanks ?MJP ? ?

## 2023-03-11 NOTE — Telephone Encounter (Signed)
Spoke with Sheena Taylor and Sheena Taylor's neighbor who report increasing SOB x 3-4 weeks worse on exertion.  Sheena Taylor states SOB woke her this morning and she had to sit on the side of the bed to catch her breath.  She is not currently in any distress.  Sheena Taylor denies CP, dizziness or edema and reports she continues to urinate briskly with Furosemide 40mg  daily.  She is currently taking medications as prescribed but did miss 2 days of medications when she went out of town 3-4 weeks ago.  Current BP 121/90 with HR - 100.  She is not sure if she is in Afib or not. Appointment scheduled for 03/12/2023 with Dr Rosemary Holms. Sheena Taylor advised will forward to Dr Rosemary Holms for review and any further recommendation prior to appt.   Sheena Taylor verbalizes understanding and agrees with current plan.

## 2023-03-11 NOTE — Telephone Encounter (Signed)
Pt c/o Shortness Of Breath: STAT if SOB developed within the last 24 hours or pt is noticeably SOB on the phone  1. Are you currently SOB (can you hear that pt is SOB on the phone)? Yes   2. How long have you been experiencing SOB? Past 3 to 4 weeks  3. Are you SOB when sitting or when up moving around? Both   4. Are you currently experiencing any other symptoms? BP reading was 180/90; 100;

## 2023-03-12 ENCOUNTER — Ambulatory Visit
Admission: RE | Admit: 2023-03-12 | Discharge: 2023-03-12 | Disposition: A | Discharge status: Home / Self Care | Payer: Medicare Other | Source: Ambulatory Visit | Attending: Cardiology | Admitting: Cardiology

## 2023-03-12 ENCOUNTER — Ambulatory Visit: Payer: Medicare Other | Attending: Cardiology | Admitting: Cardiology

## 2023-03-12 ENCOUNTER — Encounter: Payer: Self-pay | Admitting: Cardiology

## 2023-03-12 VITALS — BP 140/74 | HR 56 | Resp 16 | Ht 64.0 in | Wt 167.8 lb

## 2023-03-12 DIAGNOSIS — I4891 Unspecified atrial fibrillation: Secondary | ICD-10-CM

## 2023-03-12 DIAGNOSIS — I4811 Longstanding persistent atrial fibrillation: Secondary | ICD-10-CM | POA: Diagnosis not present

## 2023-03-12 DIAGNOSIS — R0609 Other forms of dyspnea: Secondary | ICD-10-CM

## 2023-03-12 MED ORDER — FUROSEMIDE 40 MG PO TABS: 40.0000 mg | ORAL_TABLET | Freq: Two times a day (BID) | ORAL | 0 refills | Status: DC

## 2023-03-12 NOTE — Patient Instructions (Addendum)
Medication Instructions:   INCREASE YOUR LASIX TO 40 MG BY MOUTH TWICE DAILY  *If you need a refill on your cardiac medications before your next appointment, please call your pharmacy*   Lab Work:  MONDAY MORNING ON 03/18/23 HERE IN THE OFFICE--BMET  If you have labs (blood work) drawn today and your tests are completely normal, you will receive your results only by: MyChart Message (if you have MyChart) OR A paper copy in the mail If you have any lab test that is abnormal or we need to change your treatment, we will call you to review the results.   Testing/Procedures:  Your physician has requested that you have an STAT echocardiogram. Echocardiography is a painless test that uses sound waves to create images of your heart. It provides your doctor with information about the size and shape of your heart and how well your heart's chambers and valves are working. This procedure takes approximately one hour. There are no restrictions for this procedure.  STAT ECHO TO BE DONE TODAY OR TOMORROW AT LATEST PER DR. PATWARDHAN   Please do NOT wear cologne, perfume, aftershave, or lotions (deodorant is allowed). Please arrive 15 minutes prior to your appointment time.  Please note: We ask at that you not bring children with you during ultrasound (echo/ vascular) testing. Due to room size and safety concerns, children are not allowed in the ultrasound rooms during exams. Our front office staff cannot provide observation of children in our lobby area while testing is being conducted. An adult accompanying a patient to their appointment will only be allowed in the ultrasound room at the discretion of the ultrasound technician under special circumstances. We apologize for any inconvenience.    Your provider recommends you have a CHEST X-RAY.  PLEASE GO TODAY TO GET THIS DONE Please proceed to [facility] at [facility]: Address: [address] [place]  Phone: [phone] You do  not need an appointment. Business hours are M-F 8:00AM to 5:00PM. There are no restrictions for this test.     Follow-Up:  WITH DR. PATWARDHAN NEXT MONDAY 03/18/23 AT 2:20 PM SLOT--OK TO TAKE HELD SLOT--PLEASE MAKE SURE YOU HAVE YOUR LAB WORK DONE EARLIER IN THE MORNING THAT DAY, SO THE RESULT IS BACK BY YOUR APPOINTMENT TIME THAT AFTERNOON

## 2023-03-12 NOTE — Progress Notes (Signed)
Cardiology Office Note:  .   Date:  03/12/2023  ID:  Sheena Taylor, DOB 04-23-1934, MRN [id number] PCP: Creola Corn, MD  [facility] Providers Cardiologist:  Truett Mainland, MD PCP: Creola Corn, MD  Chief Complaint  Patient presents with   Chronic heart failure with preserved ejection fraction    Persistent atrial fibrillation    Shortness of Breath      History of Present Illness: .    Sheena Taylor is a 88 y.o. female with hypertension, arthritis, paroxysmal atrial fibrillation, cardiac tamponade requiring emergency pericardiocentesis in 05/2022, recurrent bilateral pleural effusions requiring Rt sided thoracentesis in 05/2022.   Patient's friend Ms. Gunnar Fusi made today's urgent appointment due to increasing shortness of breath on exertion for past 3-4 weeks.  She denies any leg edema, has been urinating briskly with furosemide 40 mg daily.  Vitals:   03/12/23 1313  BP: (!) 140/74  Pulse: (!) 56  Resp: 16  SpO2: 93%   Heart rate is a erroneously under counted.  Ventricular rate on EKG is 115 bpm   ROS:  Review of Systems  Cardiovascular:  Positive for dyspnea on exertion. Negative for chest pain, leg swelling, palpitations and syncope.     Studies Reviewed: Marland Kitchen        EKG 03/12/2023: Atrial fibrillation with rapid ventricular response with premature ventricular or aberrantly conducted complexes Left axis deviation When compared with ECG of 17-Aug-2022 11:28, Atrial fibrillation has replaced Sinus rhythm Vent. rate has increased BY  59 BPM  Risk Assessment/Calculations:    CHA2DS2-VASc Score = 5   This indicates a 7.2 % annual risk of stroke.    Physical Exam:   Physical Exam Vitals and nursing note reviewed.  Constitutional:      General: She is not in acute distress. Neck:     Vascular: JVD present.  Cardiovascular:     Rate and Rhythm: Tachycardia present. Rhythm irregularly irregular.     Heart sounds: Normal heart sounds. No  murmur heard. Pulmonary:     Effort: Pulmonary effort is normal.     Breath sounds: Normal breath sounds. No wheezing or rales.  Musculoskeletal:     Right lower leg: No edema.     Left lower leg: No edema.      VISIT DIAGNOSES:   ICD-10-CM   1. Longstanding persistent atrial fibrillation (HCC)  I48.11 EKG 12-Lead    ECHOCARDIOGRAM COMPLETE    DG Chest 2 View    Basic metabolic panel    Basic metabolic panel    2. Exertional dyspnea  R06.09 ECHOCARDIOGRAM COMPLETE    DG Chest 2 View    Basic metabolic panel    Basic metabolic panel    3. Atrial fibrillation with rapid ventricular response (HCC)  I48.91        ASSESSMENT AND PLAN: .    Sheena Taylor is a 88 y.o. female with  hypertension, arthritis, paroxysmal atrial fibrillation, cardiac tamponade requiring emergency pericardiocentesis in 05/2022, recurrent bilateral pleural effusions requiring Rt sided thoracentesis in 05/2022.   Exertional dyspnea: Progressively worsening exertional dyspnea for past 4 weeks.  No edema, but JVD present on exam.  She is in A-fib with RVR.  While heart sounds are not particularly distant, patient has had prior history of large pericardial effusion presenting with A-fib with RVR.  Recommend stat chest x-ray, echocardiogram.  For now, increase Lasix from 40 mg daily to 40 mg twice daily.  If no pericardial effusion found, we will then  focus on A-fib rate controlled and heart failure management more aggressively.  Persistent A-fib: Persistent, likely permanent Afib. Rate reasonably well controlled. CHA2DS2VASc score at least 5 annual stroke risk 7.2%.  Continue Eliquis 5 mg twice daily. Currently on metoprolol succinate 50 mg daily, Xarelto 15 mg daily. If no pericardial effusion noted, I will then start her on diltiazem 120 mg daily. Will check BMP in 1 week.        Meds ordered this encounter  Medications   furosemide (LASIX) 40 MG tablet    Sig: Take 1 tablet (40 mg total) by mouth  2 (two) times daily.    Dispense:  60 tablet    Refill:  0    Dose increase     F/u in 1 week  Signed, Elder Negus, MD

## 2023-03-13 ENCOUNTER — Ambulatory Visit (HOSPITAL_COMMUNITY)
Admission: RE | Admit: 2023-03-13 | Discharge: 2023-03-13 | Disposition: A | Discharge status: Home / Self Care | Payer: Medicare Other | Source: Ambulatory Visit | Attending: Cardiology | Admitting: Cardiology

## 2023-03-13 ENCOUNTER — Telehealth: Payer: Self-pay | Admitting: *Deleted

## 2023-03-13 DIAGNOSIS — I4811 Longstanding persistent atrial fibrillation: Secondary | ICD-10-CM | POA: Diagnosis present

## 2023-03-13 DIAGNOSIS — R0609 Other forms of dyspnea: Secondary | ICD-10-CM | POA: Insufficient documentation

## 2023-03-13 LAB — ECHOCARDIOGRAM COMPLETE
AR max vel: 2.09 cm2
AV Area VTI: 2.34 cm2
AV Area mean vel: 2.47 cm2
AV Mean grad: 2.9 mm[Hg]
AV Peak grad: 6.1 mm[Hg]
Ao pk vel: 1.23 m/s
Area-P 1/2: 4.21 cm2
Calc EF: 46.3 %
Est EF: 50
MV M vel: 5.03 m/s
MV Peak grad: 101.2 mm[Hg]
S' Lateral: 3.2 cm
Single Plane A2C EF: 48.5 %
Single Plane A4C EF: 46.7 %

## 2023-03-13 MED ORDER — DILTIAZEM HCL ER COATED BEADS 120 MG PO CP24: 120.0000 mg | ORAL_CAPSULE | Freq: Every day | ORAL | 2 refills | Status: DC

## 2023-03-13 NOTE — Progress Notes (Signed)
*  PRELIMINARY RESULTS* Echocardiogram 2D Echocardiogram has been performed.  Stacey Drain 03/13/2023, 9:15 AM

## 2023-03-13 NOTE — Telephone Encounter (Signed)
The patient and husband has been notified of the result and verbalized understanding.  All questions (if any) were answered.  Both parties aware that the pt should continue  lasix 40 mg po bid as advised at yesterday's office visit, and we will start her on diltiazem 120 mg po daily.    Advised them both to keep her lab appt in the morning on 12/2 and see [place] that afternoon in the clinic at 2:20 pm.   Confirmed the pharmacy of choice with the pt and husband.  Both verbalized verbalized understanding and agrees with this plan.

## 2023-03-13 NOTE — Telephone Encounter (Signed)
-----   Message from [facility] sent at 03/13/2023  2:20 PM EST ----- No pericardial effusion. Continue lasix 40 mg bid. Please add diltiazem 120 mg daily. Happy thanksgiving!  Thanks MJP

## 2023-03-13 NOTE — Progress Notes (Signed)
No pericardial effusion. Continue lasix 40 mg bid. Please add diltiazem 120 mg daily. Happy thanksgiving!  Thanks MJP

## 2023-03-18 ENCOUNTER — Ambulatory Visit: Payer: Medicare Other

## 2023-03-18 ENCOUNTER — Ambulatory Visit: Payer: Medicare Other | Attending: Cardiology | Admitting: Cardiology

## 2023-03-18 ENCOUNTER — Encounter: Payer: Self-pay | Admitting: Cardiology

## 2023-03-18 VITALS — BP 118/88 | HR 64 | Resp 16 | Ht 64.0 in | Wt 162.0 lb

## 2023-03-18 DIAGNOSIS — R0609 Other forms of dyspnea: Secondary | ICD-10-CM | POA: Diagnosis not present

## 2023-03-18 LAB — BASIC METABOLIC PANEL
BUN/Creatinine Ratio: 26 (ref 12–28)
BUN: 25 mg/dL (ref 8–27)
CO2: 29 mmol/L (ref 20–29)
Calcium: 10.3 mg/dL (ref 8.7–10.3)
Chloride: 98 mmol/L (ref 96–106)
Creatinine, Ser: 0.98 mg/dL (ref 0.57–1.00)
Glucose: 102 mg/dL — ABNORMAL HIGH (ref 70–99)
Potassium: 3.7 mmol/L (ref 3.5–5.2)
Sodium: 141 mmol/L (ref 134–144)
eGFR: 56 mL/min/{1.73_m2} — ABNORMAL LOW (ref >59)

## 2023-03-18 MED ORDER — FUROSEMIDE 40 MG PO TABS: 40.0000 mg | ORAL_TABLET | Freq: Every day | ORAL | Status: DC

## 2023-03-18 NOTE — Progress Notes (Signed)
Cardiology Office Note:  .   Date:  03/18/2023  ID:  Sheena Taylor, DOB June 09, 1934, MRN [id number] PCP: Creola Corn, MD  [facility] Providers Cardiologist:  Truett Mainland, MD PCP: Creola Corn, MD  Chief Complaint  Patient presents with   Results   Follow-up      History of Present Illness: .    Sheena Taylor is a 88 y.o. female with  hypertension, arthritis, paroxysmal atrial fibrillation, cardiac tamponade requiring emergency pericardiocentesis in 05/2022, recurrent bilateral pleural effusions requiring Rt sided thoracentesis in 05/2022.   I saw the patient last week for worsening shortness of breath symptoms.  Obtain stat echocardiogram and chest x-ray, that excluded pericardial fusion or pulmonary edema.  I have recommended increased dose of Lasix given JVD on exam, and also added diltiazem for A-fib rate control.  Patient is here today with her husband and son. She has declined after increasing dose of Lasix and has lost 5 pounds at the last visit. In spite of that, she continues to have exertional dyspnea with minimal activity.  She denies any chest pain  Vitals:   03/18/23 1414  BP: 118/88  Pulse: 64  Resp: 16  SpO2: 94%     ROS:  Review of Systems  Cardiovascular:  Positive for dyspnea on exertion. Negative for chest pain, leg swelling, palpitations and syncope.     Studies Reviewed: .        Echocardiogram 03/13/2023: 1. Left ventricular ejection fraction, by estimation, is 50%. The left  ventricle has low normal function. The left ventricle demonstrates global  hypokinesis. Left ventricular diastolic parameters are indeterminate.   2. Right ventricular systolic function is normal. The right ventricular  size is normal. There is normal pulmonary artery systolic pressure.   3. Left atrial size was severely dilated.   4. Right atrial size was severely dilated.   5. The mitral valve is normal in structure. No evidence of mitral valve   regurgitation. No evidence of mitral stenosis.   6. The tricuspid valve is abnormal.   7. The aortic valve is tricuspid. There is mild calcification of the  aortic valve. There is mild thickening of the aortic valve. Aortic valve  regurgitation is not visualized. No aortic stenosis is present.   8. The inferior vena cava is normal in size with greater than 50%  respiratory variability, suggesting right atrial pressure of 3 mmHg.   Risk Assessment/Calculations:    CHA2DS2-VASc Score = 5  This indicates a 7.2% annual risk of stroke. The patient's score is based upon: CHF History: 1 HTN History: 1 Diabetes History: 0 Stroke History: 0 Vascular Disease History: 0 Age Score: 2 Gender Score: 1      Physical Exam:   Physical Exam Vitals and nursing note reviewed.  Constitutional:      General: She is not in acute distress. Neck:     Vascular: No JVD.  Cardiovascular:     Rate and Rhythm: Normal rate. Rhythm irregular.     Heart sounds: Normal heart sounds. No murmur heard. Pulmonary:     Effort: Pulmonary effort is normal.     Breath sounds: Normal breath sounds. No wheezing or rales.  Musculoskeletal:     Right lower leg: No edema.     Left lower leg: No edema.      VISIT DIAGNOSES:   ICD-10-CM   1. Exertional dyspnea  R06.09 Basic metabolic panel    Pro b natriuretic peptide (BNP)    MYOCARDIAL  PERFUSION IMAGING    Pro b natriuretic peptide (BNP)    Basic metabolic panel    Cardiac Stress Test: Informed Consent Details: Physician/Practitioner Attestation; Transcribe to consent form and obtain patient signature       ASSESSMENT AND PLAN: .    Sheena Taylor is a 88 y.o. female with  hypertension, arthritis, paroxysmal atrial fibrillation, cardiac tamponade requiring emergency pericardiocentesis in 05/2022, recurrent bilateral pleural effusions requiring Rt sided thoracentesis in 05/2022.  Exertional dyspnea: Chest x-ray unremarkable, no pericardial effusion  on echocardiogram. No signs of fluid overload on exam today.   Persistent exertional dyspnea diuresis. Recommend Lexiscan nuclear stress test for evaluation for obstructive CAD, with possibility of angina equivalent presentation. Will also check BMP, proBNP with that. In the meantime, okay to reduce Lasix back to 40 mg daily, with additional dose of 40 mg daily only in case of increasing weight gain out of shortness of breath.   Persistent A-fib: Persistent, likely permanent Afib. Rate reasonably well controlled. Continue metoprolol succinate 50 mg daily, diltiazem 120 mg daily. CHA2DS2VASc score at least 5 annual stroke risk 7.2%.   Continue Xarelto 15 mg daily.    Informed Consent   Shared Decision Making/Informed Consent The risks [chest pain, shortness of breath, cardiac arrhythmias, dizziness, blood pressure fluctuations, myocardial infarction, stroke/transient ischemic attack, nausea, vomiting, allergic reaction, radiation exposure, metallic taste sensation and life-threatening complications (estimated to be 1 in 10,000)], benefits (risk stratification, diagnosing coronary artery disease, treatment guidance) and alternatives of a nuclear stress test were discussed in detail with Sheena Taylor and she agrees to proceed.       Meds ordered this encounter  Medications   furosemide (LASIX) 40 MG tablet    Sig: Take 1 tablet (40 mg total) by mouth daily. Take an additional dose as needed.    Dose increase     F/u in 06/2023  Signed, Elder Negus, MD

## 2023-03-18 NOTE — Patient Instructions (Addendum)
Medication Instructions:  Your physician recommends that you continue on your current medications as directed. Please refer to the Current Medication list given to you today.  *If you need a refill on your cardiac medications before your next appointment, please call your pharmacy*  Lab Work: To be completed on the day of your stress test: BMP, Pro-BNP  If you have labs (blood work) drawn today and your tests are completely normal, you will receive your results only by: MyChart Message (if you have MyChart) OR A paper copy in the mail If you have any lab test that is abnormal or we need to change your treatment, we will call you to review the results.  Testing/Procedures: Your physician has requested that you have a lexiscan myoview. For further information please visit [email or web address] Please follow instruction sheet, as given.   Follow-Up: At [facility], you and your health needs are our priority.  As part of our continuing mission to provide you with exceptional heart care, we have created designated Provider Care Teams.  These Care Teams include your primary Cardiologist (physician) and Advanced Practice Providers (APPs -  Physician Assistants and Nurse Practitioners) who all work together to provide you with the care you need, when you need it.  Your next appointment:   3 month(s) (March 2025) - appointment already scheduled   The format for your next appointment:   In Person  Provider:   Dr. Rosemary Holms {  Other Instructions Eugenie Birks Myoview (Stress Test) Instructions  Please arrive 15 minutes prior to your appointment time for registration and insurance purposes.   The test will take approximately 3 to 4 hours to complete; you may bring reading material.  If someone comes with you to your appointment, they will need to remain in the main lobby due to limited space in the testing area. **If you are pregnant or breastfeeding, please notify the nuclear lab prior to your  appointment**   How to prepare for your Myocardial Perfusion Test: Do not eat or drink 3 hours prior to your test, except you may have water. Do not consume products containing caffeine (regular or decaffeinated) 12 hours prior to your test. (ex: coffee, chocolate, sodas, tea). Do bring a list of your current medications with you.  If not listed below, you may take your medications as normal. Do wear comfortable clothes (no dresses or overalls) and walking shoes, tennis shoes preferred (No heels or open toe shoes are allowed). Do NOT wear cologne, perfume, aftershave, or lotions (deodorant is allowed). If these instructions are not followed, your test will have to be rescheduled.   Please report to [address], [address] for your test.  If you have questions or concerns about your appointment, you can call the Nuclear Lab at [phone].   If you cannot keep your appointment, please provide 24 hours notification to the Nuclear Lab, to avoid a possible $50 charge to your account.

## 2023-03-19 ENCOUNTER — Telehealth (HOSPITAL_COMMUNITY): Payer: Self-pay | Admitting: *Deleted

## 2023-03-19 NOTE — Telephone Encounter (Signed)
Patient given detailed instructions per Myocardial Perfusion Study Information Sheet for the test on 03/27/23 Patient notified to arrive 15 minutes early and that it is imperative to arrive on time for appointment to keep from having the test rescheduled.  If you need to cancel or reschedule your appointment, please call the office within 24 hours of your appointment. . Patient verbalized understanding. Ricky Ala

## 2023-03-21 ENCOUNTER — Telehealth (HOSPITAL_COMMUNITY): Payer: Self-pay

## 2023-03-25 NOTE — Telephone Encounter (Signed)
Patient inquiring about arrival time for MPI

## 2023-03-27 ENCOUNTER — Ambulatory Visit (HOSPITAL_COMMUNITY): Payer: Medicare Other | Attending: Cardiology

## 2023-03-27 DIAGNOSIS — R0609 Other forms of dyspnea: Secondary | ICD-10-CM | POA: Diagnosis present

## 2023-03-27 LAB — MYOCARDIAL PERFUSION IMAGING
LV dias vol: 86 mL (ref 46–106)
LV sys vol: 50 mL
Nuc Stress EF: 41 %
Peak HR: 120 {beats}/min
Rest HR: 113 {beats}/min
Rest Nuclear Isotope Dose: 10.5 mCi
SDS: 0
SRS: 0
SSS: 0
ST Depression (mm): 0 mm
Stress Nuclear Isotope Dose: 31.6 mCi
TID: 1.12

## 2023-03-27 MED ORDER — TECHNETIUM TC 99M TETROFOSMIN IV KIT
10.5000 | PACK | Freq: Once | INTRAVENOUS | Status: AC | PRN
Start: 2023-03-27 — End: 2023-03-27
  Administered 2023-03-27: 10.5 via INTRAVENOUS

## 2023-03-27 MED ORDER — TECHNETIUM TC 99M TETROFOSMIN IV KIT
31.6000 | PACK | Freq: Once | INTRAVENOUS | Status: AC | PRN
Start: 2023-03-27 — End: 2023-03-27
  Administered 2023-03-27: 31.6 via INTRAVENOUS

## 2023-03-27 MED ORDER — REGADENOSON 0.4 MG/5ML IV SOLN
0.4000 mg | Freq: Once | INTRAVENOUS | Status: AC
  Administered 2023-03-27: 0.4 mg via INTRAVENOUS

## 2023-03-28 ENCOUNTER — Telehealth: Payer: Self-pay | Admitting: *Deleted

## 2023-03-28 LAB — BASIC METABOLIC PANEL
BUN/Creatinine Ratio: 21 (ref 12–28)
BUN: 23 mg/dL (ref 8–27)
CO2: 27 mmol/L (ref 20–29)
Calcium: 9.5 mg/dL (ref 8.7–10.3)
Chloride: 102 mmol/L (ref 96–106)
Creatinine, Ser: 1.1 mg/dL — ABNORMAL HIGH (ref 0.57–1.00)
Glucose: 97 mg/dL (ref 70–99)
Potassium: 4 mmol/L (ref 3.5–5.2)
Sodium: 145 mmol/L — ABNORMAL HIGH (ref 134–144)
eGFR: 49 mL/min/{1.73_m2} — ABNORMAL LOW (ref >59)

## 2023-03-28 LAB — PRO B NATRIURETIC PEPTIDE: NT-Pro BNP: 1118 pg/mL — ABNORMAL HIGH (ref 0–738)

## 2023-03-28 MED ORDER — FUROSEMIDE 40 MG PO TABS: 40.0000 mg | ORAL_TABLET | Freq: Two times a day (BID) | ORAL | 0 refills | Status: DC

## 2023-03-28 NOTE — Telephone Encounter (Signed)
The patient and has been notified of the result and verbalized understanding.  All questions (if any) were answered.  Pt states she does not want to go back on Jardiance and wants to stay on her lasix regimen.  Pt states at last OV Dr. Rosemary Holms advised her to take lasix 40 po daily with an additional dose to take as needed for wt gain.  Pt states since seeing him, she has on most days taken lasix 40 mg po bid. She states the bid dosing has helped her weights and symptoms tremendously.  She states taking it twice daily has kept her weights down, with last weight this morning at 160 lbs.   Pt aware to continue lasix 40 mg po bid and I will make Dr. Rosemary Holms aware that she has been taking bid dosing since last seeing him in the clinic.  Pt is aware if he has any additional recommendations, I will call her accordingly thereafter.   Pt is aware that I will send in lasix 40 mg po bid to her pharmacy (supply running low), and will let her know if Dr. Rosemary Holms wants to add or change anything else to her medication regimen.  Pt verbalized understanding and agrees with this plan.

## 2023-03-28 NOTE — Progress Notes (Signed)
Sheena Taylor,   given the physical exam did not show it, she does have mild congestion based on these labs. Please ask her if she would be willing to go back on Jardiance 10 mg daily for HFpEF. Continue Lasix 40 mg daily. If not willing to go back on Jardiance, increase Lasix back to 40 mg twice daily.  Thanks MJP

## 2023-03-28 NOTE — Telephone Encounter (Signed)
-----   Message from [facility] sent at 03/28/2023  1:02 PM EST ----- Lajoyce Corners,   given the physical exam did not show it, she does have mild congestion based on these labs. Please ask her if she would be willing to go back on Jardiance 10 mg daily for HFpEF. Continue Lasix 40 mg daily. If not willing to go back on Jardiance, increase Lasix back to 40 mg twice daily.  Thanks MJP

## 2023-03-28 NOTE — Progress Notes (Signed)
No significant heart muscle circulation abnormalities noted on stress test. I do think shortness of breath is due to heart failure with preserved ejection fraction. See other note regarding medication management.  Thanks MJP

## 2023-03-29 ENCOUNTER — Telehealth: Payer: Self-pay | Admitting: *Deleted

## 2023-03-29 MED ORDER — FUROSEMIDE 40 MG PO TABS: ORAL_TABLET | ORAL | 3 refills | Status: DC

## 2023-03-29 NOTE — Progress Notes (Signed)
Consider increasing Lasix to 80 mg in a.m. and 40 mg in the p.m.  Thanks MJP

## 2023-03-29 NOTE — Telephone Encounter (Signed)
-----   Message from [facility] sent at 03/29/2023 12:11 PM EST ----- Consider increasing Lasix to 80 mg in a.m. and 40 mg in the p.m.  Thanks MJP

## 2023-03-29 NOTE — Telephone Encounter (Signed)
Spoke with the pts Husband Sheena Taylor (on [place] and handles pts medications/appts/results) and informed him that per Dr. Rosemary Holms, being she has been routinely taking the lasix 40 mg po bid, we will now increase her to lasix 80 mg in the AM and 40 mg po in the PM.   Advised him that they can use supply on hand first, but I will send this increased dose to the pts confirmed pharmacy.  Sheena Taylor verbalized understanding and agrees with this plan.  He will make the pt aware of this when she returns home.   Sheena Taylor was gracious for all the assistance provided.   Also called [facility] and made them aware of dose increase.  Pharmacist at [facility] will fill the lasix 80 mg in AM and 40 mg in PM, today for the pt.

## 2023-06-24 ENCOUNTER — Ambulatory Visit: Payer: Self-pay | Attending: Cardiology | Admitting: Cardiology

## 2023-06-24 ENCOUNTER — Other Ambulatory Visit: Payer: Self-pay | Admitting: Cardiology

## 2023-06-24 ENCOUNTER — Encounter: Payer: Self-pay | Admitting: Cardiology

## 2023-06-24 VITALS — BP 104/62 | HR 117 | Ht 64.0 in | Wt 163.2 lb

## 2023-06-24 DIAGNOSIS — I4819 Other persistent atrial fibrillation: Secondary | ICD-10-CM | POA: Diagnosis not present

## 2023-06-24 DIAGNOSIS — I5032 Chronic diastolic (congestive) heart failure: Secondary | ICD-10-CM | POA: Diagnosis not present

## 2023-06-24 MED ORDER — DAPAGLIFLOZIN PROPANEDIOL 10 MG PO TABS: 10.0000 mg | ORAL_TABLET | Freq: Every day | ORAL | Status: DC

## 2023-06-24 MED ORDER — FUROSEMIDE 40 MG PO TABS: 40.0000 mg | ORAL_TABLET | Freq: Every morning | ORAL | 3 refills | Status: DC

## 2023-06-24 MED ORDER — DAPAGLIFLOZIN PROPANEDIOL 10 MG PO TABS: 10.0000 mg | ORAL_TABLET | Freq: Every day | ORAL | 2 refills | Status: DC

## 2023-06-24 NOTE — Progress Notes (Signed)
 Cardiology Office Note:  .   Date:  06/24/2023  ID:  EDUARDO WURTH, DOB 1935/04/11, MRN [id number] PCP: Creola Corn, MD  [facility] Providers Cardiologist:  Truett Mainland, MD PCP: Creola Corn, MD  Chief Complaint  Patient presents with   Atrial Fibrillation      History of Present Illness: .    Sheena Taylor is a 89 y.o. female with  hypertension, arthritis, paroxysmal atrial fibrillation, cardiac tamponade requiring emergency pericardiocentesis in 05/2022, recurrent bilateral pleural effusions requiring Rt sided thoracentesis in 05/2022.   proBNP was elevated in 03/2023.  She continues to have shortness of breath with minimal exertion.  She denies any orthopnea, medical negative for symptoms.  Vitals:   06/24/23 1008  BP: 104/62  Pulse: (!) 117  SpO2: 94%      ROS:  Review of Systems  Cardiovascular:  Positive for dyspnea on exertion. Negative for chest pain, leg swelling, palpitations and syncope.     Studies Reviewed: Marland Kitchen        EKG 06/24/2023: Atrial fibrillation with rapid ventricular response Left axis deviation When compared with ECG of 12-Mar-2023 13:17, No significant change was found    Independently interpreted 04/2023: Chol 211, TG 116, HDL 70, LDL 118 HbA1C 5.3% Hb 13.6 Cr 1.1 TSH 1.67  03/27/2023: NT-ProBNP 1118  Echocardiogram 03/13/2023: 1. Left ventricular ejection fraction, by estimation, is 50%. The left  ventricle has low normal function. The left ventricle demonstrates global  hypokinesis. Left ventricular diastolic parameters are indeterminate.   2. Right ventricular systolic function is normal. The right ventricular  size is normal. There is normal pulmonary artery systolic pressure.   3. Left atrial size was severely dilated.   4. Right atrial size was severely dilated.   5. The mitral valve is normal in structure. No evidence of mitral valve  regurgitation. No evidence of mitral stenosis.   6. The tricuspid  valve is abnormal.   7. The aortic valve is tricuspid. There is mild calcification of the  aortic valve. There is mild thickening of the aortic valve. Aortic valve  regurgitation is not visualized. No aortic stenosis is present.   8. The inferior vena cava is normal in size with greater than 50%  respiratory variability, suggesting right atrial pressure of 3 mmHg.   Risk Assessment/Calculations:    CHA2DS2-VASc Score = 5  This indicates a 7.2% annual risk of stroke. The patient's score is based upon: CHF History: 1 HTN History: 1 Diabetes History: 0 Stroke History: 0 Vascular Disease History: 0 Age Score: 2 Gender Score: 1     Physical Exam:   Physical Exam Vitals and nursing note reviewed.  Constitutional:      General: She is not in acute distress. Neck:     Vascular: No JVD.  Cardiovascular:     Rate and Rhythm: Tachycardia present. Rhythm irregular.     Heart sounds: Normal heart sounds. No murmur heard. Pulmonary:     Effort: Pulmonary effort is normal.     Breath sounds: Normal breath sounds. No wheezing or rales.  Musculoskeletal:     Right lower leg: No edema.     Left lower leg: No edema.  Neurological:     Coordination: Abnormal coordination: HFpEF:.      VISIT DIAGNOSES:   ICD-10-CM   1. Chronic heart failure with preserved ejection fraction (HCC)  I50.32 EKG 12-Lead    2. Persistent atrial fibrillation (HCC)  I48.19 EKG 12-Lead  ASSESSMENT AND PLAN: .    Sheena Taylor is a 89 y.o. female with  hypertension, arthritis, paroxysmal atrial fibrillation, cardiac tamponade requiring emergency pericardiocentesis in 05/2022, recurrent bilateral pleural effusions requiring Rt sided thoracentesis in 05/2022.HFpEF  HFpEF: Physical exam unremarkable, proBNP has been elevated previously.  Although some degree of proBNP elevation could be attributed to A-fib, symptoms are concerning for HFpEF. She is currently taking Lasix 4 mg twice daily. Will  change to Lasix 40 mg a.m., and add Farxiga 10 mg in AM. Discussed possible GU side effects Encouraged to drink at least 2 L of water every day. Check BMP and proBNP in 3-4 weeks. Follow-up in 6 weeks.   Persistent A-fib: Persistent, likely permanent Afib. Rate reasonably well controlled. Continue metoprolol succinate 50 mg daily, diltiazem 120 mg daily. CHA2DS2VASc score at least 5 annual stroke risk 7.2%.   Continue Xarelto 15 mg daily.     No orders of the defined types were placed in this encounter.    F/u in 06/2023  Signed, Elder Negus, MD

## 2023-06-24 NOTE — Patient Instructions (Addendum)
 Medication Instructions:  START Farxiga 10 mg take one tablet by mouth in the morning           - Samples given today   CHANGE Furosemide to 40 mg take one tablet by mouth in the morning   *If you need a refill on your cardiac medications before your next appointment, please call your pharmacy*   Lab Work IN 3 WEEKS: Bmp Probnp   If you have labs (blood work) drawn today and your tests are completely normal, you will receive your results only by: MyChart Message (if you have MyChart) OR A paper copy in the mail If you have any lab test that is abnormal or we need to change your treatment, we will call you to review the results.   Follow-Up: At [facility], you and your health needs are our priority.  As part of our continuing mission to provide you with exceptional heart care, we have created designated Provider Care Teams.  These Care Teams include your primary Cardiologist (physician) and Advanced Practice Providers (APPs -  Physician Assistants and Nurse Practitioners) who all work together to provide you with the care you need, when you need it.    Your next appointment:   6 week(s)  Provider:   Elder Negus, MD     Other Instructions     1st Floor: - Lobby - Registration  - Pharmacy  - Lab - Cafe  2nd Floor: - PV Lab - Diagnostic Testing (echo, CT, nuclear med)  3rd Floor: - Vacant  4th Floor: - TCTS (cardiothoracic surgery) - AFib Clinic - Structural Heart Clinic - Vascular Surgery  - Vascular Ultrasound  5th Floor: - HeartCare Cardiology (general and EP) - Clinical Pharmacy for coumadin, hypertension, lipid, weight-loss medications, and med management appointments    Valet parking services will be available as well.

## 2023-06-26 NOTE — Telephone Encounter (Signed)
 Pharmacy comment: Alternative Requested:THE PRESCRIBED MEDICATION IS NOT COVERED BY INSURANCE. PLEASE CONSIDER CHANGING TO ONE OF THE SUGGESTED COVERED ALTERNATIVES.

## 2023-06-27 ENCOUNTER — Other Ambulatory Visit (HOSPITAL_COMMUNITY): Payer: Self-pay

## 2023-06-27 ENCOUNTER — Telehealth: Payer: Self-pay | Admitting: Pharmacy Technician

## 2023-06-27 NOTE — Telephone Encounter (Signed)
 I apologize we do not prescribe jardiance 25 mg tablets. This request should have been since to pt's PCP. Thanks [employer]

## 2023-06-27 NOTE — Telephone Encounter (Signed)
 Me to Sheena Taylor, CMA Regarding result: JARDIANCE 25 MG TABS tablet [Pharmacy Med Name: JARDIANCE 25 MG TABLET]      06/27/23  9:35 AM Hi, before I do this prior auth I wanted to ask about the jardiance. The last note I see says: "Pt states she does not want to go back on Jardiance and wants to stay on her lasix regimen." She hasn't gotten the jardiance filled since 09/2022 for 30 days and that was the 10mg . This "refill" is for jardiance 25mg , which I dont see where she has gotten before. Then at her last visit on 06/24/23 it said" Will change to Lasix 40 mg a.m., and add Farxiga 10 mg in AM." I see this request says: "JARDIANCE 25 MG TABS tablet. Changed from: dapagliflozin propanediol (FARXIGA) 10 MG TABS tablet." I ran a test claim for farxiga 10mg  and the copay came back 47.00 with no prior authorization required. I can call [facility] and have them fill the farxiga if the decision is to stay with farxiga.   06/26/23 10:52 AM Sheena Taylor, CMA routed this conversation to Rx Prior Auth Team [place], [place]     06/26/23 10:52 AM Note Pharmacy comment: Alternative Requested:THE PRESCRIBED MEDICATION IS NOT COVERED BY INSURANCE. PLEASE CONSIDER CHANGING TO ONE OF THE SUGGESTED COVERED ALTERNATIVES.      Interface, Surescripts Out to [employer] Refills Regarding result: JARDIANCE 25 MG TABS tablet [Pharmacy Med Name: JARDIANCE 25 MG TABLET]  SI   06/25/23 12:38 PM  Pharmacy requested follow up on 06/25/2023.

## 2023-07-24 ENCOUNTER — Encounter: Payer: Self-pay | Admitting: Cardiology

## 2023-07-24 ENCOUNTER — Ambulatory Visit: Attending: Cardiology | Admitting: Cardiology

## 2023-07-24 VITALS — BP 122/68 | HR 108 | Resp 16 | Ht 64.0 in | Wt 167.6 lb

## 2023-07-24 DIAGNOSIS — I4819 Other persistent atrial fibrillation: Secondary | ICD-10-CM

## 2023-07-24 DIAGNOSIS — I5032 Chronic diastolic (congestive) heart failure: Secondary | ICD-10-CM | POA: Diagnosis not present

## 2023-07-24 MED ORDER — FUROSEMIDE 40 MG PO TABS: 40.0000 mg | ORAL_TABLET | ORAL | 3 refills | Status: AC | PRN

## 2023-07-24 MED ORDER — HYDROCHLOROTHIAZIDE 12.5 MG PO TABS: 12.5000 mg | ORAL_TABLET | Freq: Every day | ORAL | 3 refills | Status: AC

## 2023-07-24 MED ORDER — DAPAGLIFLOZIN PROPANEDIOL 10 MG PO TABS: 10.0000 mg | ORAL_TABLET | Freq: Every day | ORAL | 2 refills | Status: DC

## 2023-07-24 NOTE — Progress Notes (Signed)
 Cardiology Office Note:  .   Date:  07/24/2023  ID:  Sheena Taylor, DOB [birth year], MRN [id number] PCP: Creola Corn, MD  [facility] Providers Cardiologist:  Truett Mainland, MD PCP: Creola Corn, MD  Chief Complaint  Patient presents with   Chronic heart failure with preserved ejection fraction   Follow-up      History of Present Illness: .    Sheena Taylor is a 89 y.o. female with hypertension, persistent Afib, HFpEF, cardiac tamponade requiring emergency pericardiocentesis in 05/2022, recurrent bilateral pleural effusions requiring Rt sided thoracentesis in 05/2022.   Continues to feel fatigued and tired, no worsening dyspnea or leg edema.  Stressed with her husband's illness recently.  Vitals:   07/24/23 0826  BP: 122/68  Pulse: (!) 108  Resp: 16  SpO2: 96%      ROS:  Review of Systems  Cardiovascular:  Positive for dyspnea on exertion. Negative for chest pain, leg swelling, palpitations and syncope.     Studies Reviewed: Marland Kitchen        EKG 06/24/2023: Atrial fibrillation with rapid ventricular response Left axis deviation When compared with ECG of 12-Mar-2023 13:17, No significant change was found    Independently interpreted 04/2023: Chol 211, TG 116, HDL 70, LDL 118 HbA1C 5.3% Hb 13.6 Cr 1.1 TSH 1.67  03/27/2023: NT-ProBNP 1118  Echocardiogram 03/13/2023: 1. Left ventricular ejection fraction, by estimation, is 50%. The left  ventricle has low normal function. The left ventricle demonstrates global  hypokinesis. Left ventricular diastolic parameters are indeterminate.   2. Right ventricular systolic function is normal. The right ventricular  size is normal. There is normal pulmonary artery systolic pressure.   3. Left atrial size was severely dilated.   4. Right atrial size was severely dilated.   5. The mitral valve is normal in structure. No evidence of mitral valve  regurgitation. No evidence of mitral stenosis.   6. The  tricuspid valve is abnormal.   7. The aortic valve is tricuspid. There is mild calcification of the  aortic valve. There is mild thickening of the aortic valve. Aortic valve  regurgitation is not visualized. No aortic stenosis is present.   8. The inferior vena cava is normal in size with greater than 50%  respiratory variability, suggesting right atrial pressure of 3 mmHg.   Risk Assessment/Calculations:    CHA2DS2-VASc Score = 5  This indicates a 7.2% annual risk of stroke. The patient's score is based upon: CHF History: 1 HTN History: 1 Diabetes History: 0 Stroke History: 0 Vascular Disease History: 0 Age Score: 2 Gender Score: 1     Physical Exam:   Physical Exam Vitals and nursing note reviewed.  Constitutional:      General: She is not in acute distress. Neck:     Vascular: No JVD.  Cardiovascular:     Rate and Rhythm: Tachycardia present. Rhythm irregular.     Heart sounds: Normal heart sounds. No murmur heard. Pulmonary:     Effort: Pulmonary effort is normal.     Breath sounds: Normal breath sounds. No wheezing or rales.  Musculoskeletal:     Right lower leg: No edema.     Left lower leg: No edema.  Neurological:     Coordination: Abnormal coordination: HFpEF:.      VISIT DIAGNOSES:   ICD-10-CM   1. Chronic heart failure with preserved ejection fraction (HCC)  I50.32 Basic metabolic panel    Pro b natriuretic peptide (BNP)    2. Persistent atrial  fibrillation (HCC)  I48.19 Basic metabolic panel    Pro b natriuretic peptide (BNP)         ASSESSMENT AND PLAN: .    Sheena Taylor is a 89 y.o. female with hypertension, persistent Afib, HFpEF, cardiac tamponade requiring emergency pericardiocentesis in 05/2022, recurrent bilateral pleural effusions requiring Rt sided thoracentesis in 05/2022.    HFpEF: Appears euvolemic on exam. Continue Farxiga 10 mg. I will prefer her not to be on full dose hydrochlorothiazide, Farxiga, and Lasix  daily. Reduce hydrochlorothiazide from 25 mg daily to 12.5 mg daily. Change Lasix from daily, to as needed use only. Check BMP and proBNP today.   Persistent A-fib: Persistent, likely permanent Afib. Rate reasonably well controlled. Continue metoprolol succinate 50 mg daily, diltiazem 120 mg daily. CHA2DS2VASc score at least 5 annual stroke risk 7.2%.   Continue Xarelto 15 mg daily.     No orders of the defined types were placed in this encounter.    F/u in 3 months  Signed, Elder Negus, MD

## 2023-07-24 NOTE — Patient Instructions (Signed)
 Medication Instructions:  DECREASE Hydrochlorothiazide to 12.5 mg   CHANGE Lasix to daily as needed   Refilled Farxiga   *If you need a refill on your cardiac medications before your next appointment, please call your pharmacy*  Lab Work: BMP ProBNP  If you have labs (blood work) drawn today and your tests are completely normal, you will receive your results only by: MyChart Message (if you have MyChart) OR A paper copy in the mail If you have any lab test that is abnormal or we need to change your treatment, we will call you to review the results.   Follow-Up: At [facility], you and your health needs are our priority.  As part of our continuing mission to provide you with exceptional heart care, our providers are all part of one team.  This team includes your primary Cardiologist (physician) and Advanced Practice Providers or APPs (Physician Assistants and Nurse Practitioners) who all work together to provide you with the care you need, when you need it.  Your next appointment:   3 month(s)  Provider:   Elder Negus, MD       Other Instructions       1st Floor: - Lobby - Registration  - Pharmacy  - Lab - Cafe  2nd Floor: - PV Lab - Diagnostic Testing (echo, CT, nuclear med)  3rd Floor: - Vacant  4th Floor: - TCTS (cardiothoracic surgery) - AFib Clinic - Structural Heart Clinic - Vascular Surgery  - Vascular Ultrasound  5th Floor: - HeartCare Cardiology (general and EP) - Clinical Pharmacy for coumadin, hypertension, lipid, weight-loss medications, and med management appointments    Valet parking services will be available as well.

## 2023-08-20 ENCOUNTER — Encounter: Payer: Self-pay | Admitting: Cardiology

## 2023-08-20 LAB — BASIC METABOLIC PANEL WITH GFR
BUN/Creatinine Ratio: 16 (ref 12–28)
BUN: 14 mg/dL (ref 8–27)
CO2: 24 mmol/L (ref 20–29)
Calcium: 9.6 mg/dL (ref 8.7–10.3)
Chloride: 105 mmol/L (ref 96–106)
Creatinine, Ser: 0.89 mg/dL (ref 0.57–1.00)
Glucose: 76 mg/dL (ref 70–99)
Potassium: 4.4 mmol/L (ref 3.5–5.2)
Sodium: 144 mmol/L (ref 134–144)
eGFR: 63 mL/min/{1.73_m2} (ref >59)

## 2023-08-20 LAB — PRO B NATRIURETIC PEPTIDE: NT-Pro BNP: 809 pg/mL — ABNORMAL HIGH (ref 0–738)

## 2023-09-19 ENCOUNTER — Other Ambulatory Visit: Payer: Self-pay | Admitting: Internal Medicine

## 2023-09-19 DIAGNOSIS — Z1231 Encounter for screening mammogram for malignant neoplasm of breast: Secondary | ICD-10-CM

## 2023-10-09 ENCOUNTER — Encounter: Payer: Self-pay | Admitting: Internal Medicine

## 2023-10-21 ENCOUNTER — Ambulatory Visit

## 2023-10-23 ENCOUNTER — Encounter: Payer: Self-pay | Admitting: Cardiology

## 2023-10-23 ENCOUNTER — Telehealth: Payer: Self-pay

## 2023-10-23 ENCOUNTER — Ambulatory Visit: Attending: Cardiology | Admitting: Cardiology

## 2023-10-23 VITALS — BP 110/54 | HR 74 | Ht 64.0 in | Wt 164.0 lb

## 2023-10-23 DIAGNOSIS — I4811 Longstanding persistent atrial fibrillation: Secondary | ICD-10-CM

## 2023-10-23 DIAGNOSIS — I5032 Chronic diastolic (congestive) heart failure: Secondary | ICD-10-CM | POA: Diagnosis not present

## 2023-10-23 DIAGNOSIS — E782 Mixed hyperlipidemia: Secondary | ICD-10-CM | POA: Insufficient documentation

## 2023-10-23 MED ORDER — DAPAGLIFLOZIN PROPANEDIOL 10 MG PO TABS
10.0000 mg | ORAL_TABLET | Freq: Every day | ORAL | 3 refills | Status: AC
Start: 2023-10-23 — End: ?

## 2023-10-23 MED ORDER — DAPAGLIFLOZIN PROPANEDIOL 10 MG PO TABS: 10.0000 mg | ORAL_TABLET | Freq: Every day | ORAL | 0 refills | Status: AC

## 2023-10-23 MED ORDER — DILTIAZEM HCL ER COATED BEADS 120 MG PO CP24: 120.0000 mg | ORAL_CAPSULE | Freq: Every day | ORAL | 3 refills | Status: DC

## 2023-10-23 MED ORDER — DAPAGLIFLOZIN PROPANEDIOL 10 MG PO TABS: 10.0000 mg | ORAL_TABLET | Freq: Every day | ORAL | 0 refills | Status: DC

## 2023-10-23 MED ORDER — ROSUVASTATIN CALCIUM 10 MG PO TABS: 10.0000 mg | ORAL_TABLET | Freq: Every day | ORAL | 3 refills | Status: AC

## 2023-10-23 NOTE — Progress Notes (Signed)
 Cardiology Office Note:  .   Date:  10/23/2023  ID:  Sheena Taylor, DOB 03-02-1935, MRN [id number] PCP: Onita Rush, MD  [facility] Providers Cardiologist:  Newman Lawrence, MD PCP: Onita Rush, MD  Chief Complaint  Patient presents with   Hypertension   Congestive Heart Failure   Atrial Fibrillation      History of Present Illness: .    Sheena Taylor is a 89 y.o. female with hypertension, persistent Afib, HFpEF, cardiac tamponade requiring emergency pericardiocentesis in 05/2022, recurrent bilateral pleural effusions requiring Rt sided thoracentesis in 05/2022.   Patient is here with her friend Vina.  She continues to have exertional dyspnea symptoms, gets short of breath walking from room to the dining where she lives.  It appears that she double Farxiga  samples, but not started on prescription.  Reviewed recent labs with patient, did this for her.  Vitals:   10/23/23 0900  BP: (!) 110/54  Pulse: 74  SpO2: 96%       ROS:  Review of Systems  Cardiovascular:  Positive for dyspnea on exertion. Negative for chest pain, leg swelling, palpitations and syncope.     Studies Reviewed: SABRA        EKG 06/24/2023: Atrial fibrillation with rapid ventricular response Left axis deviation When compared with ECG of 12-Mar-2023 13:17, No significant change was found  Labs 08/2023: Hb 13.6 Cr 0.89 PropBNP 809  04/2023: Chol 211, TG 116, HDL 70, LDL 118 HbA1C 5.3% Hb 13.6 Cr 1.1 TSH 1.67  03/27/2023: NT-ProBNP 1118  Echocardiogram 03/13/2023: 1. Left ventricular ejection fraction, by estimation, is 50%. The left  ventricle has low normal function. The left ventricle demonstrates global  hypokinesis. Left ventricular diastolic parameters are indeterminate.   2. Right ventricular systolic function is normal. The right ventricular  size is normal. There is normal pulmonary artery systolic pressure.   3. Left atrial size was severely dilated.   4.  Right atrial size was severely dilated.   5. The mitral valve is normal in structure. No evidence of mitral valve  regurgitation. No evidence of mitral stenosis.   6. The tricuspid valve is abnormal.   7. The aortic valve is tricuspid. There is mild calcification of the  aortic valve. There is mild thickening of the aortic valve. Aortic valve  regurgitation is not visualized. No aortic stenosis is present.   8. The inferior vena cava is normal in size with greater than 50%  respiratory variability, suggesting right atrial pressure of 3 mmHg.   Risk Assessment/Calculations:    CHA2DS2-VASc Score = 5  This indicates a 7.2% annual risk of stroke. The patient's score is based upon: CHF History: 1 HTN History: 1 Diabetes History: 0 Stroke History: 0 Vascular Disease History: 0 Age Score: 2 Gender Score: 1     Physical Exam:   Physical Exam Vitals and nursing note reviewed.  Constitutional:      General: She is not in acute distress. Neck:     Vascular: No JVD.  Cardiovascular:     Rate and Rhythm: Normal rate. Rhythm irregular.     Heart sounds: Normal heart sounds. No murmur heard. Pulmonary:     Effort: Pulmonary effort is normal.     Breath sounds: Normal breath sounds. No wheezing or rales.  Musculoskeletal:     Right lower leg: No edema.     Left lower leg: No edema.  Neurological:     Coordination: Abnormal coordination: HFpEF:.  VISIT DIAGNOSES:   ICD-10-CM   1. Chronic heart failure with preserved ejection fraction (HCC)  I50.32     2. Longstanding persistent atrial fibrillation (HCC)  I48.11     3. Mixed hyperlipidemia  E78.2           ASSESSMENT AND PLAN: .    Sheena Taylor is a 89 y.o. female with hypertension, persistent Afib, HFpEF, cardiac tamponade requiring emergency pericardiocentesis in 05/2022, recurrent bilateral pleural effusions requiring Rt sided thoracentesis in 05/2022.    HFpEF: Appears family euvolemic on exam at rest,  but does have NYHA class II-III dyspnea symptoms. proBNP lower in May than January 2025. It appears that she is not taking Farxiga  10 mg daily, start the same. With initiation of Farxiga , encouraged her to take Lasix  only as needed for leg edema and not daily. Reasonable to continue hydrochlorothiazide  12.5 mg daily.   Persistent A-fib: Persistent, likely permanent Afib. Rate reasonably well controlled. Continue metoprolol  succinate 50 mg daily, diltiazem  120 mg daily. CHA2DS2VASc score at least 5 annual stroke risk 7.2%.   Continue Xarelto  15 mg daily.    Mixed hyperlipidemia: LDL >100.  Will change pravastatin  to Crestor  20 mg daily.   Meds ordered this encounter  Medications   diltiazem  (CARDIZEM  CD) 120 MG 24 hr capsule    Sig: Take 1 capsule (120 mg total) by mouth daily.    Dispense:  90 capsule    Refill:  3   rosuvastatin  (CRESTOR ) 10 MG tablet    Sig: Take 1 tablet (10 mg total) by mouth daily.    Dispense:  90 tablet    Refill:  3   dapagliflozin  propanediol (FARXIGA ) 10 MG TABS tablet    Sig: Take 1 tablet (10 mg total) by mouth daily before breakfast.    Dispense:  90 tablet    Refill:  3     F/u in 3 months  Signed, Newman JINNY Lawrence, MD

## 2023-10-23 NOTE — Patient Instructions (Addendum)
 Medication Instructions:  STOP Pravachol  RESTART Farxiga  10 mg daily START Crestor  10 mg daily *If you need a refill on your cardiac medications before your next appointment, please call your pharmacy*  Follow-Up: At [facility], you and your health needs are our priority.  As part of our continuing mission to provide you with exceptional heart care, our providers are all part of one team.  This team includes your primary Cardiologist (physician) and Advanced Practice Providers or APPs (Physician Assistants and Nurse Practitioners) who all work together to provide you with the care you need, when you need it.  Your next appointment:   3 months  Provider:   Newman JINNY Lawrence, MD    We recommend signing up for the patient portal called MyChart.  Sign up information is provided on this After Visit Summary.  MyChart is used to connect with patients for Virtual Visits (Telemedicine).  Patients are able to view lab/test results, encounter notes, upcoming appointments, etc.  Non-urgent messages can be sent to your provider as well.   To learn more about what you can do with MyChart, go to [email or web address].

## 2023-10-23 NOTE — Addendum Note (Signed)
 Addended by: TAFFY LOVEY KIDD on: 10/23/2023 09:58 AM   Modules accepted: Orders

## 2023-10-23 NOTE — Telephone Encounter (Signed)
 Medication name/dosage: Samples List: Farxiga  10 mg  Administration instructions: 1 tablet daily  Reason for samples: Reason for samples: new start (restarting)  Ordering provider:Dr. Elmira  *Once above information entered, route the phone encounter to CV DIV MAG ST SAMPLES and send Teams message to team member assigned to Samples for the day.

## 2023-11-30 ENCOUNTER — Other Ambulatory Visit: Payer: Self-pay | Admitting: Cardiology

## 2023-12-24 ENCOUNTER — Other Ambulatory Visit: Payer: Self-pay | Admitting: Cardiology

## 2023-12-31 ENCOUNTER — Other Ambulatory Visit: Payer: Self-pay | Admitting: Cardiology

## 2023-12-31 ENCOUNTER — Other Ambulatory Visit: Payer: Self-pay

## 2023-12-31 MED ORDER — RIVAROXABAN 15 MG PO TABS: 15.0000 mg | ORAL_TABLET | Freq: Every day | ORAL | 3 refills | Status: AC

## 2023-12-31 NOTE — Telephone Encounter (Signed)
 Prescription refill request for Xarelto  received.  Indication:afib Last office visit:7/25 Weight:74.4  kg Age:89 Scr:0.89  12/24 CrCl:53.29  ml/min  Prescription refilled

## 2023-12-31 NOTE — Telephone Encounter (Signed)
 Pt last saw Dr Elmira 10/23/23, last labs 08/19/23 Creat 0.89, age 89, weight 74.4kg, CrCl 53.29, based on CrCl pt is not on appropriate dosage of Xarelto .  Please advise if dosage change is appropriate. Thanks

## 2024-01-27 ENCOUNTER — Encounter: Payer: Self-pay | Admitting: Cardiology

## 2024-01-27 ENCOUNTER — Ambulatory Visit: Attending: Cardiology | Admitting: Cardiology

## 2024-01-27 VITALS — BP 127/78 | HR 65 | Ht 64.0 in | Wt 175.8 lb

## 2024-01-27 DIAGNOSIS — I5032 Chronic diastolic (congestive) heart failure: Secondary | ICD-10-CM

## 2024-01-27 DIAGNOSIS — I4819 Other persistent atrial fibrillation: Secondary | ICD-10-CM | POA: Diagnosis not present

## 2024-01-27 NOTE — Progress Notes (Signed)
 Cardiology Office Note:  .   Date:  01/27/2024  ID:  Sheena Taylor, DOB [birth year], MRN [id number] PCP: Onita Rush, MD  [facility] Providers Cardiologist:  Newman Lawrence, MD PCP: Onita Rush, MD  Chief Complaint  Patient presents with   HFpEF      History of Present Illness: .    Sheena Taylor is a 89 y.o. female with hypertension, persistent Afib, HFpEF, cardiac tamponade requiring emergency pericardiocentesis in 05/2022, recurrent bilateral pleural effusions requiring Rt sided thoracentesis in 05/2022.   Patient is doing fairly well.  She is in independent living facility at [facility], where her husband also lives in an assisted living section.  She admits that she has been eating little too well, but denies any leg edema, orthopnea, PND.  Exertional dyspnea is stable and unchanged.  She is compliant with medical therapy.  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  Vitals:   01/27/24 0930  BP: 127/78  Pulse: 65  SpO2: 98%      ROS:  Review of Systems  Cardiovascular:  Positive for dyspnea on exertion. Negative for chest pain, leg swelling, palpitations and syncope.     Studies Reviewed: SABRA        EKG 06/24/2023: Atrial fibrillation with rapid ventricular response Left axis deviation When compared with ECG of 12-Mar-2023 13:17, No significant change was found  Labs 08/2023: Hb 13.6 Cr 0.89 PropBNP 809  04/2023: Chol 211, TG 116, HDL 70, LDL 118 HbA1C 5.3% Hb 13.6 Cr 1.1 TSH 1.67  03/27/2023: NT-ProBNP 1118  Echocardiogram 03/13/2023: 1. Left ventricular ejection fraction, by estimation, is 50%. The left  ventricle has low normal function. The left ventricle demonstrates global  hypokinesis. Left ventricular diastolic parameters are indeterminate.   2. Right ventricular systolic function is normal. The right ventricular  size is normal. There is normal pulmonary artery systolic pressure.   3. Left  atrial size was severely dilated.   4. Right atrial size was severely dilated.   5. The mitral valve is normal in structure. No evidence of mitral valve  regurgitation. No evidence of mitral stenosis.   6. The tricuspid valve is abnormal.   7. The aortic valve is tricuspid. There is mild calcification of the  aortic valve. There is mild thickening of the aortic valve. Aortic valve  regurgitation is not visualized. No aortic stenosis is present.   8. The inferior vena cava is normal in size with greater than 50%  respiratory variability, suggesting right atrial pressure of 3 mmHg.   Risk Assessment/Calculations:    CHA2DS2-VASc Score = 5  This indicates a 7.2% annual risk of stroke. The patient's score is based upon: CHF History: 1 HTN History: 1 Diabetes History: 0 Stroke History: 0 Vascular Disease History: 0 Age Score: 2 Gender Score: 1     Physical Exam:   Physical Exam Vitals and nursing note reviewed.  Constitutional:      General: She is not in acute distress. Neck:     Vascular: No JVD.  Cardiovascular:     Rate and Rhythm: Normal rate. Rhythm irregular.     Heart sounds: Normal heart sounds. No murmur heard. Pulmonary:     Effort: Pulmonary effort is normal.     Breath sounds: Normal breath sounds. No wheezing or rales.  Musculoskeletal:     Right lower leg: No edema.     Left lower leg: No edema.  Neurological:     Coordination:  Abnormal coordination: HFpEF:.      VISIT DIAGNOSES:   ICD-10-CM   1. Persistent atrial fibrillation (HCC)  I48.19     2. Chronic heart failure with preserved ejection fraction (HFpEF) (HCC)  I50.32          ASSESSMENT AND PLAN: .    Sheena Taylor is a 89 y.o. female with hypertension, persistent Afib, HFpEF, cardiac tamponade requiring emergency pericardiocentesis in 05/2022, recurrent bilateral pleural effusions requiring Rt sided thoracentesis in 05/2022.    HFpEF: Currently was mild.  Recent weight gain likely.   Intake rather than fluid retention. Continue Farxiga  10 mg daily, hydrochlorothiazide  12.5 mg daily, Lasix  as needed..   Persistent A-fib: Persistent, likely permanent Afib. Rate reasonably well controlled. Continue metoprolol  succinate 50 mg daily, diltiazem  120 mg daily. Continue Xarelto  15 mg daily.    Mixed hyperlipidemia: Continue Crestor  20 mg daily.     F/u in 3 months  Signed, Newman JINNY Lawrence, MD

## 2024-01-27 NOTE — Patient Instructions (Signed)
